# Patient Record
Sex: Male | Born: 1940 | Race: White | Hispanic: No | Marital: Married | Smoking: Never smoker
Health system: Southern US, Community
[De-identification: ages and names within clinical notes are randomized; demographics above are authoritative.]

## PROBLEM LIST (undated history)

## (undated) DIAGNOSIS — R0989 Other specified symptoms and signs involving the circulatory and respiratory systems: Secondary | ICD-10-CM

## (undated) DIAGNOSIS — K219 Gastro-esophageal reflux disease without esophagitis: Secondary | ICD-10-CM

## (undated) DIAGNOSIS — I2510 Atherosclerotic heart disease of native coronary artery without angina pectoris: Secondary | ICD-10-CM

## (undated) DIAGNOSIS — E785 Hyperlipidemia, unspecified: Secondary | ICD-10-CM

## (undated) DIAGNOSIS — M1990 Unspecified osteoarthritis, unspecified site: Secondary | ICD-10-CM

## (undated) DIAGNOSIS — I10 Essential (primary) hypertension: Secondary | ICD-10-CM

## (undated) HISTORY — DX: Other specified symptoms and signs involving the circulatory and respiratory systems: R09.89

## (undated) HISTORY — PX: COLONOSCOPY: SHX174

## (undated) HISTORY — PX: CARDIAC CATHETERIZATION: SHX172

## (undated) HISTORY — DX: Atherosclerotic heart disease of native coronary artery without angina pectoris: I25.10

## (undated) HISTORY — PX: JOINT REPLACEMENT: SHX530

## (undated) HISTORY — PX: TONSILLECTOMY: SUR1361

## (undated) HISTORY — DX: Essential (primary) hypertension: I10

## (undated) HISTORY — DX: Hyperlipidemia, unspecified: E78.5

## (undated) NOTE — *Deleted (*Deleted)
***In Progress*** PCP:  Tisovec, Adelfa Koh, MD           Cardiologist:  No primary care provider on file. Primary HF: Dr. Gala Romney  HPI:  [name] a 28 y.o.malewith history of CAD, multiple angioplasties to the RCA dating back to 1983 followed by CABG in 2012, HTN, and hyperlipidemia. 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  He was hospitalized in November 2011 because of recurrent chest pain. He underwent drug-eluting stent to the proximal RCA July 24, 2010. Ejection fraction was 65%. He also had 99% stenosis in the distal PDA with left to right collaterals and nonobstructive disease in the left coronary tree.  He was hospitalized in October 2012 with [place]. Found to have progressive CAD. Underwent Coronary artery bypass grafting x4 (left internal mammary artery to LAD, saphenous vein graft to circumflex marginal, saphenous vein graft to posterior descending branch of the distal right coronary, saphenous vein graft to the posterolateral branch of the distal right coronary artery).   Had recurrent CP in 8/20 underwent cath which showed stable anatomy with no targets for PCI.  He is s/p R hip replacement on 04/22/20. He was discharged on 04/23/20. On 04/25/20, he had AMS and was sent to [facility] ED. HS Trop 111-->1421 -> 17,390. It was unclear if this was demand ischemia. Had LHC on 04/28/20 with no targets for revascularization and medical management. ECHO showed EF 40-45%.  He recently returned for follow up with Dr. Gala Romney on 06/30/20. He reported feeling good with no CP or SOB. He was still struggling with pain from his hernia repair and knee replacement. Did not have edema, orthopnea or PND. His losartan was switched to Entresto and metoprolol tartrate was decreased due to bradycardia. (BP 190/60, HR 55)  - Check BMET - Plan A: Increase spiro, switch metop to coreg - Plan B: SGLT2 - Plan C: stop amlodipine when BP is more  controlled - [employer] - F/u: 1 week labs if increasing spiro, after TG if SGLT2  Today he returns to HF clinic for pharmacist medication titration. At last visit with MD ***.   Overall feeling ***. Dizziness, lightheadedness, fatigue:  Chest pain or palpitations:  How is your breathing?: *** SOB: Able to complete all ADLs. Activity level ***  Weight at home pounds. Takes furosemide/torsemide/bumex *** mg *** daily.  LEE PND/Orthopnea  Appetite *** Low-salt diet:   Physical Exam Cost/affordability of meds - [employer]  HF Medications: Metoprolol tartrate 25 mg BID Entresto 97/103 mg BID  Spironolactone 12.5 mg daily  - Amlodipine 10 mg daily  Has the patient been experiencing any side effects to the medications prescribed?  {YES NO:22349}  Does the patient have any problems obtaining medications due to transportation or finances?   {YES NO:22349}  Understanding of regimen: {excellent/good/fair/poor:19665} Understanding of indications: {excellent/good/fair/poor:19665} Potential of compliance: {excellent/good/fair/poor:19665} Patient understands to avoid NSAIDs. Patient understands to avoid decongestants.    Pertinent Lab Values: . Serum creatinine ***, BUN ***, Potassium ***, Sodium ***, BNP ***, Magnesium ***, Digoxin ***   Vital Signs: . Weight: *** (last clinic weight: ***) . Blood pressure: ***  . Heart rate: ***   Assessment: 1. CAD S/p CABG 2012 - Cath 04/2020 with stable revascularization - No s/s angina - Continue statin and ASA. Decrease b-blocker due to bradycardia  2. 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  Mild volume overload - Continue Entresto 97/103 mg BID - Continue*** spironolactone ***12.5 mg daily - Continue*** metoprolol tartrate ***25 mg BID - F/u with PharmD in 2-3 weeks to consider addition of SGLT2i - Wear compression hose  3. HTN - Blood pressure very high  (missed meds this am) - Changes as above  4. Carotid stenosis - Stable 40-59% disease bilateral carotids. CCA with >50% hemodynamically significant plaque. -Remains asymptomatic.Follows with Dr. Myra Gianotti  5. HLD - Continue atorvastatin 80 mg daily  - LDL 66 in 8/21. Followed by Dr. Wylene Simmer   Plan: 1) Medication changes: Based on clinical presentation, vital signs and recent labs will *** 2) Labs: *** 3) Follow-up: ***   Karle Plumber, PharmD, BCPS, BCCP, CPP Heart Failure Clinic Pharmacist [phone]

---

## 1998-08-12 ENCOUNTER — Encounter: Payer: Self-pay | Admitting: *Deleted

## 1998-08-12 ENCOUNTER — Ambulatory Visit (HOSPITAL_COMMUNITY): Admission: RE | Admit: 1998-08-12 | Discharge: 1998-08-13 | Payer: Self-pay | Admitting: *Deleted

## 2004-07-22 ENCOUNTER — Ambulatory Visit: Payer: Self-pay | Admitting: *Deleted

## 2004-09-18 ENCOUNTER — Ambulatory Visit: Payer: Self-pay

## 2004-09-28 ENCOUNTER — Ambulatory Visit: Payer: Self-pay

## 2005-02-19 ENCOUNTER — Ambulatory Visit: Payer: Self-pay | Admitting: *Deleted

## 2005-02-26 ENCOUNTER — Ambulatory Visit: Payer: Self-pay | Admitting: Cardiology

## 2005-03-22 ENCOUNTER — Ambulatory Visit: Payer: Self-pay | Admitting: *Deleted

## 2005-06-18 ENCOUNTER — Ambulatory Visit: Payer: Self-pay

## 2005-08-20 ENCOUNTER — Ambulatory Visit: Payer: Self-pay | Admitting: Cardiology

## 2005-11-08 ENCOUNTER — Encounter: Admission: RE | Admit: 2005-11-08 | Discharge: 2005-11-08 | Payer: Self-pay | Admitting: Orthopedic Surgery

## 2005-11-26 ENCOUNTER — Ambulatory Visit: Payer: Self-pay

## 2005-11-29 ENCOUNTER — Ambulatory Visit: Payer: Self-pay | Admitting: Internal Medicine

## 2006-01-05 ENCOUNTER — Inpatient Hospital Stay (HOSPITAL_COMMUNITY): Admission: RE | Admit: 2006-01-05 | Discharge: 2006-01-08 | Payer: Self-pay | Admitting: Orthopedic Surgery

## 2006-05-13 ENCOUNTER — Ambulatory Visit: Payer: Self-pay | Admitting: *Deleted

## 2006-05-23 ENCOUNTER — Ambulatory Visit: Payer: Self-pay | Admitting: *Deleted

## 2006-07-18 ENCOUNTER — Ambulatory Visit: Payer: Self-pay

## 2006-09-13 HISTORY — PX: OTHER SURGICAL HISTORY: SHX169

## 2006-11-11 ENCOUNTER — Ambulatory Visit: Payer: Self-pay | Admitting: *Deleted

## 2006-11-11 LAB — CONVERTED CEMR LAB
ALT: 25 units/L (ref 0–40)
AST: 28 units/L (ref 0–37)
Albumin: 3.8 g/dL (ref 3.5–5.2)
Alkaline Phosphatase: 50 units/L (ref 39–117)
BUN: 11 mg/dL (ref 6–23)
Bilirubin, Direct: 0.3 mg/dL (ref 0.0–0.3)
CO2: 29 meq/L (ref 19–32)
Calcium: 9.1 mg/dL (ref 8.4–10.5)
Chloride: 102 meq/L (ref 96–112)
Cholesterol: 148 mg/dL (ref 0–200)
Creatinine, Ser: 1 mg/dL (ref 0.4–1.5)
GFR calc Af Amer: 97 mL/min
GFR calc non Af Amer: 80 mL/min
Glucose, Bld: 78 mg/dL (ref 70–99)
HDL: 53.5 mg/dL (ref >39.0)
LDL Cholesterol: 85 mg/dL (ref 0–99)
Potassium: 4.3 meq/L (ref 3.5–5.1)
Sodium: 137 meq/L (ref 135–145)
Total Bilirubin: 1.4 mg/dL — ABNORMAL HIGH (ref 0.3–1.2)
Total CHOL/HDL Ratio: 2.8
Total Protein: 6.4 g/dL (ref 6.0–8.3)
Triglycerides: 49 mg/dL (ref 0–149)
VLDL: 10 mg/dL (ref 0–40)

## 2006-11-18 ENCOUNTER — Ambulatory Visit: Payer: Self-pay | Admitting: *Deleted

## 2006-11-28 ENCOUNTER — Ambulatory Visit: Payer: Self-pay | Admitting: Internal Medicine

## 2007-01-23 ENCOUNTER — Ambulatory Visit: Payer: Self-pay | Admitting: *Deleted

## 2007-01-23 LAB — CONVERTED CEMR LAB
ALT: 25 units/L (ref 0–40)
AST: 26 units/L (ref 0–37)
Albumin: 3.9 g/dL (ref 3.5–5.2)
Alkaline Phosphatase: 38 units/L — ABNORMAL LOW (ref 39–117)
BUN: 9 mg/dL (ref 6–23)
Bilirubin, Direct: 0.2 mg/dL (ref 0.0–0.3)
CO2: 29 meq/L (ref 19–32)
Calcium: 9.3 mg/dL (ref 8.4–10.5)
Chloride: 106 meq/L (ref 96–112)
Cholesterol: 135 mg/dL (ref 0–200)
Creatinine, Ser: 0.8 mg/dL (ref 0.4–1.5)
GFR calc Af Amer: 125 mL/min
GFR calc non Af Amer: 103 mL/min
Glucose, Bld: 114 mg/dL — ABNORMAL HIGH (ref 70–99)
HDL: 48 mg/dL (ref >39.0)
LDL Cholesterol: 68 mg/dL (ref 0–99)
Potassium: 4.2 meq/L (ref 3.5–5.1)
Sodium: 140 meq/L (ref 135–145)
Total Bilirubin: 1.5 mg/dL — ABNORMAL HIGH (ref 0.3–1.2)
Total CHOL/HDL Ratio: 2.8
Total Protein: 6.3 g/dL (ref 6.0–8.3)
Triglycerides: 96 mg/dL (ref 0–149)
VLDL: 19 mg/dL (ref 0–40)

## 2007-01-30 ENCOUNTER — Ambulatory Visit: Payer: Self-pay | Admitting: Internal Medicine

## 2007-07-21 ENCOUNTER — Ambulatory Visit: Payer: Self-pay | Admitting: Internal Medicine

## 2007-07-21 LAB — CONVERTED CEMR LAB
ALT: 25 units/L (ref 0–53)
AST: 29 units/L (ref 0–37)
Albumin: 3.8 g/dL (ref 3.5–5.2)
Alkaline Phosphatase: 46 units/L (ref 39–117)
Bilirubin, Direct: 0.3 mg/dL (ref 0.0–0.3)
Cholesterol: 117 mg/dL (ref 0–200)
HDL: 51 mg/dL (ref >39.0)
LDL Cholesterol: 54 mg/dL (ref 0–99)
Total Bilirubin: 1.9 mg/dL — ABNORMAL HIGH (ref 0.3–1.2)
Total CHOL/HDL Ratio: 2.3
Total Protein: 6.3 g/dL (ref 6.0–8.3)
Triglycerides: 59 mg/dL (ref 0–149)
VLDL: 12 mg/dL (ref 0–40)

## 2007-07-31 ENCOUNTER — Ambulatory Visit: Payer: Self-pay | Admitting: Cardiology

## 2007-07-31 ENCOUNTER — Ambulatory Visit: Payer: Self-pay

## 2007-08-18 ENCOUNTER — Ambulatory Visit: Payer: Self-pay | Admitting: Internal Medicine

## 2008-03-04 ENCOUNTER — Ambulatory Visit: Payer: Self-pay | Admitting: Internal Medicine

## 2008-09-15 ENCOUNTER — Encounter: Payer: Self-pay | Admitting: Internal Medicine

## 2008-09-16 ENCOUNTER — Ambulatory Visit: Payer: Self-pay | Admitting: Internal Medicine

## 2008-09-16 LAB — CONVERTED CEMR LAB
ALT: 28 units/L (ref 0–53)
AST: 28 units/L (ref 0–37)
Albumin: 4.1 g/dL (ref 3.5–5.2)
Alkaline Phosphatase: 55 units/L (ref 39–117)
BUN: 13 mg/dL (ref 6–23)
Bilirubin, Direct: 0.2 mg/dL (ref 0.0–0.3)
CO2: 29 meq/L (ref 19–32)
Calcium: 9.4 mg/dL (ref 8.4–10.5)
Chloride: 104 meq/L (ref 96–112)
Cholesterol: 179 mg/dL (ref 0–200)
Creatinine, Ser: 0.8 mg/dL (ref 0.4–1.5)
GFR calc Af Amer: 125 mL/min
GFR calc non Af Amer: 103 mL/min
Glucose, Bld: 101 mg/dL — ABNORMAL HIGH (ref 70–99)
HDL: 66.4 mg/dL (ref >39.0)
LDL Cholesterol: 100 mg/dL — ABNORMAL HIGH (ref 0–99)
Potassium: 4.3 meq/L (ref 3.5–5.1)
Sodium: 140 meq/L (ref 135–145)
Total Bilirubin: 1.8 mg/dL — ABNORMAL HIGH (ref 0.3–1.2)
Total CHOL/HDL Ratio: 2.7
Total Protein: 6.6 g/dL (ref 6.0–8.3)
Triglycerides: 61 mg/dL (ref 0–149)
VLDL: 12 mg/dL (ref 0–40)

## 2008-10-21 ENCOUNTER — Ambulatory Visit: Payer: Self-pay | Admitting: Cardiology

## 2009-02-24 ENCOUNTER — Ambulatory Visit: Payer: Self-pay | Admitting: Internal Medicine

## 2009-02-24 LAB — CONVERTED CEMR LAB
ALT: 25 units/L (ref 0–53)
AST: 28 units/L (ref 0–37)
Albumin: 3.7 g/dL (ref 3.5–5.2)
Alkaline Phosphatase: 58 units/L (ref 39–117)
Bilirubin, Direct: 0.1 mg/dL (ref 0.0–0.3)
Cholesterol: 160 mg/dL (ref 0–200)
HDL: 58.4 mg/dL (ref >39.00)
LDL Cholesterol: 89 mg/dL (ref 0–99)
Total Bilirubin: 1.6 mg/dL — ABNORMAL HIGH (ref 0.3–1.2)
Total CHOL/HDL Ratio: 3
Total Protein: 6.5 g/dL (ref 6.0–8.3)
Triglycerides: 64 mg/dL (ref 0.0–149.0)
VLDL: 12.8 mg/dL (ref 0.0–40.0)

## 2009-03-03 ENCOUNTER — Ambulatory Visit: Payer: Self-pay | Admitting: Internal Medicine

## 2009-03-03 DIAGNOSIS — I10 Essential (primary) hypertension: Secondary | ICD-10-CM | POA: Insufficient documentation

## 2009-03-03 DIAGNOSIS — E785 Hyperlipidemia, unspecified: Secondary | ICD-10-CM | POA: Insufficient documentation

## 2009-03-03 DIAGNOSIS — I2510 Atherosclerotic heart disease of native coronary artery without angina pectoris: Secondary | ICD-10-CM | POA: Insufficient documentation

## 2009-03-03 DIAGNOSIS — R0989 Other specified symptoms and signs involving the circulatory and respiratory systems: Secondary | ICD-10-CM | POA: Insufficient documentation

## 2009-03-04 ENCOUNTER — Encounter: Payer: Self-pay | Admitting: Internal Medicine

## 2009-03-10 ENCOUNTER — Encounter: Payer: Self-pay | Admitting: Internal Medicine

## 2009-03-10 ENCOUNTER — Ambulatory Visit: Payer: Self-pay

## 2009-04-15 ENCOUNTER — Ambulatory Visit: Payer: Self-pay | Admitting: Internal Medicine

## 2009-04-21 ENCOUNTER — Ambulatory Visit: Payer: Self-pay | Admitting: Cardiology

## 2009-04-22 LAB — CONVERTED CEMR LAB
ALT: 28 units/L (ref 0–53)
AST: 32 units/L (ref 0–37)
Albumin: 3.8 g/dL (ref 3.5–5.2)
Alkaline Phosphatase: 61 units/L (ref 39–117)
Bilirubin, Direct: 0.3 mg/dL (ref 0.0–0.3)
Cholesterol: 141 mg/dL (ref 0–200)
HDL: 50.4 mg/dL (ref >39.00)
LDL Cholesterol: 79 mg/dL (ref 0–99)
Total Bilirubin: 1.8 mg/dL — ABNORMAL HIGH (ref 0.3–1.2)
Total CHOL/HDL Ratio: 3
Total Protein: 6.5 g/dL (ref 6.0–8.3)
Triglycerides: 58 mg/dL (ref 0.0–149.0)
VLDL: 11.6 mg/dL (ref 0.0–40.0)

## 2009-11-03 ENCOUNTER — Ambulatory Visit: Payer: Self-pay | Admitting: Internal Medicine

## 2009-11-07 ENCOUNTER — Encounter: Payer: Self-pay | Admitting: Internal Medicine

## 2009-11-07 LAB — CONVERTED CEMR LAB
ALT: 22 units/L (ref 0–53)
AST: 25 units/L (ref 0–37)
Albumin: 3.8 g/dL (ref 3.5–5.2)
Alkaline Phosphatase: 57 units/L (ref 39–117)
Bilirubin, Direct: 0.2 mg/dL (ref 0.0–0.3)
Cholesterol: 154 mg/dL (ref 0–200)
HDL: 61.7 mg/dL (ref >39.00)
LDL Cholesterol: 73 mg/dL (ref 0–99)
Total Bilirubin: 1.2 mg/dL (ref 0.3–1.2)
Total CHOL/HDL Ratio: 2
Total Protein: 6.5 g/dL (ref 6.0–8.3)
Triglycerides: 96 mg/dL (ref 0.0–149.0)
VLDL: 19.2 mg/dL (ref 0.0–40.0)

## 2009-11-17 ENCOUNTER — Ambulatory Visit: Payer: Self-pay | Admitting: Internal Medicine

## 2010-02-12 ENCOUNTER — Ambulatory Visit: Payer: Self-pay | Admitting: Internal Medicine

## 2010-05-11 ENCOUNTER — Ambulatory Visit: Payer: Self-pay | Admitting: Internal Medicine

## 2010-05-20 LAB — CONVERTED CEMR LAB
ALT: 22 units/L (ref 0–53)
AST: 28 units/L (ref 0–37)
Albumin: 3.9 g/dL (ref 3.5–5.2)
Alkaline Phosphatase: 51 units/L (ref 39–117)
Bilirubin, Direct: 0.2 mg/dL (ref 0.0–0.3)
Cholesterol: 164 mg/dL (ref 0–200)
HDL: 59.4 mg/dL (ref >39.00)
LDL Cholesterol: 91 mg/dL (ref 0–99)
Total Bilirubin: 1.6 mg/dL — ABNORMAL HIGH (ref 0.3–1.2)
Total CHOL/HDL Ratio: 3
Total Protein: 6.3 g/dL (ref 6.0–8.3)
Triglycerides: 70 mg/dL (ref 0.0–149.0)
VLDL: 14 mg/dL (ref 0.0–40.0)

## 2010-07-14 HISTORY — PX: CORONARY STENT PLACEMENT: SHX1402

## 2010-07-20 ENCOUNTER — Telehealth: Payer: Self-pay | Admitting: Internal Medicine

## 2010-07-22 ENCOUNTER — Ambulatory Visit: Payer: Self-pay | Admitting: Internal Medicine

## 2010-07-22 DIAGNOSIS — R0789 Other chest pain: Secondary | ICD-10-CM | POA: Insufficient documentation

## 2010-07-23 LAB — CONVERTED CEMR LAB
BUN: 11 mg/dL (ref 6–23)
Basophils Absolute: 0 10*3/uL (ref 0.0–0.1)
Basophils Relative: 0.6 % (ref 0.0–3.0)
CO2: 30 meq/L (ref 19–32)
Calcium: 9.3 mg/dL (ref 8.4–10.5)
Chloride: 103 meq/L (ref 96–112)
Creatinine, Ser: 0.7 mg/dL (ref 0.4–1.5)
Eosinophils Absolute: 0.3 10*3/uL (ref 0.0–0.7)
Eosinophils Relative: 3.9 % (ref 0.0–5.0)
GFR calc non Af Amer: 130.03 mL/min (ref >60)
Glucose, Bld: 96 mg/dL (ref 70–99)
HCT: 43.2 % (ref 39.0–52.0)
Hemoglobin: 15.1 g/dL (ref 13.0–17.0)
INR: 0.9 (ref 0.8–1.0)
Lymphocytes Relative: 25.3 % (ref 12.0–46.0)
Lymphs Abs: 1.7 10*3/uL (ref 0.7–4.0)
MCHC: 35.1 g/dL (ref 30.0–36.0)
MCV: 102.2 fL — ABNORMAL HIGH (ref 78.0–100.0)
Monocytes Absolute: 0.7 10*3/uL (ref 0.1–1.0)
Monocytes Relative: 10.9 % (ref 3.0–12.0)
Neutro Abs: 4 10*3/uL (ref 1.4–7.7)
Neutrophils Relative %: 59.3 % (ref 43.0–77.0)
Platelets: 207 10*3/uL (ref 150.0–400.0)
Potassium: 4.1 meq/L (ref 3.5–5.1)
Prothrombin Time: 9.7 s (ref 9.7–11.8)
RBC: 4.22 M/uL (ref 4.22–5.81)
RDW: 13.5 % (ref 11.5–14.6)
Sodium: 139 meq/L (ref 135–145)
WBC: 6.8 10*3/uL (ref 4.5–10.5)

## 2010-07-24 ENCOUNTER — Ambulatory Visit: Payer: Self-pay | Admitting: Internal Medicine

## 2010-07-24 ENCOUNTER — Ambulatory Visit: Payer: Self-pay | Admitting: Cardiology

## 2010-07-24 ENCOUNTER — Inpatient Hospital Stay (HOSPITAL_BASED_OUTPATIENT_CLINIC_OR_DEPARTMENT_OTHER): Admission: RE | Admit: 2010-07-24 | Discharge: 2010-07-24 | Payer: Self-pay | Admitting: Internal Medicine

## 2010-07-24 ENCOUNTER — Ambulatory Visit (HOSPITAL_COMMUNITY): Admission: RE | Admit: 2010-07-24 | Discharge: 2010-07-25 | Payer: Self-pay | Admitting: Cardiology

## 2010-08-10 ENCOUNTER — Ambulatory Visit: Payer: Self-pay | Admitting: Cardiology

## 2010-08-10 ENCOUNTER — Encounter: Payer: Self-pay | Admitting: Physician Assistant

## 2010-10-13 NOTE — Letter (Signed)
Summary: [facility] - Lipid  [facility], Main Office  [address]   [place], [place] [address]   Phone: [phone]  Fax: [phone]     November 07, 2009 MRN: [id number]   [name] [address] [place], [place]  [address]   Dear [name],  We have reviewed your cholesterol results.  They are as follows:     Total Cholesterol:    154 (Desirable: less than 200)       HDL  Cholesterol:     61.70  (Desirable: greater than 40 for men and 50 for women)       LDL Cholesterol:       73  (Desirable: less than 100 for low risk and less than 70 for moderate to high risk)       Triglycerides:       96.0  (Desirable: less than 150)  Our recommendations include: Looks Good.   Call our office at the number listed above if you have any questions.  Lowering your LDL cholesterol is important, but it is only one of a large number of "risk factors" that may indicate that you are at risk for heart disease, stroke or other complications of hardening of the arteries.  Other risk factors include:   A.  Cigarette Smoking* B.  High Blood Pressure* C.  Obesity* D.   Low HDL Cholesterol (see yours above)* E.   Diabetes Mellitus (higher risk if your is uncontrolled) F.  Family history of premature heart disease G.  Previous history of stroke or cardiovascular disease    *These are risk factors YOU HAVE CONTROL OVER.  For more information, visit .  There is now evidence that lowering the TOTAL CHOLESTEROL AND LDL CHOLESTEROL can reduce the risk of heart disease.  The American Heart Association recommends the following guidelines for the treatment of elevated cholesterol:  1.  If there is now current heart disease and less than two risk factors, TOTAL CHOLESTEROL should be less than 200 and LDL CHOLESTEROL should be less than 100. 2.  If there is current heart disease or two or more risk factors, TOTAL CHOLESTEROL should be less than 200 and LDL CHOLESTEROL should be less than  70.  A diet low in cholesterol, saturated fat, and calories is the cornerstone of treatment for elevated cholesterol.  Cessation of smoking and exercise are also important in the management of elevated cholesterol and preventing vascular disease.  Studies have shown that 30 to 60 minutes of physical activity most days can help lower blood pressure, lower cholesterol, and keep your weight at a healthy level.  Drug therapy is used when cholesterol levels do not respond to therapeutic lifestyle changes (smoking cessation, diet, and exercise) and remains unacceptably high.  If medication is started, it is important to have you levels checked periodically to evaluate the need for further treatment options.  Thank you,    [employer] Team

## 2010-10-13 NOTE — Assessment & Plan Note (Signed)
Summary: eph. / gd  Medications Added ASPIRIN EC 325 MG TBEC (ASPIRIN) Take one tablet by mouth daily AMLODIPINE BESYLATE 10 MG TABS (AMLODIPINE BESYLATE) Take one tablet by mouth daily PLAVIX 75 MG TABS (CLOPIDOGREL BISULFATE) Take one tablet by mouth daily      Allergies Added:   Visit Type:  Follow-up Primary Provider:  Ernesta Amble  CC:  no complaints since hosp.  History of Present Illness: This is a 70 year old white male patient who was recently hospitalized because of recurrent chest pain. He underwent drug-eluting stent to the proximal RCA July 24, 2010. Ejection fraction was 65%. He also had 99% stenosis in the distal PDA with left to right collaterals and nonobstructive disease in the left coronary tree.  The patient denies any further chest pain since his stent was placed. He is frustrated they've had progression of his coronary artery disease despite controlling all his risk factors. He walks several miles twice a day several days a week. He follows a strict diet and does not smoke.  Current Medications (verified): 1)  Aspirin Ec 325 Mg Tbec (Aspirin) .... Take One Tablet By Mouth Daily 2)  Lipitor 40 Mg Tabs (Atorvastatin Calcium) .... Take One Tablet By Mouth Daily. 3)  Amlodipine Besylate 10 Mg Tabs (Amlodipine Besylate) .... Take One Tablet By Mouth Daily 4)  Niaspan 1000 Mg Cr-Tabs (Niacin (Antihyperlipidemic)) .... 2 Tablets At Bedtime 5)  Flomax 0.4 Mg Xr24h-Cap (Tamsulosin Hcl) .... Alternate Days of 1 Tablet With 2 Tablets 6)  Glucosamine Hcl 1000 Mg Tabs (Glucosamine Hcl) .... Take One Tablet By Mouth Once Daily. 7)  Fish Oil Double Strength 1200 Mg Caps (Omega-3 Fatty Acids) .... 2 Caps Daily 8)  Tylenol Extra Strength 500 Mg Tabs (Acetaminophen) .... 1000mg  At Bedtime As Needed 9)  Ramipril 5 Mg Caps (Ramipril) .... Take One Capsule By Mouth Daily 10)  Nitrostat 0.4 Mg Subl (Nitroglycerin) .Marland Kitchen.. 1 Tablet Under Tongue At Onset of Chest Pain; You May Repeat  Every 5 Minutes For Up To 3 Doses. 11)  Vitamin B Complex-C   Caps (B Complex-C) .... Once Daily 12)  Plavix 75 Mg Tabs (Clopidogrel Bisulfate) .... Take One Tablet By Mouth Daily  Allergies (verified): 1)  ! Morphine 2)  ! * Apples  Past History:  Past Medical History: Last updated: 02/12/2010 CAD s/ps multiple PCIs    -Myoview 62%. Normal. Apical thinning. 2007 HTN HL - followed in Lipid Clinic.     --LFTs went up on Lipitor 80 R carotid bruit    --u/s 40-59% B (6/10)   Past Surgical History: Last updated: 02/26/2009  DATE OF PROCEDURE:  01/05/2006  PHYSICIAN:  Molly Maduro A. Thurston Hole, M.D.  PROCEDURE:  Left total hip replacement using S-ROM Press-Fit metal-on-metal  total hip system with acetabulum component 56 mm Press-Fit Sector acetabular  shell with one 20 mm locking screw and 56 mm metal insert.  Femoral  component 16 x 11 Press-Fit femoral stem with 36+6 femoral neck with 16D  large sleeve and 36+0 femoral head.  Review of Systems       see history of present illness  Vital Signs:  Patient profile:   70 year old male Height:      67 inches Weight:      198 pounds BMI:     31.12 Pulse rate:   75 / minute BP sitting:   134 / 62  (right arm) Cuff size:   regular  Vitals Entered By: Hardin Negus, RMA (August 10, 2010 11:51  AM)  Physical Exam  General:   Well-nournished, in no acute distress. Neck: No JVD, HJR, Bruit, or thyroid enlargement Lungs: No tachypnea, clear without wheezing, rales, or rhonchi Cardiovascular: RRR, PMI not displaced, heart sounds normal, no murmurs, gallops, bruit, thrill, or heave. Abdomen: BS normal. Soft without organomegaly, masses, lesions or tenderness. Extremities:right groin without hematoma or hemorrhage, lower extremities without cyanosis, clubbing or edema. Good distal pulses bilateral SKin: Warm, no lesions or rashes  Musculoskeletal: No deformities Neuro: no focal signs    Impression & Recommendations:  Problem # 1:   CAD, NATIVE VESSEL (ICD-414.01) Patient underwent drug-eluting stent to the proximal RCA July 24, 2010. He is walking again and has no further chest pain. See history of present illness for details of cardiac catheterization. His updated medication list for this problem includes:    Aspirin Ec 325 Mg Tbec (Aspirin) .Marland Kitchen... Take one tablet by mouth daily    Amlodipine Besylate 10 Mg Tabs (Amlodipine besylate) .Marland Kitchen... Take one tablet by mouth daily    Ramipril 5 Mg Caps (Ramipril) .Marland Kitchen... Take one capsule by mouth daily    Nitrostat 0.4 Mg Subl (Nitroglycerin) .Marland Kitchen... 1 tablet under tongue at onset of chest pain; you may repeat every 5 minutes for up to 3 doses.    Plavix 75 Mg Tabs (Clopidogrel bisulfate) .Marland Kitchen... Take one tablet by mouth daily  Orders: EKG w/ Interpretation (93000)  Problem # 2:  HYPERLIPIDEMIA (ICD-272.4) lipid profile is stable, last checked May 11, 2010 His updated medication list for this problem includes:    Lipitor 40 Mg Tabs (Atorvastatin calcium) .Marland Kitchen... Take one tablet by mouth daily.    Niaspan 1000 Mg Cr-tabs (Niacin (antihyperlipidemic)) .Marland Kitchen... 2 tablets at bedtime  Problem # 3:  CAROTID BRUIT, RIGHT (ICD-785.9) carotid Dopplers last checked June 2010. At that time he had 40-59% bilateral ICA. Recommended followup in 2 years. He will be due for carotid in June 2012.  Problem # 4:  HYPERTENSION, BENIGN (ICD-401.1) blood pressure is stable His updated medication list for this problem includes:    Aspirin Ec 325 Mg Tbec (Aspirin) .Marland Kitchen... Take one tablet by mouth daily    Amlodipine Besylate 10 Mg Tabs (Amlodipine besylate) .Marland Kitchen... Take one tablet by mouth daily    Ramipril 5 Mg Caps (Ramipril) .Marland Kitchen... Take one capsule by mouth daily  Patient Instructions: 1)  Your physician recommends that you schedule a follow-up appointment in: Dr.Dan Bensimhon in 2 to 3 months

## 2010-10-13 NOTE — Assessment & Plan Note (Signed)
Summary: 3-4 MONTH ROV/SL  Medications Added FLOMAX 0.4 MG XR24H-CAP (TAMSULOSIN HCL) alternate days of 1 tablet with 2 tablets RAMIPRIL 5 MG CAPS (RAMIPRIL) Take one capsule by mouth daily        Primary Provider:  Ernesta Amble   History of Present Illness: [name] is a very pleasant 70 year old male previously followed by Dr. Graceann Congress.  He has a history of coronary artery disease.  He has multiple angioplasties dating back to 57.  He also has a history of hypertension and hyperlipidemia and is followed by the Lipid Clinic.  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 He returns today for routine f/u.  He is very active and working 30-40 hours a week and he walks 2-3 times a week with no chest pain or shortness of breath.  He has been checking his blood pressure occasionally, systolics are usually in the 150-155 with diastolics 70-75 range. No palpitations or focal neuro symptoms.  Lipids from last week  TC 160 TG 64 HDL 58 LDL 89 Continues with chronic back pain.  Current Medications (verified): 1)  Aspirin 81 Mg Tbec (Aspirin) .... Take 2 Tablets By Mouth Daily 2)  Lipitor 40 Mg Tabs (Atorvastatin Calcium) .... Take One Tablet By Mouth Daily. 3)  Amlodipine Besylate 10 Mg Tabs (Amlodipine Besylate) .... Take One Tablet By Mouth Daily 4)  Niaspan 1000 Mg Cr-Tabs (Niacin (Antihyperlipidemic)) .... 2 Tablets At Bedtime 5)  Flomax 0.4 Mg Xr24h-Cap (Tamsulosin Hcl) .... Alternate Days of 1 Tablet With 2 Tablets 6)  Glucosamine Hcl 1000 Mg Tabs (Glucosamine Hcl) .... Take One Tablet By Mouth Once Daily. 7)  Fish Oil   Oil (Fish Oil) .... 2000mg   Daily 8)  Co-Enzyme Q-10 10 Mg Caps (Coenzyme Q10) .... Daliy 9)  Tylenol Extra Strength 500 Mg Tabs (Acetaminophen) .... 1000mg  At Bedtime As Needed  Allergies: 1)  ! Morphine 2)  ! * Apples  Past History:  Past Medical History: CAD s/ps multiple PCIs    -Myoview 62%. Normal. Apical  thinning. 2007 HTN HL - followed in Lipid Clinic.     --LFTs went up on Lipitor 80 R carotid bruit  Review of Systems       As per HPI and past medical history; otherwise all systems negative.   Vital Signs:  Patient profile:   70 year old male Height:      68 inches Weight:      194 pounds BMI:     29.60 Pulse rate:   74 / minute BP sitting:   146 / 76  (left arm)  Vitals Entered By: Laurance Flatten CMA (March 03, 2009 10:15 AM)  Physical Exam  General:  Gen: well appearing. no resp difficulty HEENT: normal Neck: supple. no JVD. Carotids 2+ bilat; + R bruit. No lymphadenopathy or thryomegaly appreciated. Cor: PMI nondisplaced. Regular rate & rhythm. No rubs, gallops, murmur. Lungs: clear Abdomen: soft, nontender, nondistended. No hepatosplenomegaly. No bruits or masses. Good bowel sounds. Extremities: no cyanosis, clubbing, rash, edema Neuro: alert & orientedx3, cranial nerves grossly intact. moves all 4 extremities w/o difficulty. affect pleasant    Impression & Recommendations:  Problem # 1:  CAD, NATIVE VESSEL (ICD-414.01) Stable. No evidence of ischemia. Continue current regimen.  Problem # 2:  HYPERLIPIDEMIA (ICD-272.4) LDL not quite at goal (<70). Considered switching to crestor 40 or increasing to lipitor 80 but this hasn't worked for him in past. Will f/u in Lipid Clinic soon.  Problem # 3:  HYPERTENSION, BENIGN (ICD-401.1) Not well controlled. doesn't want b-blocker due to fatigue. 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 Will start ramipril 5mg  once daily. BMET in 1 week with PCP.  Problem # 4:  CAROTID BRUIT, RIGHT (ICD-785.9) Check carotid u/s.  Other Orders: EKG w/ Interpretation (93000) Carotid Duplex (Carotid Duplex)  Patient Instructions: 1)  Start Ramipril 5mg  daily 2)  Your physician has requested that you have a carotid duplex. This test is an ultrasound of the carotid arteries in your neck. It looks at blood flow  through these arteries that supply the brain with blood. Allow one hour for this exam. There are no restrictions or special instructions. 3)  Labs--bmet 414.01, to be done with your primary care MD next week 4)  Follow up in 9 months Prescriptions: RAMIPRIL 5 MG CAPS (RAMIPRIL) Take one capsule by mouth daily  #30 x 6   Entered by:   Meredith Staggers, RN   Authorized by:   Dolores Patty, MD, [facility]   Signed by:   Meredith Staggers, RN on 03/03/2009   Method used:   Electronically to        [facility]* (retail)       [address]       [place], [place]  [id number]       Ph: [phone]       Fax: [phone]   RxID:   [id number]

## 2010-10-13 NOTE — Assessment & Plan Note (Signed)
Summary: 9am  rov  Medications Added AMLODIPINE BESYLATE 10 MG TABS (AMLODIPINE BESYLATE) Take one tablet by mouth daily on occasion VITAMIN B COMPLEX-C   CAPS (B COMPLEX-C) once daily      Allergies Added:   Visit Type:  Follow-up Primary Provider:  Ernesta Amble  CC:  chest pain on Sunday.  History of Present Illness: [name] is a very pleasant 70 year old male previously followed by Dr. Joseph [place].  He has a history of coronary artery disease.  He has multiple angioplasties dating back to 1983 last one in 1999.  He alsohas a history of hypertension and hyperlipidemia and is followed by the Lipid Clinic.  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   Here for unscheduled visit for CP. On Sunday was sawing a branch and had 5-10 CP radiating to jaw. Pain went away with rest. No recurrent pain since. No back to usual activities without problem.   Current Medications (verified): 1)  Aspirin 81 Mg Tbec (Aspirin) .... Take 2 Tablets By Mouth Daily 2)  Lipitor 40 Mg Tabs (Atorvastatin Calcium) .... Take One Tablet By Mouth Daily. 3)  Amlodipine Besylate 10 Mg Tabs (Amlodipine Besylate) .... Take One Tablet By Mouth Daily On Occasion 4)  Niaspan 1000 Mg Cr-Tabs (Niacin (Antihyperlipidemic)) .... 2 Tablets At Bedtime 5)  Flomax 0.4 Mg Xr24h-Cap (Tamsulosin Hcl) .... Alternate Days of 1 Tablet With 2 Tablets 6)  Glucosamine Hcl 1000 Mg Tabs (Glucosamine Hcl) .... Take One Tablet By Mouth Once Daily. 7)  Fish Oil Double Strength 1200 Mg Caps (Omega-3 Fatty Acids) .... 2 Caps Daily 8)  Tylenol Extra Strength 500 Mg Tabs (Acetaminophen) .... 1000mg At Bedtime As Needed 9)  Ramipril 5 Mg Caps (Ramipril) .... Take One Capsule By Mouth Daily 10)  Nitrostat 0.4 Mg Subl (Nitroglycerin) .... 1 Tablet Under Tongue At Onset of Chest Pain; You May Repeat Every 5 Minutes For Up To 3 Doses. 11)  Vitamin B Complex-C   Caps (B Complex-C) .... Once  Daily  Allergies (verified): 1)  ! Morphine 2)  ! * Apples  Past History:  Past Medical History: Last updated: 02/12/2010 CAD s/ps multiple PCIs    -Myoview 62%. Normal. Apical thinning. 2007 HTN HL - followed in Lipid Clinic.     --LFTs went up on Lipitor 80 R carotid bruit    --u/s 40-59% B (6/10)   Review of Systems       As per HPI and past medical history; otherwise all systems negative.   Vital Signs:  Patient profile:   70 year old male Height:      67 inches Weight:      200 pounds BMI:     31 .44 Pulse rate:   77 / minute BP sitting:   168 / 88  (left arm) Cuff size:   regular  Vitals Entered By: Hardin Negus, RMA (July 22, 2010 9:11 AM)  Physical Exam  General:  Well appearing. no resp difficulty HEENT: normal Neck: supple. no JVD. Carotids 2+ bilat; +soft  R bruit. No lymphadenopathy or thryomegaly appreciated. Cor: PMI nondisplaced. Regular rate & rhythm. No rubs, murmur. +s4 Lungs: clear Abdomen: soft, nontender, nondistended. No hepatosplenomegaly. No bruits or masses. Good bowel sounds. Extremities: no cyanosis, clubbing, rash, edema. PTs 2+ bilaterally Neuro: alert & orientedx3, cranial nerves grossly intact. moves all 4 extremities w/o difficulty. affect pleasant    Impression & Recommendations:  Problem # 1:  CHEST TIGHTNESS-PRESSURE-OTHER (ICD-786.59)  Symptoms concerning for recurrent angina. Discussed cath vs stress test but given known underlying severe CAD favor cath. He agrees. Will plan cath on Friday. Call 911 if recurrent severe CP. Has NTG on hand.   Problem # 2:  CAD, NATIVE VESSEL (ICD-414.01) As above.  Other Orders: EKG w/ Interpretation (93000) TLB-PT (Protime) (85610-PTP) TLB-BMP (Basic Metabolic Panel-BMET) (80048-METABOL) TLB-CBC Platelet - w/Differential (85025-CBCD) Cardiac Catheterization (Cardiac Cath)  Patient Instructions: 1)  Your physician recommends that you return for lab work [id number]   2)  Your  physician has requested that you have a cardiac catheterization ON 07/24/10.  Cardiac catheterization is used to diagnose and/or treat various heart conditions. Doctors may recommend this procedure for a number of different reasons. The most common reason is to evaluate chest pain. Chest pain can be a symptom of coronary artery disease (CAD), and cardiac catheterization can show whether plaque is narrowing or blocking your heart's arteries. This procedure is also used to evaluate the valves, as well as measure the blood flow and oxygen levels in different parts of your heart.  For further information please visit [email or web address]  Please follow instruction sheet, as given. 3)  Your physician recommends that you schedule a follow-up appointment in: 3 MONTHS

## 2010-10-13 NOTE — Letter (Signed)
Summary: Cardiac Catheterization Instructions- [facility]  [facility] [place]  [address], [place] [address]   Phone: [phone]  Fax: [phone]     07/22/2010 MRN: [id number]  [name] [address] [place], [place]  [address]  Dear [name],   You are scheduled for a Cardiac Catheterization on 07/24/10 with Dr.Chue Berkovich Please arrive to the 1st floor of the Heart and Vascular Center at [facility] at 6:30am on the day of your procedure. Please do not arrive before 6:30 a.m. Call the Heart and Vascular Center at [phone] if you are unable to make your appointmnet. The Code to get into the parking garage under the building is2000. Take the elevators to the 1st floor. You must have someone to drive you home. Someone must be with you for the first 24 hours after you arrive home. Please wear clothes that are easy to get on and off and wear slip-on shoes. Do not eat or drink after midnight except water with your medications that morning. Bring all your medications and current insurance cards with you.  ___ DO NOT take these medications before your procedure: ________________________________________________________________  __x_ Make sure you take your aspirin.  __x_ You may take ALL of your medications with water that morning. ________________________________________________________________________________________________________________________________  ___ DO NOT take ANY medications before your procedure.  ___ Pre-med instructions:  ________________________________________________________________________________________________________________________________  The usual length of stay after your procedure is 2 to 3 hours. This can vary.  If you have any questions, please call the office at the number listed above.   Dessie Coma  LPN

## 2010-10-13 NOTE — Assessment & Plan Note (Signed)
Summary: LIPID/LIVER   [name] returns to lipid clinic.    Exercise is concentrated onto 3 days / week (Sat - Sun - Mon).  He completes walking or bicycle riding on a usual basis (though recently has been building a deck and creating a new garden bed).    Diet therapy:  [name] continues to follow a low-fat, low-cholesterol diet, though admits some indiscretions associated with business-related travel.  He has been successful at avoiding hamburgers.    Lipid Management Provider  Shelby Dubin, PharmD, BCPS, CPP  Current Medications (verified): 1)  Aspirin 81 Mg Tbec (Aspirin) .... Take 2 Tablets By Mouth Daily 2)  Lipitor 40 Mg Tabs (Atorvastatin Calcium) .... Take One Tablet By Mouth Daily. 3)  Amlodipine Besylate 10 Mg Tabs (Amlodipine Besylate) .... Take One Tablet By Mouth Daily 4)  Niaspan 1000 Mg Cr-Tabs (Niacin (Antihyperlipidemic)) .... 2 Tablets At Bedtime 5)  Flomax 0.4 Mg Xr24h-Cap (Tamsulosin Hcl) .... Alternate Days of 1 Tablet With 2 Tablets 6)  Glucosamine Hcl 1000 Mg Tabs (Glucosamine Hcl) .... Take One Tablet By Mouth Once Daily. 7)  Fish Oil Double Strength 1200 Mg Caps (Omega-3 Fatty Acids) .... 2 Caps Daily 8)  Co-Enzyme Q-10 10 Mg Caps (Coenzyme Q10) .... Daily 9)  Tylenol Extra Strength 500 Mg Tabs (Acetaminophen) .... 1000mg  At Bedtime As Needed 10)  Ramipril 5 Mg Caps (Ramipril) .... Take One Capsule By Mouth Daily  Allergies (verified): 1)  ! Morphine 2)  ! * Apples  Past History:  Past Medical History: Last updated: 03/03/2009 CAD s/ps multiple PCIs    -Myoview 62%. Normal. Apical thinning. 2007 HTN HL - followed in Lipid Clinic.     --LFTs went up on Lipitor 80 R carotid bruit  Past Surgical History: Last updated: 02/26/2009  DATE OF PROCEDURE:  01/05/2006  PHYSICIAN:  Molly Maduro A. Thurston Hole, M.D.  PROCEDURE:  Left total hip replacement using S-ROM Press-Fit metal-on-metal  total hip system with acetabulum component 56 mm Press-Fit Sector acetabular  shell with one 20 mm locking screw and 56 mm metal insert.  Femoral  component 16 x 11 Press-Fit femoral stem with 36+6 femoral neck with 16D  large sleeve and 36+0 femoral head.   Vital Signs:  Patient profile:   70 year old male Height:      67 inches Pulse rate:   68 / minute Pulse rhythm:   regular Resp:     16 per minute BP sitting:   148 / 80  (right arm)  Impression & Recommendations:  Problem # 1:  HYPERLIPIDEMIA (ICD-272.4) [name] labs reveal the following values: total cholesterol 141, triglycerides 58, HDL 50.4, LDL 79.  Liver function tests are normal except total bilirubin (1.8 mg/dl), which is always elevated for [name].  He will continue current dosing and call with changes in the meantime.  We will recheck labs in 6 months.     His updated medication list for this problem includes:    Lipitor 40 Mg Tabs (Atorvastatin calcium) .Marland Kitchen... Take one tablet by mouth daily.    Niaspan 1000 Mg Cr-tabs (Niacin (antihyperlipidemic)) .Marland Kitchen... 2 tablets at bedtime  Problem # 2:  HYPERTENSION, BENIGN (ICD-401.1) Based on lack of improvement of BP since Dr. Prescott Gum visit, I have suggested to [name] that he consider moving amlodipine to QAM dosing and continue ramipril with at bedtime dosing.  This will mirror clinical trial data, and may improve late-day blood pressure control.  His updated medication list for this problem  includes:    Aspirin 81 Mg Tbec (Aspirin) .Marland Kitchen... Take 2 tablets by mouth daily    Amlodipine Besylate 10 Mg Tabs (Amlodipine besylate) .Marland Kitchen... Take one tablet by mouth daily    Ramipril 5 Mg Caps (Ramipril) .Marland Kitchen... Take one capsule by mouth daily

## 2010-10-13 NOTE — Progress Notes (Signed)
Summary: Pt had Angina  Medications Added NITROSTAT 0.4 MG SUBL (NITROGLYCERIN) 1 tablet under tongue at onset of chest pain; you may repeat every 5 minutes for up to 3 doses.       Phone Note Call from [name] Call back at [facility] Phone [phone]   Caller: [name] Summary of Call: Pt had Angina yesterday Initial call taken by: Judie Grieve,  July 20, 2010 8:22 AM  Follow-up for Phone Call        was outside yest picking up brush dev. angina lasted only about 2-3 mins. resolved w/rest, has not had angina in years so was a little concerned, have sent in rx for ntg and will discuss w/Dr Vishaal Strollo and call him back later  Follow-up by: Meredith Staggers, RN,  July 20, 2010 1:07 PM  Additional Follow-up for Phone Call Additional follow up Details #1::        per Dr Gala Romney sch OV soon, appt sch for wed at [facility], RN  July 20, 2010 6:14 PM     New/Updated Medications: NITROSTAT 0.4 MG SUBL (NITROGLYCERIN) 1 tablet under tongue at onset of chest pain; you may repeat every 5 minutes for up to 3 doses. Prescriptions: NITROSTAT 0.4 MG SUBL (NITROGLYCERIN) 1 tablet under tongue at onset of chest pain; you may repeat every 5 minutes for up to 3 doses.  #25 x 6   Entered by:   Meredith Staggers, RN   Authorized by:   Dolores Patty, MD, [facility]   Signed by:   Meredith Staggers, RN on 07/20/2010   Method used:   Electronically to        [facility]* (retail)       [address]       [place], [place]  [id number]       Ph: [phone]       Fax: [phone]   RxID:   [id number]

## 2010-10-13 NOTE — Assessment & Plan Note (Signed)
Summary: ROV.MP   Primary Provider:  Ernesta Amble  CC:  dyslipidemia follow-up.  History of Present Illness:  Lipid Clinic Visit      The patient comes in today for dyslipidemia follow-up.  The patient has no history of medication problems, nausea, vomiting, diarrhea, muscle aches, and muscle cramps.  Dietary compliance review reveals an overall grade of A, eating 5 or more fruits and vegetables, and limiting fats and TFA's.  Review of exercise habits reveals that the patient is biking, twice a week, and for <20 minutes.    [name] returns to lipid clinic.    Current medication regimen includes Lipitor 40 mg daily, Niacin 2000 mg nightly, and Fish Oil 1200 mg twice daily.  He also takes a CoQ10 supplement.  He denies any medication problems and is tolerating therapy.     Review of diet reveals that the patient is making healthy choices with average meals as follows.  Breakfast:  Oatmeal with raisins and a banana. Lunch:  Leftovers from dinner or soup Dinner:  Grilled chicken, steak, or fish, occasionally tuna.   Includes an assortment of vegetables such as green beans, salad, potatoes, pt reports he eats plenty of vegetables.   Snacks:  V8 juice, occassional cookies, yogurt Beverages:  Water, pepsi, tea  Patient does not smoke and drinks occassionally, a couple times per week.   Review of exercise reveals the pt is exercising only occasionally which includes about 10 mintues twice weekly on a stationary bike.  He also attempts to stay active on the weekends, doing yardwork, etc.      Lipid Management Provider  Eda Keys, PharmD  Allergies: 1)  ! Morphine 2)  ! * Apples   Vital Signs:  Patient profile:   70 year old male Weight:      192 pounds BP sitting:   142 / 82  (left arm)  Impression & Recommendations:  Problem # 1:  HYPERLIPIDEMIA (ICD-272.4) Lipid values are as follows:  TC 154, TG 96, HDL 61.7, LDL 73 (goal <70).  Patient is at goal for all values,  except LDL, for which is he very close to goal.  Patient's diet is under well control; however, there is much room for improvement in the way of exercise.  I have encouraged patient to increase the amount of exercise he is doing throughout the week, although he states there is little time for this.   Plan:  Continue current medication regimen.  Continue making good dietary changes.  Increase exercise to at least 3-5 times/week.  Follow-up in 6 months.    His updated medication list for this problem includes:    Lipitor 40 Mg Tabs (Atorvastatin calcium) .Marland Kitchen... Take one tablet by mouth daily.    Niaspan 1000 Mg Cr-tabs (Niacin (antihyperlipidemic)) .Marland Kitchen... 2 tablets at bedtime

## 2010-10-13 NOTE — Cardiovascular Report (Signed)
Summary: Pre Cath Orders   Pre Cath Orders   Imported By: Roderic Ovens 08/03/2010 16:37:58  _____________________________________________________________________  External Attachment:    Type:   Image     Comment:   External Document

## 2010-10-13 NOTE — Assessment & Plan Note (Signed)
Summary: rov/jss   Primary Provider:  Ernesta Amble  CC:  ROV; No Complaints.  History of Present Illness: [name] is a very pleasant 70 year old male previously followed by Dr. Graceann Congress.  He has a history of coronary artery disease.  He has multiple angioplasties dating back to 47.  He also has a history of hypertension and hyperlipidemia and is followed by the Lipid Clinic.  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 He returns today for routine f/u.  He is very active and working 30-40 hours a week and he walks 2-3 times a week for 30 minutes or more with no chest pain or shortness of breath.  Pain in his R buttocks is limiting factor. Denies calf claudication. 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  It is much better if he warms up properly.  He has been checking his blood pressure occasionally at work, systolics are usually around 140. Feels fatigued relagtes it to BP meds. No palpitations or focal neuro symptoms.  Lipids managed by lipid clinic  TC 154 TG 96 HDL 62 LDL 73   Current Medications (verified): 1)  Aspirin 81 Mg Tbec (Aspirin) .... Take 2 Tablets By Mouth Daily 2)  Lipitor 40 Mg Tabs (Atorvastatin Calcium) .... Take One Tablet By Mouth Daily. 3)  Amlodipine Besylate 10 Mg Tabs (Amlodipine Besylate) .... Take One Tablet By Mouth Daily 4)  Niaspan 1000 Mg Cr-Tabs (Niacin (Antihyperlipidemic)) .... 2 Tablets At Bedtime 5)  Flomax 0.4 Mg Xr24h-Cap (Tamsulosin Hcl) .... Alternate Days of 1 Tablet With 2 Tablets 6)  Glucosamine Hcl 1000 Mg Tabs (Glucosamine Hcl) .... Take One Tablet By Mouth Once Daily. 7)  Fish Oil Double Strength 1200 Mg Caps (Omega-3 Fatty Acids) .... 2 Caps Daily 8)  Co-Enzyme Q-10 10 Mg Caps (Coenzyme Q10) .... Daily 9)  Tylenol Extra Strength 500 Mg Tabs (Acetaminophen) .... 1000mg  At Bedtime As Needed 10)  Ramipril 5 Mg Caps (Ramipril) .... Take One Capsule By Mouth Daily  Allergies: 1)  !  Morphine 2)  ! * Apples  Past History:  Past Medical History: CAD s/ps multiple PCIs    -Myoview 62%. Normal. Apical thinning. 2007 HTN HL - followed in Lipid Clinic.     --LFTs went up on Lipitor 80 R carotid bruit    --u/s 40-59% B (6/10)   Vital Signs:  Patient profile:   70 year old male Height:      67 inches Weight:      194.75 pounds BMI:     30.61 Pulse rate:   72 / minute BP sitting:   166 / 78  (right arm) Cuff size:   regular  Vitals Entered By: Stanton Kidney, EMT-P (February 12, 2010 8:28 AM)  Physical Exam  General:  Gen: well appearing. no resp difficulty HEENT: normal Neck: supple. no JVD. Carotids 2+ bilat; +soft  R bruit. No lymphadenopathy or thryomegaly appreciated. Cor: PMI nondisplaced. Regular rate & rhythm. No rubs, murmur. +s4 Lungs: clear Abdomen: soft, nontender, nondistended. No hepatosplenomegaly. No bruits or masses. Good bowel sounds. Extremities: no cyanosis, clubbing, rash, edema. PTs 2+ bilaterally Neuro: alert & orientedx3, cranial nerves grossly intact. moves all 4 extremities w/o difficulty. affect pleasant    Impression & Recommendations:  Problem # 1:  CAD, NATIVE VESSEL (ICD-414.01) Stable. No evidence of ischemia. Continue current regimen. Recommended routine surveillance ETT but he would like to wait until next year as he is retiring soon.  Problem #  2:  CAROTID BRUIT, RIGHT (ICD-785.9) Asx. Due for f/u u/s next year.   Problem # 3:  HYPERTENSION, BENIGN (ICD-401.1) BP upper end of normal at work. Continue to follow with Dr. Rosezetta Schlatter.  Problem # 4:  HYPERLIPIDEMIA (ICD-272.4) Lipids look great.   Problem # 5:  Hip pain Given his vascualr disease I raised the possibility with him that this could be claudication but it is mild for him. We discussed exercise ABIs but once again he would like to defer unless it gets worse.   Other Orders: EKG w/ Interpretation (93000)  Patient Instructions: 1)  Your physician wants you to  follow-up in:  1 year. You will receive a reminder letter in the mail two months in advance. If you don't receive a letter, please call our office to schedule the follow-up appointment.   2)  Your physician has requested that you have a carotid duplex. This test is an ultrasound of the carotid arteries in your neck. It looks at blood flow through these arteries that supply the brain with blood. Allow one hour for this exam. There are no restrictions or special instructions.  Needs in 1 year 3)  Your physician has requested that you have an exercise tolerance test.  For further information please visit [email or web address]  Please also follow instruction sheet, as given.  Needs in 1 year

## 2010-10-19 ENCOUNTER — Encounter: Payer: Self-pay | Admitting: Internal Medicine

## 2010-10-19 ENCOUNTER — Ambulatory Visit (INDEPENDENT_AMBULATORY_CARE_PROVIDER_SITE_OTHER): Payer: BC Managed Care – PPO | Admitting: Internal Medicine

## 2010-10-19 DIAGNOSIS — I2510 Atherosclerotic heart disease of native coronary artery without angina pectoris: Secondary | ICD-10-CM

## 2010-10-19 DIAGNOSIS — I63239 Cerebral infarction due to unspecified occlusion or stenosis of unspecified carotid arteries: Secondary | ICD-10-CM

## 2010-10-19 DIAGNOSIS — I10 Essential (primary) hypertension: Secondary | ICD-10-CM

## 2010-10-26 ENCOUNTER — Encounter: Payer: Self-pay | Admitting: Internal Medicine

## 2010-10-26 ENCOUNTER — Other Ambulatory Visit: Payer: BC Managed Care – PPO

## 2010-10-26 ENCOUNTER — Other Ambulatory Visit: Payer: Self-pay | Admitting: Internal Medicine

## 2010-10-26 ENCOUNTER — Encounter (INDEPENDENT_AMBULATORY_CARE_PROVIDER_SITE_OTHER): Payer: Self-pay | Admitting: *Deleted

## 2010-10-26 ENCOUNTER — Encounter (INDEPENDENT_AMBULATORY_CARE_PROVIDER_SITE_OTHER): Payer: BC Managed Care – PPO

## 2010-10-26 DIAGNOSIS — I6529 Occlusion and stenosis of unspecified carotid artery: Secondary | ICD-10-CM

## 2010-10-26 DIAGNOSIS — E7800 Pure hypercholesterolemia, unspecified: Secondary | ICD-10-CM

## 2010-10-26 DIAGNOSIS — Z79899 Other long term (current) drug therapy: Secondary | ICD-10-CM

## 2010-10-26 LAB — HEPATIC FUNCTION PANEL
ALT: 66 U/L — ABNORMAL HIGH (ref 0–53)
AST: 58 U/L — ABNORMAL HIGH (ref 0–37)
Albumin: 3.3 g/dL — ABNORMAL LOW (ref 3.5–5.2)
Alkaline Phosphatase: 50 U/L (ref 39–117)
Bilirubin, Direct: 0.2 mg/dL (ref 0.0–0.3)
Total Bilirubin: 1.6 mg/dL — ABNORMAL HIGH (ref 0.3–1.2)
Total Protein: 5.6 g/dL — ABNORMAL LOW (ref 6.0–8.3)

## 2010-10-26 LAB — LIPID PANEL
Cholesterol: 111 mg/dL (ref 0–200)
HDL: 38.1 mg/dL — ABNORMAL LOW (ref >39.00)
LDL Cholesterol: 66 mg/dL (ref 0–99)
Total CHOL/HDL Ratio: 3
Triglycerides: 37 mg/dL (ref 0.0–149.0)
VLDL: 7.4 mg/dL (ref 0.0–40.0)

## 2010-10-29 NOTE — Assessment & Plan Note (Signed)
Summary: 3 month rov.sl, rs from [facility]      Allergies Added:   Primary Provider:  Ernesta Amble  CC:  no complaints.  History of Present Illness: [name] is a very pleasant 70 year old male previously followed by Dr. Graceann Congress.  He has a history of coronary artery disease.  He has multiple angioplasties to the RCA dating back to 24.  He also has a history of hypertension and hyperlipidemia and is followed by the Lipid Clinic.  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   He was hospitalized iun 11/11 because of recurrent chest pain. He underwent drug-eluting stent to the proximal RCA July 24, 2010. Ejection fraction was 65%. He also had 99% stenosis in the distal PDA with left to right collaterals and nonobstructive disease in the left coronary tree.  He returns for f/u. The patient denies any further chest pain since his stent was placed.  He walks several miles twice a day several days a week. He follows a strict diet and does not smoke. Recently got over a GI bug.   ECG SR 79 No ST-T wave abnormalities.   Current Medications (verified): 1)  Aspirin Ec 325 Mg Tbec (Aspirin) .... Take One Tablet By Mouth Daily 2)  Lipitor 40 Mg Tabs (Atorvastatin Calcium) .... Take One Tablet By Mouth Daily. 3)  Amlodipine Besylate 10 Mg Tabs (Amlodipine Besylate) .... Take One Tablet By Mouth Daily 4)  Niaspan 1000 Mg Cr-Tabs (Niacin (Antihyperlipidemic)) .... 2 Tablets At Bedtime 5)  Flomax 0.4 Mg Xr24h-Cap (Tamsulosin Hcl) .... Alternate Days of 1 Tablet With 2 Tablets 6)  Glucosamine Hcl 1000 Mg Tabs (Glucosamine Hcl) .... Take One Tablet By Mouth Once Daily. 7)  Fish Oil Double Strength 1200 Mg Caps (Omega-3 Fatty Acids) .... 2 Caps Daily 8)  Tylenol Extra Strength 500 Mg Tabs (Acetaminophen) .... 1000mg  At Bedtime As Needed 9)  Ramipril 5 Mg Caps (Ramipril) .... Take One Capsule By Mouth Daily 10)  Nitrostat 0.4 Mg Subl  (Nitroglycerin) .Marland Kitchen.. 1 Tablet Under Tongue At Onset of Chest Pain; You May Repeat Every 5 Minutes For Up To 3 Doses. 11)  Vitamin B Complex-C   Caps (B Complex-C) .... Once Daily 12)  Plavix 75 Mg Tabs (Clopidogrel Bisulfate) .... Take One Tablet By Mouth Daily  Allergies (verified): 1)  ! Morphine 2)  ! * Apples  Past History:  Past Medical History: CAD s/ps multiple PCIs to RCA    -Myoview 62%. Normal. Apical thinning. 2007   --Anomalous LCX coming off RCA ostium. HTN HL - followed in Lipid Clinic.     --LFTs went up on Lipitor 80 R carotid bruit    --u/s 40-59% B (6/10)   Vital Signs:  Patient profile:   70 year old male Height:      67 inches Weight:      200 pounds Pulse rate:   92 / minute Pulse rhythm:   regular BP sitting:   128 / 58  (left arm)  Vitals Entered By: Jacquelin Hawking, CMA (October 19, 2010 10:04 AM)  Physical Exam  General:   Well-nournished, in no acute distress. Neck: No JVD, HJR, or thyroid enlargement. Soft R bruit Lungs: No tachypnea, clear without wheezing, rales, or rhonchi Cardiovascular: RRR, PMI not displaced, heart sounds normal, no murmurs,bruit, thrill, or heave. +s4 Abdomen: BS normal. Soft without organomegaly, masses, lesions or tenderness. Extremities:right groin without hematoma or hemorrhage, lower extremities without cyanosis, clubbing or  edema. Good distal pulses bilateral SKin: Warm, no lesions or rashes  Musculoskeletal: No deformities Neuro: no focal signs    Impression & Recommendations:  Problem # 1:  CAD, NATIVE VESSEL (ICD-414.01) Stable. No evidence of ischemia. Continue current regimen.  Problem # 2:  CAROTID BRUIT, RIGHT (ICD-785.9)  Due for repeat u/s. Will schedule/  Orders: Carotid Duplex (Carotid Duplex)  Problem # 3:  HYPERTENSION, BENIGN (ICD-401.1) Blood pressure well controlled. Continue current regimen.  Problem # 4:  HYPERLIPIDEMIA (ICD-272.4) Followed by Lipid clinic.   Other Orders: EKG w/  Interpretation (93000)  Patient Instructions: 1)  Your physician has requested that you have a carotid duplex. This test is an ultrasound of the carotid arteries in your neck. It looks at blood flow through these arteries that supply the brain with blood. Allow one hour for this exam. There are no restrictions or special instructions. 2)  Your physician wants you to follow-up in:  6 months.  You will receive a reminder letter in the mail two months in advance. If you don't receive a letter, please call our office to schedule the follow-up appointment.

## 2010-11-02 ENCOUNTER — Encounter: Payer: Self-pay | Admitting: Cardiovascular Disease

## 2010-11-02 ENCOUNTER — Ambulatory Visit (INDEPENDENT_AMBULATORY_CARE_PROVIDER_SITE_OTHER): Payer: BC Managed Care – PPO

## 2010-11-02 DIAGNOSIS — Z79899 Other long term (current) drug therapy: Secondary | ICD-10-CM

## 2010-11-02 DIAGNOSIS — I059 Rheumatic mitral valve disease, unspecified: Secondary | ICD-10-CM

## 2010-11-04 NOTE — Letter (Signed)
Summary: [facility] - Lipid  [facility], Main Office  [address]   [place], [place] [address]   Phone: [phone]  Fax: [phone]         October 26, 2010 MRN: [id number]     [name] [address] [place], [place]  [address]     Dear Mr. Miramontes,  We have reviewed your cholesterol results.  They are as follows:     Total Cholesterol:    111 (Desirable: less than 200)       HDL  Cholesterol:     38.10  (Desirable: greater than 40 for men and 50 for women)       LDL Cholesterol:       66  (Desirable: less than 100 for low risk and less than 70 for moderate to high risk)       Triglycerides:       37.0  (Desirable: less than 150)  Our recommendations include:  Please repeat your fasting lab work (lipid and liver profile)in 3 months.   Call our office at the number listed above if you have any questions.  Lowering your LDL cholesterol is important, but it is only one of a large number of "risk factors" that may indicate that you are at risk for heart disease, stroke or other complications of hardening of the arteries.  Other risk factors include:   A.  Cigarette Smoking* B.  High Blood Pressure* C.  Obesity* D.   Low HDL Cholesterol (see yours above)* E.   Diabetes Mellitus (higher risk if your is uncontrolled) F.  Family history of premature heart disease G.  Previous history of stroke or cardiovascular disease        *These are risk factors YOU HAVE CONTROL OVER.  For more information, visit .  There is now evidence that lowering the TOTAL CHOLESTEROL AND LDL CHOLESTEROL can reduce the risk of heart disease.  The American Heart Association recommends the following guidelines for the treatment of elevated cholesterol:  1.  If there is now current heart disease and less than two risk factors, TOTAL CHOLESTEROL should be less than 200 and LDL CHOLESTEROL should be less than 100. 2.  If there is current heart disease or two or more risk  factors, TOTAL CHOLESTEROL should be less than 200 and LDL CHOLESTEROL should be less than 70.  A diet low in cholesterol, saturated fat, and calories is the cornerstone of treatment for elevated cholesterol.  Cessation of smoking and exercise are also important in the management of elevated cholesterol and preventing vascular disease.  Studies have shown that 30 to 60 minutes of physical activity most days can help lower blood pressure, lower cholesterol, and keep your weight at a healthy level.  Drug therapy is used when cholesterol levels do not respond to therapeutic lifestyle changes (smoking cessation, diet, and exercise) and remains unacceptably high.  If medication is started, it is important to have you levels checked periodically to evaluate the need for further treatment options.    Thank you,     Avie Arenas, RN for [facility] Team

## 2010-11-09 ENCOUNTER — Other Ambulatory Visit: Payer: Self-pay | Admitting: Internal Medicine

## 2010-11-09 ENCOUNTER — Other Ambulatory Visit: Payer: BC Managed Care – PPO

## 2010-11-09 ENCOUNTER — Encounter (INDEPENDENT_AMBULATORY_CARE_PROVIDER_SITE_OTHER): Payer: Self-pay | Admitting: *Deleted

## 2010-11-09 DIAGNOSIS — Z79899 Other long term (current) drug therapy: Secondary | ICD-10-CM

## 2010-11-09 DIAGNOSIS — E785 Hyperlipidemia, unspecified: Secondary | ICD-10-CM

## 2010-11-09 LAB — HEPATIC FUNCTION PANEL
ALT: 34 U/L (ref 0–53)
AST: 35 U/L (ref 0–37)
Albumin: 3.7 g/dL (ref 3.5–5.2)
Alkaline Phosphatase: 46 U/L (ref 39–117)
Bilirubin, Direct: 0.2 mg/dL (ref 0.0–0.3)
Total Bilirubin: 1.5 mg/dL — ABNORMAL HIGH (ref 0.3–1.2)
Total Protein: 6 g/dL (ref 6.0–8.3)

## 2010-11-10 NOTE — Assessment & Plan Note (Signed)
Summary: LIPID F/U/SL  Medications Added NIASPAN 1000 MG CR-TABS (NIACIN (ANTIHYPERLIPIDEMIC)) 1 tablet at bedtime        Visit Type:  Follow-up Primary Provider:  Ernesta Amble   History of Present Illness: Patient reports to lipid clinic today for follow-up.  He reports being more tired than usual but does not know if he can attribute it to medication and does not report any other side effects due to the medications that he is currently taking.  Patient is complaint with cholesterol medications, which include Lipitor 40mg  at bedtime, Niaspan 2000mg  CR at bedtime, and fish oil 2400mg  daily.  He does admit that since he has had several procedures in the last few months and is not starting back at work that he has been exercising less than normal. Patient reports drinking 1-2 beers daily.  His diet is unchanged and he is still making healthy choices and avoiding fried foods.       Preventive Screening-Counseling & Management  Alcohol-Tobacco     Alcohol drinks/day: 1     Alcohol type: beer  Current Medications (verified): 1)  Aspirin Ec 325 Mg Tbec (Aspirin) .... Take One Tablet By Mouth Daily 2)  Lipitor 40 Mg Tabs (Atorvastatin Calcium) .... Take One Tablet By Mouth Daily. 3)  Amlodipine Besylate 10 Mg Tabs (Amlodipine Besylate) .... Take One Tablet By Mouth Daily 4)  Niaspan 1000 Mg Cr-Tabs (Niacin (Antihyperlipidemic)) .Marland Kitchen.. 1 Tablet At Bedtime 5)  Flomax 0.4 Mg Xr24h-Cap (Tamsulosin Hcl) .... Alternate Days of 1 Tablet With 2 Tablets 6)  Glucosamine Hcl 1000 Mg Tabs (Glucosamine Hcl) .... Take One Tablet By Mouth Once Daily. 7)  Fish Oil Double Strength 1200 Mg Caps (Omega-3 Fatty Acids) .... 2 Caps Daily 8)  Tylenol Extra Strength 500 Mg Tabs (Acetaminophen) .... 1000mg  At Bedtime As Needed 9)  Ramipril 5 Mg Caps (Ramipril) .... Take One Capsule By Mouth Daily 10)  Nitrostat 0.4 Mg Subl (Nitroglycerin) .Marland Kitchen.. 1 Tablet Under Tongue At Onset of Chest Pain; You May Repeat Every 5  Minutes For Up To 3 Doses. 11)  Vitamin B Complex-C   Caps (B Complex-C) .... Once Daily 12)  Plavix 75 Mg Tabs (Clopidogrel Bisulfate) .... Take One Tablet By Mouth Daily  Allergies: 1)  ! Morphine 2)  ! * Apples  Past History:  Past Medical History: Last updated: 10/19/2010 CAD s/ps multiple PCIs to RCA    -Myoview 62%. Normal. Apical thinning. 2007   --Anomalous LCX coming off RCA ostium. HTN HL - followed in Lipid Clinic.     --LFTs went up on Lipitor 80 R carotid bruit    --u/s 40-59% B (6/10)   Risk Factors: Alcohol Use: 1 (11/02/2010)  Social History: Alcohol drinks/day:  1  Vital Signs:  Patient profile:   70 year old male Weight:      199.25 pounds   Impression & Recommendations:  Problem # 1:  HYPERLIPIDEMIA (ICD-272.4) Assessment Improved Lipid values are as follows:  TC 111, TG 37, HDL 38.1, LDL 66 (goal <70).  Patient is at goal for all values, except HDL. AST and ALT are increased (58 and 66 respectively) from last test (8/11).  Patient's diet is under good control and he is making healthy choices.  I have encouraged patient to increase the amount of exercise he is doing throughout the week as tolerated. Today in clinic we reduced his Niaspan to 1000mg  at bedtime, continued his Lipitor 40mg  at bedtime, and continued his fish oil supplement 2400mg  daily.  The combination of the Niaspan and Lipitor may be contributing to liver enzyme increase at this time.  Patient has agreed to increase his exercise and to folllow-up in 4 weeks to reassess his LFTs.  He has an appointment with [facility] on 3/19 and will return to lipid clinic on 3/26 to review results and make adjustments to medications if necessary.      His updated medication list for this problem includes:    Lipitor 40 Mg Tabs (Atorvastatin calcium) .Marland Kitchen... Take one tablet by mouth daily.    Niaspan 1000 Mg Cr-tabs (Niacin (antihyperlipidemic)) .Marland Kitchen... 1 tablet at bedtime  Complete Medication List: 1)  Aspirin  Ec 325 Mg Tbec (Aspirin) .... Take one tablet by mouth daily 2)  Lipitor 40 Mg Tabs (Atorvastatin calcium) .... Take one tablet by mouth daily. 3)  Amlodipine Besylate 10 Mg Tabs (Amlodipine besylate) .... Take one tablet by mouth daily 4)  Niaspan 1000 Mg Cr-tabs (Niacin (antihyperlipidemic)) .Marland Kitchen.. 1 tablet at bedtime 5)  Flomax 0.4 Mg Xr24h-cap (Tamsulosin hcl) .... Alternate days of 1 tablet with 2 tablets 6)  Glucosamine Hcl 1000 Mg Tabs (Glucosamine hcl) .... Take one tablet by mouth once daily. 7)  Fish Oil Double Strength 1200 Mg Caps (Omega-3 fatty acids) .... 2 caps daily 8)  Tylenol Extra Strength 500 Mg Tabs (Acetaminophen) .... 1000mg  at bedtime as needed 9)  Ramipril 5 Mg Caps (Ramipril) .... Take one capsule by mouth daily 10)  Nitrostat 0.4 Mg Subl (Nitroglycerin) .Marland Kitchen.. 1 tablet under tongue at onset of chest pain; you may repeat every 5 minutes for up to 3 doses. 11)  Vitamin B Complex-c Caps (B complex-c) .... Once daily 12)  Plavix 75 Mg Tabs (Clopidogrel bisulfate) .... Take one tablet by mouth daily  Patient Instructions: 1)  Decrease your Niaspan to 1000mg  CR at bedtime.  2)  Continue to take Lipitor 40mg  at bedtime. 3)  Continue to take your Fish Oil 2400mg  daily  4)  Try to increase your exercise level to improve your HDL. 5)  Recheck liver enzymes on 11/30/2010 at 8:30 am weeks at [facility]  6)  Return to lipid clinic on 12/07/2010 at 4:15 pm

## 2010-11-24 LAB — BASIC METABOLIC PANEL
BUN: 6 mg/dL (ref 6–23)
CO2: 30 mEq/L (ref 19–32)
Calcium: 9.2 mg/dL (ref 8.4–10.5)
Chloride: 103 mEq/L (ref 96–112)
Creatinine, Ser: 0.74 mg/dL (ref 0.4–1.5)
GFR calc Af Amer: >60 mL/min (ref >60)
GFR calc non Af Amer: >60 mL/min (ref >60)
Glucose, Bld: 99 mg/dL (ref 70–99)
Potassium: 4 mEq/L (ref 3.5–5.1)
Sodium: 141 mEq/L (ref 135–145)

## 2010-11-24 LAB — CBC
HCT: 41.2 % (ref 39.0–52.0)
Hemoglobin: 14.1 g/dL (ref 13.0–17.0)
MCH: 33.3 pg (ref 26.0–34.0)
MCHC: 34.2 g/dL (ref 30.0–36.0)
MCV: 97.2 fL (ref 78.0–100.0)
Platelets: 170 10*3/uL (ref 150–400)
RBC: 4.24 MIL/uL (ref 4.22–5.81)
RDW: 12.8 % (ref 11.5–15.5)
WBC: 6.5 10*3/uL (ref 4.0–10.5)

## 2010-12-07 ENCOUNTER — Ambulatory Visit: Payer: BC Managed Care – PPO

## 2010-12-10 ENCOUNTER — Ambulatory Visit: Payer: BC Managed Care – PPO

## 2011-01-26 NOTE — Assessment & Plan Note (Signed)
[facility]                               LIPID CLINIC NOTE   TAMARIUS, ROSENFIELD                        MRN:          [id number]  DATE:07/31/2007                            DOB:          February 18, 1941    Mr. Awan is seen back in the lipid clinic for further evaluation and  medication titration associated with his hyperlipidemia in the setting  of documented coronary disease. He has been doing very well. He does eat  multiple junk food periodically but continues to work on portion  control. He avoids tobacco and alcohol. He does continue to have back  pain and usually performs stretching activities 3-4 times each times  each week. He has been walking or riding his stationary bike 1-2 times a  week this time of year. He has been compliant with his Niaspan 2 grams  and his Zetia 40 mg daily, his Mavik 4 mg daily, his aspirin 81 mg daily  and his glucosamine 1500 mg daily. Since the media reports, he has  discontinued his Zetia.   PAST MEDICAL HISTORY:  Pertinent for hyperlipidemia, documented coronary  disease.   CURRENT MEDICATIONS:  As noted above.   PHYSICAL EXAMINATION:  Weight today is 194 pounds, blood pressure is  150/72, heart rate is 78.   LABORATORY DATA:  On July 21, 2007 total cholesterol 117,  triglycerides 59, HDL 51, LDL 54, LFTs within normal limits.   ASSESSMENT:  The patient is not really willing to take his Zetia this  time due to concerns about media reports.   PLAN:  We have continued the patient on other therapies for now. We have  discussed portion control and awareness. We will continue to increase  his activity. Will followup his lipid panel and make sure it is okay  without Zetia and does not require an increase in Lipitor in 3 months  but I anticipate this will be fine. The patient appears motivated and  willing to work. We discussed the  Zetia concerns as well as the recent JUPITER trial and the patient's  questions  have been answered.     Shelby Dubin, PharmD, BCPS, CPP  Electronically Signed      Rollene Rotunda, MD, [facility]  Electronically Signed   MP/MedQ  DD: 08/03/2007  DT: 08/04/2007  Job #: [id number]   cc:   Veverly Fells. Excell Seltzer, MD

## 2011-01-26 NOTE — Assessment & Plan Note (Signed)
[facility]                               LIPID CLINIC NOTE   [name], [name]                        MRN:          [id number]  DATE:10/21/2008                            DOB:          06/26/1941    The patient seen in Lipid Clinic for evaluation and medication titration  associated with his hyperlipidemia.  He has been feeling and doing okay.  He has had multiple medication intolerances.   He is currently maintained on,  1. Lipitor 40 mg daily.  2. Zetia 10 mg daily.  3. Niaspan 2 g daily at bedtime.  4. Fish oil 3 g daily.   ALLERGIES/INTOLERANCE:  MORPHINE, which makes him loopy and has nausea.  He is intolerant to CRESTOR, LIPITOR 80, LOVASTATIN, and ZOCOR.   He is a nonsmoker and occasionally has a glass of wine with dinner.  He  selects low fat, low cholesterol meat, frequent on vegetables and  fruits.  He uses yogurts and pretzels primarily as a snack.  He has  trouble finding time to exercise.  He tries to obtain at least 15 to 20  minutes on the treadmill, 2 to 3 times a week.  He sometimes bikes, but  has not done that in this winter.   PHYSICAL EXAMINATION:  Today, 194 pounds, blood pressure 140/62, heart  rate is 100.   LABORATORY DATA:  October 16, 2008, total cholesterol 117, triglycerides  59, HDL 51, LDL 54, LFTs are within normal limits except his T-bili  elevated at 1.9 which is normal for the patient.   ASSESSMENT:  The patient is doing well on his current therapies.  He  will continue his current medications.  He will continue his diet and  try to begin using unsweetened oatmeal meal and add fruit as his  breakfast or snack option.  He will continue to work towards the goal of  2 to 3 times a week, 45 minutes.  Physical activity, he will continue to  work to his heart rate on regular basis.  He will follow up in 6 months.  The patient was seen with Mina Marble, PharmD.Shelby Dubin, PharmD, BCPS, CPP  Electronically Signed      [name] Rotunda, MD, [facility]  Electronically Signed   MP/MedQ  DD: 11/08/2008  DT: 11/08/2008  Job #: [id number]

## 2011-01-26 NOTE — Assessment & Plan Note (Signed)
[facility]                            CARDIOLOGY OFFICE NOTE   MARGUIS, MATHIESON                        MRN:          [id number]  DATE:08/18/2007                            DOB:          [birth year]    INTERVAL HISTORY:  Mr. 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 He has  had multiple angioplasties dating back to 74. He also has a history of  hypertension and hyperlipidemia and is followed by the lipid clinic. He  was previously followed by Dr. Glennon Hamilton and presents today to  establish long-term followup.   From a cardiac point of view, he is doing quite well. He denies any  chest pain or shortness of breath. He has been under a lot of stress due  to his wife recently loosing her job and also the financial situation.  His main complaint is back pain. He has been followed by a chiropractor  and a physical therapist.   CURRENT MEDICATIONS:  1. Niaspan 2000 a day.  2. Aspirin 81 a day.  3. Mavik 4 mg a day.  4. Fish oil.  5. Lipitor 40.  6. Flomax.   PHYSICAL EXAMINATION:  He is well appearing, no acute distress. He  ambulates around the clinic without any respiratory difficulty. Blood  pressure initially was 184/86; by manual recheck 192/80. Heart rate 77,  weight 193.  HEENT:  Is normal.  NECK:  Is supple. There is no JVD. Carotids are 2+ bilaterally with  bilateral bruits. No lymphadenopathy or thyromegaly.  CARDIAC:  Has a regular rate and rhythm with a S4. No murmur.  LUNGS:  Are clear.  ABDOMEN:  Soft, nontender, nondistended. There is hepatosplenomegaly. No  bruits. No masses. Good bowel sounds.  EXTREMITIES:  Are warm with no clubbing, cyanosis, or edema.  NEUROLOGICAL:  Alert and oriented x3. Cranial nerves II-XII are intact.  Moves all four extremities without difficulty. Affect is  pleasant.   EKG shows normal sinus rhythm with a rate of 77. No ST-T abnormalities.  There is left atrial enlargement. Carotid ultrasound shows 40-59%  stenosis bilaterally which has been stable.   ASSESSMENT AND PLAN:  1. Coronary artery disease. Stable without any evidence of ongoing      ischemia. Continue current therapy.  2. Hypertension. Blood pressure is markedly elevated. I have asked him      to keep a blood pressure log and will also go ahead and start him      on Norvasc at 5 mg a day in the form of Caduet 40/5. He is very      concerned about treating his blood pressure too aggressively as he      is worried about fatigue during his hour-long commute every      morning.  3. Hyperlipidemia. Lipids are at goal. He is followed by the lipid      clinic.  4. Carotid artery stenosis. This is stable. Will continue routine  surveillance every 2 to 3 years.     Bevelyn Buckles. Bensimhon, MD  Electronically Signed    DRB/MedQ  DD: 08/18/2007  DT: 08/19/2007  Job #: [id number]

## 2011-01-26 NOTE — Assessment & Plan Note (Signed)
[facility]                            CARDIOLOGY OFFICE NOTE   ERLAND, VIVAS                        MRN:          [id number]  DATE:03/04/2008                            DOB:          1941-01-27    INTERVAL HISTORY:  [name] is a very pleasant 70 year old man with a  history of coronary artery disease with a normal left circumflex arising  from the right coronary ostium.  He has had multiple angioplasties  dating back to 73.  He also has a history of hypertension and  hyperlipidemia  and was followed by the Lipid Clinic.   He returns today for a routine followup.  At his last visit, his blood  pressure was markedly elevated, we added Norvasc.  He feels that this is  making him very fatigue and also causing him to swell.  He denies any  chest pain or shortness of breath.  He continues to exercise with  walking on a stationary bike without any problems.  He says though  during the day he is very fatigued and often falls asleep at his desk.  His wife does tell him he snores, but there has been no witnessed apnea  apparently.  He does get up numerous times due to nocturia and also has  trouble sleeping because his wife snores as well.   MEDICATIONS:  1. Niaspan 2000 mg a day.  2. Aspirin 162 a day.  3. Mavik 4 mg a day.  4. Fish oil.  5. Flomax.  6. Caduet 40/5.  7. Glucosamine.   PHYSICAL EXAMINATION:  GENERAL:  He is no acute distress, ambulates  around the clinic without any respiratory difficulty.  VITAL SIGNS:  Blood pressure is 122/70, heart rate is 88, and weight is  193.  HEENT:  Normal.  NECK:  Supple.  There is no JVD.  Carotids are 2+ bilaterally without  bruits.  There is no lymphadenopathy or thyromegaly.  CARDIAC:  PMI is nondisplaced.  Regular rate and rhythm.  No murmurs,  rubs, or gallops.  LUNGS:  Clear.  ABDOMEN:  Mildly obese.  Nontender and  nondistended.  No hepatomegaly.  No bruits.  No masses.  Good bowel  sounds.  EXTREMITIES:  Warm.  No cyanosis, clubbing, or edema.  No rash.  NEURO:  Alert and oriented x3.  Cranial nerves II through XII are  intact.  Moves all 4 extremities without difficulty.  Affect is  pleasant.   EKG shows sinus rhythm with incomplete right bundle-branch block, rate  of 88.  QRS duration is 96 milliseconds.   ASSESSMENT AND PLAN:  1. Coronary artery disease is stable, no evidence of ischemia.  2. Hypertension is well controlled with addition of Norvasc.  3. Hyperlipidemia.  LDL is almost at goal of 77.  He is currently on      Lipitor 40 as part of his Caduet.  He previously was unable to      tolerate 80, we will keep him where he is at.  4. Fatigue.  I suspect this is more due to possible sleep apnea than  his blood pressure medication.  We will refer him for a sleep      study.   DISPOSITION:  I will see him back in several months for a routine  followup.     Bevelyn Buckles. Bensimhon, MD  Electronically Signed    DRB/MedQ  DD: 03/04/2008  DT: 03/05/2008  Job #: [id number]   cc:   Coralyn Helling, MD  Marjory Lies, M.D.

## 2011-01-26 NOTE — Assessment & Plan Note (Signed)
[facility]                            CARDIOLOGY OFFICE NOTE   [name], [name]                        MRN:          [id number]  DATE:09/16/2008                            DOB:          1941-08-04    PRIMARY CARE PHYSICIAN:  Delorse Lek, MD   INTERVAL HISTORY:  [name] is a very pleasant 70 year old male previously  followed by Dr. Graceann Congress.  He has a history of coronary artery  disease.  He has multiple angioplasties dating back to 64.  He also  has a history of hypertension and hyperlipidemia and is followed by the  Lipid Clinic.  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   At his last visit in June, we suggested a possible sleep study due to  his fatigue, but he says he did not go for this test as he is well aware  of why he is tired.  He does not sleep well at night due to his back  pain, his wife's snoring, and his nocturia from his prostate.  He is  very active and working 40-50 hours a week and he walks 3 times a week  with no chest pain or shortness of breath.  He has been checking his  blood pressure occasionally, systolics are usually in the 140 range.   Review of systems is notable for back pain and nocturia, otherwise all  systems negative.   CURRENT MEDICATIONS:  1. Aspirin 162 a day.  2. Fish oil.  3. Glucosamine.  4. Flomax 0.4 alternating with 0.8 every other day.  5. Zetia 10.  6. Niaspan 2 g a day.  7. Bextra.  8. Coenzyme Q10.  9. Caduet 40/5.  10.Glucosamine.  11.He was previously on Mavik 4 mg a day, but says this was stopped by      his primary care physician.   PHYSICAL EXAMINATION:  GENERAL:  He is in no acute distress.  Ambulates  around the clinic without any respiratory difficulty.  VITAL SIGNS:  Blood pressure initially was 122/66 and on followup  154/72, heart rate 77, weight is 188.  HEENT:  Normal.  NECK:  Supple.  No JVD.  Carotids are 2+  bilaterally without any bruits.  There is no lymphadenopathy or thyromegaly.  CARDIAC:  PMI is nondisplaced.  Regular rate and rhythm with an S4.  No  murmurs.  LUNGS:  Clear.  ABDOMEN:  Soft, nontender, nondistended.  No hepatosplenomegaly.  No  bruits.  No masses.  Good bowel sounds.  EXTREMITIES:  Warm with no cyanosis, clubbing, or edema.  No rash.  NEUROLOGIC:  Alert and oriented x3.  Cranial nerves II through XII are  intact.  Moves all 4 extremities without difficulty.  Affect is  pleasant.   EKG shows normal sinus rhythm at a rate of 77.  There is a left atrial  enlargement and incomplete right bundle-branch block and no significant  ST-T wave abnormalities.   ASSESSMENT AND PLAN:  1. Coronary artery disease.  This is stable.  Continue current  therapy.  2. Hypertension.  Blood pressure is elevated today.  We will increase      Norvasc to 10, but I suspect he may need further antihypertensive      therapy.  We may consider adding back an ACE inhibitor or starting      spironolactone.  I have asked him to keep a blood pressure log.  3. Hyperlipidemia.  He will follow up with the Lipid Clinic.  Goal LDL      is less than 70.  He was previously unable to tolerate Lipitor at      80.   DISPOSITION:  Return to clinic in 3-4 months.     Bevelyn Buckles. Bensimhon, MD  Electronically Signed    DRB/MedQ  DD: 09/16/2008  DT: 09/17/2008  Job #: [id number]

## 2011-01-26 NOTE — Assessment & Plan Note (Signed)
[facility]                               LIPID CLINIC NOTE   ESCHOL, AUXIER                        MRN:          [id number]  DATE:01/30/2007                            DOB:          [birth year]    The patient seen back in Lipid Clinic for further evaluation and  medication titration associated with hyperlipidemia.  He has a  documented history of coronary disease.  He has been feeling and doing  well overall.  His back does continue to bother him, though his hip is  much better.   PAST MEDICAL HISTORY:  1. Hypertension.  2. Coronary artery disease. Status post PCI in 1982 and repeat PCI in      1999.   CURRENT MEDICATIONS:  1. Niaspan 1 gram tablets two at bedtime.  2. Glucosamine 1500 mg daily.  3. Lipitor 40 mg daily.  4. Zetia 10 mg daily.  5. Aspirin 81 mg tablets 2 daily.  6. Mavik 4 mg daily.  7. Fish oil daily.  8. Flomax 0.4 mg daily alternating with 0.8 mg on an every other day      basis.  Know that 1500 mg daily has acetaminophen      Shelby Dubin, PharmD, BCPS, CPP  Electronically Signed      Rollene Rotunda, MD, [facility]  Electronically Signed   MP/MedQ  DD: 02/03/2007  DT: 02/03/2007  Job #: [id number]   cc:   Cecil Cranker, MD, [facility]

## 2011-01-29 NOTE — Op Note (Signed)
NAME:  [name], [name] NO.:  [id number]   MEDICAL RECORD NO.:  [id number]          PATIENT TYPE:  INP   LOCATION:  5025                         FACILITY:  [facility]   PHYSICIAN:  Elana Alm. Thurston Hole, M.D. DATE OF BIRTH:  1941-04-17   DATE OF PROCEDURE:  01/05/2006  DATE OF DISCHARGE:                                 OPERATIVE REPORT   PREOPERATIVE DIAGNOSIS:  Left hip degenerative joint disease.   POSTOPERATIVE DIAGNOSIS:  Left hip degenerative joint disease.   PROCEDURE:  Left total hip replacement using S-ROM Press-Fit metal-on-metal  total hip system with acetabulum component 56 mm Press-Fit Sector acetabular  shell with one 20 mm locking screw and 56 mm metal insert.  Femoral  component 16 x 11 Press-Fit femoral stem with 36+6 femoral neck with 16D  large sleeve and 36+0 femoral head.   SURGEON:  Elana Alm. Thurston Hole, M.D.   ASSISTANT:  Julien Girt, P.A.   ANESTHESIA:  General.   OPERATIVE TIME:  1 hour and 30 minutes.   ESTIMATED BLOOD LOSS:  300 mL.   COMPLICATIONS:  None.   DESCRIPTION OF PROCEDURE:  [name] was brought to the operating room on  January 05, 2006, placed on the operative table in the supine position.  After  being placed under general anesthesia, he had a Foley catheter placed under  sterile conditions.  He received Ancef 2g IV preoperatively for prophylaxis.  His left hip was examined.  He had flexion to 90, extension to 0, internal  and external rotation of 20 degrees with minimal leg length discrepancy.  He  was then turned to the left lateral decubitus position and secured on the  bed with the [place] frame.  His left hip and leg was then prepped using  sterile DuraPrep and draped using sterile technique.  Originally, through a  15 cm posterolateral greater trochanter incision initial exposure was made.  The subcutaneous tissues were incised in line with the skin incision.  The  iliotibial band and gluteus maximus fascia was incised  longitudinally,  revealing the underlying sciatic nerve which was carefully protected.  The  short external rotators of the hip and hip capsule were carefully released  off the femoral neck insertions and tagged.  Heidi Dach, who is a  Curator and necessary for a second pair of skilled  hands, then helped posteriorly dislocate the hip and hold it in a very  perfect position for making the femoral neck cut 1.5 to 2 cm above the  lesser trochanter.  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  Ms. Shepperson then helped carefully place retractors,  again necessary with a second pair of skilled hands in order to expose the  acetabulum. The degenerative acetabular labrum was then removed.  The  acetabulum was found to have grade 3 and 4 chondromalacia as well.  Sequential acetabular reaming was then carried out up to a 55 mm size in the  appropriate amount of anteversion, abduction and inclination and then a 56  mm trial cup was hammered into position  with an excellent fit.  It was then  removed and the actual 56 mm Sector cup was hammered into position, in the  appropriate amount of anteversion, abduction and inclination, with an  excellent Press-Fit.  It was further secured in place with a 20 mm screw in  the 12 o'clock position.  The 56 mm metal insert was then placed in position  in the metal shell and hammered into position with an excellent fit.  At  this point, the proximal femur was exposed.  Again, Ms. Shepperson held the  femur in a skilled and perfect position as an Geophysicist/field seismologist.  Sequential axial  reamers were then used to ream up to 11.5 mm size and then using the conical  reamers a size D was used to make the conical reaming and then the calcar  reaming was carried out to a size large.  After this was done, then the size  D large trial sleeve was placed.  The trial femoral component 16 x 11, 36+6  femoral neck, was hammered into  position and then a 36 mm trial head, +0,  was placed, the hip reduced, taken through a full range of motion and found  to be stable and leg lengths equal.  At this point then the trial components  in the femur were removed.  The femoral canal was irrigated and then the 16  D large sleeve was hammered into position with an excellent fit and then the  16 x 11 femoral component with a 36+6 femoral neck was hammered into  position as well in a neutral position on the stem, but this actually gave  20 degrees of anteversion.  After this was placed in then the 36+0 femoral  head was hammered onto the femoral neck with an excellent Morse taper fit,  the hip was then reduced, taken through a full range of motion and found to  be stable and leg lengths equal.  At this point, it was felt that all of the  components were of excellent size, fit and stability.  The wound was  copiously irrigated again and then the short external rotators of the hip  and the hip capsule were reattached to their femoral neck insertion through  two drill holes in the greater trochanter.  The iliotibial band and gluteus  maximus fascia was closed with #1 Ethibond suture, subcutaneous tissues  closed with #0 and #2-0 Vicryl and the skin closed with skin staples.  Sterile dressings were applied, hip abduction pillow applied, patient turned  supine, checked for leg lengths that were equal, rotation was equal, pulses  2+ and symmetric.  He was then awakened, extubated and taken to recovery  room in stable condition.  Needle and sponge counts correct x2 at the end of  the case.      Robert A. Thurston Hole, M.D.  Electronically Signed     RAW/MEDQ  D:  01/05/2006  T:  01/06/2006  Job:  [id number]

## 2011-01-29 NOTE — Assessment & Plan Note (Signed)
[facility]                               LIPID CLINIC NOTE   [name], SCHAKE                        MRN:          [id number]  DATE:11/28/2006                            DOB:          [birth year]    Mr. Cardinal is seen in the Lipid Clinic for further evaluation and  medication titration associated with his hyperlipidemia in his past  history associated with coronary disease.  He has been feeling and doing  well overall.  He did undergo hip surgery approximately 1 year ago and  does continue to have some low back pain.   PAST MEDICAL HISTORY:  1. Coronary artery disease, status post PCI in 1982 and repeat in      1999.  2. Hypertension.   CURRENT MEDICATIONS:  1. Niaspan 2 grams daily.  2. Bedtime glucosamine 1500 mg daily.  3. Lipitor 20 mg daily at bedtime.  4. Zetia 10 mg daily.  5. Aspirin 162 mg daily.  6. Mavik 4 mg daily.  7. Flomax 0.4 mg daily.  8. Fish oil 2-3 grams daily for the past 6 months.   SOCIAL HISTORY:  The patient has no use of tobacco and only occasional  alcohol intake.  Diet review includes lean meats that are grilled or  baked, never fried.  Avoidance of salt and lots of fruits and  vegetables.  Exercise is limited for this patient due to his back pain.  He is undergoing physical therapy which begins this Thursday.  He does  work to walk or ride his bicycle for 30 minutes 3 times a week.  He does  require p.r.n. acetaminophen and Mobic 7.5 mg daily for this chronic low  back pain.   PHYSICAL EXAMINATION:  Weight today is 195 pounds, blood pressure is  164/70, heart rate is 64.   Labs on November 11, 2006 reveal total cholesterol 148, triglyceride 49,  HDL 53.5, LDL 85.  LFTs are within normal limits excepting a total  bilirubin of 1.4.   ASSESSMENT:  The patient has seen good improvement in his overall lipid  panel.  His LDL is greater than his goal of 70, but is doing well  overall.  Will increase the patient's  Lipitor to 40 mg watching his  liver function tests closely.  Will continue with outstanding diet and  exercise modification therapies.  Will check his LFTs and liver panel in  8 weeks, see him back at that time.   The patient was seen with Azucena Freed, PharmD.      Shelby Dubin, PharmD, BCPS, CPP  Electronically Signed      Rollene Rotunda, MD, [facility]  Electronically Signed   MP/MedQ  DD: 12/02/2006  DT: 12/02/2006  Job #: [id number]   cc:   Cecil Cranker, MD, [facility]

## 2011-01-29 NOTE — Discharge Summary (Signed)
NAME:  [name], [name] NO.:  [id number]   MEDICAL RECORD NO.:  [id number]          PATIENT TYPE:  INP   LOCATION:  5025                         FACILITY:  [facility]   PHYSICIAN:  Elana Alm. Thurston Hole, M.D. DATE OF BIRTH:  07/17/1941   DATE OF ADMISSION:  01/05/2006  DATE OF DISCHARGE:  01/08/2006                                 DISCHARGE SUMMARY   ADMITTING DIAGNOSES:  1.  End-stage degenerative joint disease, left hip.  2.  Coronary artery disease.   DISCHARGE DIAGNOSES:  1.  End-stage degenerative joint disease.  2.  Coronary artery disease.  3.  Hypertension.   HISTORY OF PRESENT ILLNESS:  The patient is a [age]-year-old white male with a  history of coronary artery disease.  He has end-stage DJD of his left hip.  He has failed conservative care including intra-articular cortisone  injections and anti-inflammatories.  He understands the risks, benefits and  possible complications of the left total hip replacement and is without  question.   PROCEDURES IN HOUSE:  On January 05, 2006,  the patient underwent a left total  hip replacement by Dr. Wyline Mood. He tolerated the procedure well.  Postop day  #1, he was doing well.  His biggest complaint was heel pain.  His hemoglobin  was 12.6.  He was metabolically stable.  Surgical wound was well  approximated.  He was neurovascularly intact. His PCA was discontinued.  His  IV was saline locked.  He was started on Percocet for pain, and he will  continue Robaxin for muscle spasm. His heels were floated on pillows. Postop  day #2, the patient was doing well, a little bit tired today. His is  hemoglobin was 10.6.  His sodium was 127.  He was given a bolus of normal  saline to improve his sodium.  His dressing was changed.  He is to continue  physical therapy.   Postop day #3, his vital signs were stable.  He was afebrile.  His sodium  was improved to 135.  His hemoglobin was 10.  Surgical wounds were well  approximated.  His  calves were soft.  He was neurovascularly intact.  He was  discharged to home in stable condition, touchdown weightbearing, on  Percocet, Robaxin, Lovenox.  Home health physical therapy, occupational  therapy, and total hip precautions.  He will follow up with Dr. Thurston Hole on  Jan 19, 2006.  Until that time, he will have home health therapy, physical  therapy, occupational therapy, and nursing.      Kirstin Shepperson, P.A.      Robert A. Thurston Hole, M.D.  Electronically Signed   KS/MEDQ  D:  02/08/2006  T:  02/08/2006  Job:  [id number]

## 2011-01-29 NOTE — Assessment & Plan Note (Signed)
[facility]                            CARDIOLOGY OFFICE NOTE   Lacharles, Altschuler GERASIMOS PLOTTS                        MRN:          [id number]  DATE:11/18/2006                            DOB:          24-Mar-1941    Mr. [name] is a very pleasant 70 year old white married male, followed  here since 1983 when he had his first cardiac intervention by Dr.  Margy Clarks.  At that time he had a high grade right coronary  stenosis.  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   Over the intervening years, he had several more percutaneous  interventions by Dr. Juanda Chance  most recently in 1999.  He had a negative  Myoview a year ago.  He was seen by Dr. Gala Romney for preop evaluation  in March 2007 prior to having hip replacement.  This was very  successful, however, over the past 3 weeks he has been bothered by low  back pain.   He is under the care of Dr. Wyline Mood.   His lipids have been fairly well controlled as is his blood pressure.  Should note his recent LDL, however, was  85, HDL 53.   He has no cardiac symptoms.   MEDICATIONS:  Include:  1. Niaspan 2 gm nightly.  2. Glucosamine 1500.  3. Lipitor 20.  4. Zetia 10.  5. Aspirin 162 mg.  6. Mavik 4 mg  7. Flomax 0.4.  8. Fish oil.   The patient has also been taking Advil 6 or 8 a day because of his back.   Blood pressure 142/78, pulse 71, normal saturation on room air.  JVP is not elevated.  Carotid pulse palpably equal without bruits.  LUNGS:  Are clear.  CARDIAC:  Normal.  ABDOMEN:  Exam normal.  EXTREMITIES:  Normal.  He has obviously discomfort with his back.   IMPRESSION:  1. Diagnosis as above.  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  Multiple percutaneous      interventions of the right coronary artery.  2. Significant low back pain.  3. Status post hip replacement.   I strongly recommended  the patient take proton pump inhibitor as long as  he is taking the Advil.   I should note that his recent renal profile was normal and an EKG  reveals right axis deviation, otherwise normal.   The patient continues same therapy except increase the Lipitor to 40.  I  will have him follow up in the Lipid Clinic, where he has not been seen  in a year.   I suggested he see Dr. Gala Romney who has seen him in the past and this  should be in 4 to 6 months or p.r.n.     E. Graceann Congress, MD, [facility]  Electronically Signed    EJL/MedQ  DD: 11/18/2006  DT: 11/18/2006  Job #: [id number]

## 2011-01-29 NOTE — Assessment & Plan Note (Signed)
[facility]                              CARDIOLOGY OFFICE NOTE   NAME:[name] [name] [name]                        MRN:          [id number]  DATE:05/23/2006                            DOB:          [birth year]    This is a very pleasant 70 year old white married male followed here since  1983 when he had his first coronary artery intervention by Dr. 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  He had several percutaneous interventions in the  intervening years, most recently 1999.  He has had no recurrent symptoms and  gotten along quite well.  He saw Dr. Gala Romney for preoperative evaluation  in March prior to having a hip replacement.  His lipids have been well-  controlled, blood pressure well-controlled.  He has started back doing some  exercise.  He was on Toprol perioperatively but he has not been taking it as  he thinks it has caused him fatigue in the past.   MEDICATIONS:  1. Niaspan 2 g h.s.  2. Glucosamine.  3. Lipitor 20.  4. Zetia 10.  5. Aspirin 162.  6. Mavik 4.  7. Flomax 0.4.   PHYSICAL EXAMINATION:  VITAL SIGNS:  Blood pressure 144/78, pulse 90, normal  sinus rhythm.  GENERAL APPEARANCE:  Normal.  NECK:  JVP is elevated.  Carotid pulses palpable without bruits.  LUNGS:  Clear.  CARDIAC:  Normal.  ABDOMEN:  Normal.  EXTREMITIES:  Normal.   LABORATORY DATA:  EKG:  Normal sinus rhythm, vertical axis, otherwise  normal.   Recent lab data revealed normal BNP.  Bilirubin is 1.7.  LDL was 75.   The patient appears to be getting along quite well.  We will see him again  in six months with a lipid, LFTs, BNP, and will also add at this point  Toprol XL 25, hoping he can tolerate this.  If not we will reduce it to  12.5.                             E. Graceann Congress, MD, [facility]    EJL/MedQ  DD:  05/23/2006  DT:  05/23/2006  Job #:  [id number]

## 2011-02-12 ENCOUNTER — Encounter: Payer: Self-pay | Admitting: Internal Medicine

## 2011-02-26 ENCOUNTER — Other Ambulatory Visit: Payer: Self-pay | Admitting: *Deleted

## 2011-02-26 MED ORDER — CLOPIDOGREL BISULFATE 75 MG PO TABS: 75.0000 mg | ORAL_TABLET | Freq: Every day | ORAL | Status: DC

## 2011-03-01 ENCOUNTER — Encounter: Payer: Self-pay | Admitting: Internal Medicine

## 2011-03-01 ENCOUNTER — Ambulatory Visit (INDEPENDENT_AMBULATORY_CARE_PROVIDER_SITE_OTHER): Payer: BC Managed Care – PPO | Admitting: Internal Medicine

## 2011-03-01 VITALS — BP 148/78 | HR 74 | Ht 67.0 in | Wt 194.0 lb

## 2011-03-01 DIAGNOSIS — E785 Hyperlipidemia, unspecified: Secondary | ICD-10-CM

## 2011-03-01 DIAGNOSIS — I10 Essential (primary) hypertension: Secondary | ICD-10-CM

## 2011-03-01 DIAGNOSIS — I2510 Atherosclerotic heart disease of native coronary artery without angina pectoris: Secondary | ICD-10-CM

## 2011-03-01 DIAGNOSIS — I6529 Occlusion and stenosis of unspecified carotid artery: Secondary | ICD-10-CM

## 2011-03-01 MED ORDER — SPIRONOLACTONE 25 MG PO TABS: 12.5000 mg | ORAL_TABLET | Freq: Every day | ORAL | Status: DC

## 2011-03-01 MED ORDER — RAMIPRIL 10 MG PO TABS: 10.0000 mg | ORAL_TABLET | Freq: Every day | ORAL | Status: DC

## 2011-03-01 MED ORDER — NIACIN ER (ANTIHYPERLIPIDEMIC) 1000 MG PO TBCR: 1000.0000 mg | EXTENDED_RELEASE_TABLET | Freq: Every day | ORAL | Status: DC

## 2011-03-01 NOTE — Assessment & Plan Note (Signed)
Followed in Lipid Clinic. LDL at goal. Continue current regimen.

## 2011-03-01 NOTE — Assessment & Plan Note (Signed)
Asymptomatic. Carotid stenosis is stable. F/u in 6 months. Long discussion about natural history of disease and when he would be a candidate for surgery. (> 80% stenosis). Repeat u/s in 6 months.

## 2011-03-01 NOTE — Patient Instructions (Signed)
Decrease Aspirin to 81 mg daily Start Spironolactone 25 mg 1/2 tab daily  Your physician recommends that you return for lab work in: 1 week (bmet 414.01, 401.1)  Your physician has requested that you have a carotid duplex. This test is an ultrasound of the carotid arteries in your neck. It looks at blood flow through these arteries that supply the brain with blood. Allow one hour for this exam. There are no restrictions or special instructions.  NEEDS IN 6 MONTHS.  Your physician wants you to follow-up in: 6 months.  You will receive a reminder letter in the mail two months in advance. If you don't receive a letter, please call our office to schedule the follow-up appointment.

## 2011-03-01 NOTE — Assessment & Plan Note (Signed)
No evidence of ischemia. Decrease ASA to 81. Continue current regimen.

## 2011-03-01 NOTE — Progress Notes (Signed)
HPI:  [name] is a very pleasant 70 year old male previously followed by Dr. Graceann [name].  He has a history of coronary artery disease.  He has multiple angioplasties to the RCA dating back to 1.  He also has a history of hypertension and hyperlipidemia and is followed by the Lipid Clinic.  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  He was hospitalized iun 11/11 because of recurrent chest pain. He underwent drug-eluting stent to the proximal RCA July 24, 2010. Ejection fraction was 65%. He also had 99% stenosis in the distal PDA with left to right collaterals and nonobstructive disease in the left coronary tree.  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 (stable)  He returns for f/u. The patient denies any further chest pain since his stent was placed.  He walks several miles twice a day several days a week. Active with yardwork.  Even doing some running.  He follows a strict diet and does not smoke. Follows with lipid clinic. LDL 66 in 2/12. BPs at home 145/70s   ROS: All systems negative except as listed in HPI, PMH and Problem List.  Past Medical History  Diagnosis Date  . CAD (coronary artery disease)     s/p multiple PCIs to RCA  . HTN (hypertension)   . Hyperlipidemia   . Carotid bruit     right    Current Outpatient Prescriptions  Medication Sig Dispense Refill  . acetaminophen (TYLENOL) 500 MG tablet Take 1,000 mg by mouth daily as needed.        Marland Kitchen amLODipine (NORVASC) 10 MG tablet Take 10 mg by mouth daily.        Marland Kitchen aspirin EC 325 MG tablet Take 325 mg by mouth daily.        Marland Kitchen atorvastatin (LIPITOR) 40 MG tablet Take 40 mg by mouth daily.        . clopidogrel (PLAVIX) 75 MG tablet Take 1 tablet (75 mg total) by mouth daily.  30 tablet  12  . Glucosamine HCl 1000 MG TABS Take 1 tablet by mouth daily.        . niacin (NIASPAN) 1000 MG CR tablet Take 1,000 mg by mouth at bedtime.        . nitroGLYCERIN (NITROSTAT) 0.4 MG SL  tablet Place 0.4 mg under the tongue every 5 (five) minutes as needed.        . Omega-3 Fatty Acids (FISH OIL DOUBLE STRENGTH) 1200 MG CAPS Take 2 capsules by mouth daily.        . ramipril (ALTACE) 10 MG tablet Take 10 mg by mouth daily.        . tadalafil (CIALIS) 5 MG tablet Take 5 mg by mouth daily as needed.        Marland Kitchen VITAMIN B COMPLEX-C PO Take by mouth daily.        Marland Kitchen DISCONTD: Tamsulosin HCl (FLOMAX) 0.4 MG CAPS Take by mouth. Alternate days of 1 tablet with 2 tablets          PHYSICAL EXAM: Filed Vitals:   03/01/11 0946  BP: 148/78  Pulse: 74   General:  Well appearing. No resp difficulty HEENT: normal Neck: supple. JVP flat. Carotids 2+ bilaterally; no bruits. No lymphadenopathy or thryomegaly appreciated. Cor: PMI normal. Regular rate & rhythm. No rubs, gallops or murmurs. Lungs: clear Abdomen: soft, nontender, nondistended. No hepatosplenomegaly. No bruits or masses. Good bowel sounds. Extremities: no cyanosis, clubbing, rash, edema Neuro: alert & orientedx3,  cranial nerves grossly intact. Moves all 4 extremities w/o difficulty. Affect pleasant.    ECG:  SR 74 No ST-T wave abnormalities.       ASSESSMENT & PLAN:

## 2011-03-01 NOTE — Assessment & Plan Note (Signed)
BP elevated. Add spiro 12.5 daily. Check BMET next week. Keep BP log and fax results to [place].

## 2011-04-09 ENCOUNTER — Other Ambulatory Visit: Payer: Self-pay | Admitting: *Deleted

## 2011-04-09 MED ORDER — AMLODIPINE BESYLATE 10 MG PO TABS: 10.0000 mg | ORAL_TABLET | Freq: Every day | ORAL | Status: DC

## 2011-04-15 ENCOUNTER — Other Ambulatory Visit: Payer: Self-pay | Admitting: *Deleted

## 2011-04-15 ENCOUNTER — Ambulatory Visit (INDEPENDENT_AMBULATORY_CARE_PROVIDER_SITE_OTHER): Payer: BC Managed Care – PPO | Admitting: Internal Medicine

## 2011-04-15 ENCOUNTER — Encounter: Payer: Self-pay | Admitting: Internal Medicine

## 2011-04-15 VITALS — BP 154/82 | HR 77 | Resp 14 | Ht 67.0 in | Wt 198.0 lb

## 2011-04-15 DIAGNOSIS — I10 Essential (primary) hypertension: Secondary | ICD-10-CM

## 2011-04-15 DIAGNOSIS — I2510 Atherosclerotic heart disease of native coronary artery without angina pectoris: Secondary | ICD-10-CM

## 2011-04-15 MED ORDER — SPIRONOLACTONE 25 MG PO TABS: 25.0000 mg | ORAL_TABLET | Freq: Every day | ORAL | Status: DC

## 2011-04-15 MED ORDER — LOSARTAN POTASSIUM 100 MG PO TABS: 100.0000 mg | ORAL_TABLET | Freq: Every day | ORAL | Status: DC

## 2011-04-15 NOTE — Patient Instructions (Signed)
Stop Ramipril Start Losartan 100 mg daily Increase Spironolactone to 25 mg daily  Your physician recommends that you return for lab work in: 2 weeks  Your physician recommends that you schedule a follow-up appointment in: 4 months.

## 2011-04-15 NOTE — Assessment & Plan Note (Signed)
Blood elevated and appears to have ACE-I cough. Will change ramipril to losartan 100. Increase spironolactone to 25 qd. Follow BPs closely. Check BMET 2 weeks.

## 2011-04-15 NOTE — Assessment & Plan Note (Signed)
No evidence of ischemia. Continue current regimen.   

## 2011-04-15 NOTE — Progress Notes (Signed)
PCP: Marjory Lies  HPI:  Sequ[place]ia is a very pleasant 70 year old male previously followed by Dr. Graceann Congress.  He has a history of coronary artery disease.  He has multiple angioplasties to the RCA dating back to 40.  He also has a history of hypertension and hyperlipidemia and is followed by the Lipid Clinic.  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  He was hospitalized iun 11/11 because of recurrent chest pain. He underwent drug-eluting stent to the proximal RCA July 24, 2010. Ejection fraction was 65%. He also had 99% stenosis in the distal PDA with left to right collaterals and nonobstructive disease in the left coronary tree.  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 (stable)   He returns for f/u. Doing very well. Active. Only complaint is a dry hacking cough. Following BPs regularly. Typically 140-160/70-80s. No neuro sx.    ROS: All systems negative except as listed in HPI, PMH and Problem List.  Past Medical History  Diagnosis Date  . CAD (coronary artery disease)     s/p multiple PCIs to RCA  . HTN (hypertension)   . Hyperlipidemia   . Carotid bruit     right    Current Outpatient Prescriptions  Medication Sig Dispense Refill  . acetaminophen (TYLENOL) 500 MG tablet Take 1,000 mg by mouth daily as needed.        Marland Kitchen amLODipine (NORVASC) 10 MG tablet Take 1 tablet (10 mg total) by mouth daily.  30 tablet  6  . aspirin 81 MG tablet Take 81 mg by mouth daily.        Marland Kitchen atorvastatin (LIPITOR) 40 MG tablet Take 40 mg by mouth daily.        . clopidogrel (PLAVIX) 75 MG tablet Take 1 tablet (75 mg total) by mouth daily.  30 tablet  12  . Glucosamine HCl 1000 MG TABS Take 1 tablet by mouth daily.        . niacin (NIASPAN) 1000 MG CR tablet Take 1 tablet (1,000 mg total) by mouth at bedtime.  30 tablet  12  . nitroGLYCERIN (NITROSTAT) 0.4 MG SL tablet Place 0.4 mg under the tongue every 5 (five) minutes as needed.        . Omega-3  Fatty Acids (FISH OIL DOUBLE STRENGTH) 1200 MG CAPS Take 2 capsules by mouth daily.        . ramipril (ALTACE) 10 MG tablet Take 1 tablet (10 mg total) by mouth daily.  30 tablet  12  . spironolactone (ALDACTONE) 25 MG tablet 1/2 tab po qd       . tadalafil (CIALIS) 5 MG tablet Take 5 mg by mouth daily as needed.        Marland Kitchen VITAMIN B COMPLEX-C PO Take by mouth daily.           PHYSICAL EXAM: Filed Vitals:   04/15/11 1629  BP: 154/82  Pulse: 77  Resp: 14   General:  Well appearing. No resp difficulty HEENT: normal Neck: supple. JVP flat. Carotids 2+ bilaterally; no bruits. No lymphadenopathy or thryomegaly appreciated. Cor: PMI normal. Regular rate & rhythm. No rubs, gallops or murmurs. Lungs: clear Abdomen: soft, nontender, nondistended. No hepatosplenomegaly. No bruits or masses. Good bowel sounds. Extremities: no cyanosis, clubbing, rash, tr edema Neuro: alert & orientedx3, cranial nerves grossly intact. Moves all 4 extremities w/o difficulty. Affect pleasant.    ECG:  SR 74 No ST-T wave abnormalities.  ASSESSMENT & PLAN:

## 2011-04-30 ENCOUNTER — Other Ambulatory Visit (INDEPENDENT_AMBULATORY_CARE_PROVIDER_SITE_OTHER): Payer: BC Managed Care – PPO

## 2011-04-30 DIAGNOSIS — I10 Essential (primary) hypertension: Secondary | ICD-10-CM

## 2011-04-30 LAB — BASIC METABOLIC PANEL
BUN: 10 mg/dL (ref 6–23)
CO2: 27 mEq/L (ref 19–32)
Calcium: 9.2 mg/dL (ref 8.4–10.5)
Chloride: 106 mEq/L (ref 96–112)
Creatinine, Ser: 0.7 mg/dL (ref 0.4–1.5)
GFR: 129.73 mL/min (ref >60.00)
Glucose, Bld: 105 mg/dL — ABNORMAL HIGH (ref 70–99)
Potassium: 3.9 mEq/L (ref 3.5–5.1)
Sodium: 140 mEq/L (ref 135–145)

## 2011-05-03 ENCOUNTER — Other Ambulatory Visit: Payer: BC Managed Care – PPO | Admitting: *Deleted

## 2011-06-16 ENCOUNTER — Encounter: Payer: Self-pay | Admitting: Nurse Practitioner

## 2011-06-16 ENCOUNTER — Other Ambulatory Visit: Payer: Self-pay | Admitting: Cardiology

## 2011-06-16 ENCOUNTER — Ambulatory Visit (INDEPENDENT_AMBULATORY_CARE_PROVIDER_SITE_OTHER): Payer: Medicare Other | Admitting: Nurse Practitioner

## 2011-06-16 DIAGNOSIS — I2510 Atherosclerotic heart disease of native coronary artery without angina pectoris: Secondary | ICD-10-CM

## 2011-06-16 DIAGNOSIS — R0789 Other chest pain: Secondary | ICD-10-CM

## 2011-06-16 DIAGNOSIS — Z01810 Encounter for preprocedural cardiovascular examination: Secondary | ICD-10-CM

## 2011-06-16 DIAGNOSIS — R079 Chest pain, unspecified: Secondary | ICD-10-CM

## 2011-06-16 DIAGNOSIS — I10 Essential (primary) hypertension: Secondary | ICD-10-CM

## 2011-06-16 LAB — BASIC METABOLIC PANEL
BUN: 14 mg/dL (ref 6–23)
CO2: 27 mEq/L (ref 19–32)
Calcium: 9.2 mg/dL (ref 8.4–10.5)
Chloride: 104 mEq/L (ref 96–112)
Creatinine, Ser: 0.8 mg/dL (ref 0.4–1.5)
GFR: 106.65 mL/min (ref >60.00)
Glucose, Bld: 107 mg/dL — ABNORMAL HIGH (ref 70–99)
Potassium: 3.9 mEq/L (ref 3.5–5.1)
Sodium: 138 mEq/L (ref 135–145)

## 2011-06-16 LAB — CBC WITH DIFFERENTIAL/PLATELET
Basophils Absolute: 0 10*3/uL (ref 0.0–0.1)
Basophils Relative: 0.7 % (ref 0.0–3.0)
Eosinophils Absolute: 0.2 10*3/uL (ref 0.0–0.7)
Eosinophils Relative: 3.9 % (ref 0.0–5.0)
HCT: 41.8 % (ref 39.0–52.0)
Hemoglobin: 14.2 g/dL (ref 13.0–17.0)
Lymphocytes Relative: 24.1 % (ref 12.0–46.0)
Lymphs Abs: 1.2 10*3/uL (ref 0.7–4.0)
MCHC: 34 g/dL (ref 30.0–36.0)
MCV: 101.2 fl — ABNORMAL HIGH (ref 78.0–100.0)
Monocytes Absolute: 0.7 10*3/uL (ref 0.1–1.0)
Monocytes Relative: 13.4 % — ABNORMAL HIGH (ref 3.0–12.0)
Neutro Abs: 2.9 10*3/uL (ref 1.4–7.7)
Neutrophils Relative %: 57.9 % (ref 43.0–77.0)
Platelets: 238 10*3/uL (ref 150.0–400.0)
RBC: 4.13 Mil/uL — ABNORMAL LOW (ref 4.22–5.81)
RDW: 13.7 % (ref 11.5–14.6)
WBC: 4.9 10*3/uL (ref 4.5–10.5)

## 2011-06-16 LAB — PROTIME-INR
INR: 0.88 (ref <1.50)
Prothrombin Time: 12.1 seconds (ref 11.6–15.2)

## 2011-06-16 LAB — APTT: aPTT: 38 seconds — ABNORMAL HIGH (ref 24–37)

## 2011-06-16 MED ORDER — METOPROLOL TARTRATE 25 MG PO TABS: 25.0000 mg | ORAL_TABLET | Freq: Two times a day (BID) | ORAL | Status: DC

## 2011-06-16 MED ORDER — NITROGLYCERIN 0.4 MG SL SUBL: 0.4000 mg | SUBLINGUAL_TABLET | SUBLINGUAL | Status: DC | PRN

## 2011-06-16 NOTE — Assessment & Plan Note (Signed)
Has had multiple caths and PCI's to the RCA. Repeat cath is planned for Friday.

## 2011-06-16 NOTE — Assessment & Plan Note (Signed)
Blood pressure is not at goal. I have added low dose metoprolol at 25 mg BID.

## 2011-06-16 NOTE — Patient Instructions (Signed)
You are scheduled for a cardiac catheterization on Friday October 5th with Dr. Peter Swaziland.  Go to [facility] 2nd Floor Short Stay on Friday at 7am. No food or drink after midnight on Thursday. You may take your medications with a sip of water on the day of your procedure.   I am going to put you on some low dose Metoprolol at 25 mg two times a day.  Angiography Angiography is a procedure used to look at the blood vessels (arteries) which carry the blood to different parts of your body. In this procedure a dye is injected through a catheter (a long, hollow tube about the size of a piece of cooked spaghetti) into an artery and x-rays are taken. The x-rays will show if there is a blockage or problem in a blood vessel.  PREPARATION FOR THE PROCEDURE  Let your caregiver know if you have had an allergy to dyes used in x-ray if you have ever had kidney problems or failure.   Do not eat or drink starting from midnight up to the time of the procedure, or as directed.   You may drink enough water to take your medications the morning of the procedure if you were instructed to do so.   You should be at the hospital or outpatient facility where the procedure is to be done 2hrs prior to the procedure or as directed.  PROCEDURE: 1. You may be given a medication to help you relax before and during the procedure through an IV in your hand or arm.  2. A local anesthetic to make the area numb may be used before inserting the catheter.  3. You will be prepared for the procedure by washing and shaving the area where the catheter will be inserted. This is usually done in the groin but may be done in the fold of your arm by your elbow.  4. A specially trained doctor will insert the catheter with a guide wire into an artery. This is guided under a special type of x-ray (fluoroscopy) to the blood vessel being examined.  5. Special dye is then injected and x-rays are taken. These will show where any narrowing or  blockages are located.  AFTER THE PROCEDURE  After the procedure you will be kept in bed for several hours.   The access site will be watched and you will be checked frequently.   Blood tests, other x-rays and an EKG may be done.   You may stay in the hospital overnight for observation.  SEEK IMMEDIATE MEDICAL CARE IF:  You develop chest pain, shortness of breath, feel faint, or pass out.   There is bleeding, swelling, or drainage from the catheter insertion site.   You develop pain, discoloration, coldness, or severe bruising in the leg or arm, or area where the catheter was inserted.   An oral temperature above 102 develops.  Document Released: 06/09/2005 Document Re-Released: 12/30/2007 [facility] Patient Information 2011 [place], [place].

## 2011-06-16 NOTE — Assessment & Plan Note (Signed)
He is having recurrent symptoms. Cardiac cath is indicated. He is willing to proceed. I have arranged for LCP with Dr. Swaziland for this Friday. NTG was refilled. He was instructed to go to the ER for any worsening of symptoms. Patient is agreeable to this plan and will call if any problems develop in the interim.

## 2011-06-16 NOTE — Progress Notes (Signed)
[name] Date of Birth: [birth year]   History of Present Illness: [name] is seen today for a work in visit. He is seen for Dr. Gala Romney. He has had 2 weeks of recurrent chest pain with exertion. It is localized a little higher in his chest and is not radiating to the jaw or shoulder as it has done previously. It is clearly exertional. He has to stop or slow down his activities and gets relief. He does not use NTG. He has an extensive history of CAD. Last procedure was almost one year ago. Blood pressure at home is not at goal.   Current Outpatient Prescriptions on File Prior to Visit  Medication Sig Dispense Refill  . acetaminophen (TYLENOL) 500 MG tablet Take 1,000 mg by mouth daily as needed.        Marland Kitchen amLODipine (NORVASC) 10 MG tablet Take 1 tablet (10 mg total) by mouth daily.  30 tablet  6  . aspirin 81 MG tablet Take 81 mg by mouth daily.        Marland Kitchen atorvastatin (LIPITOR) 40 MG tablet Take 40 mg by mouth daily.        . clopidogrel (PLAVIX) 75 MG tablet Take 1 tablet (75 mg total) by mouth daily.  30 tablet  12  . Glucosamine HCl 1000 MG TABS Take 1 tablet by mouth daily.        Marland Kitchen losartan (COZAAR) 100 MG tablet Take 1 tablet (100 mg total) by mouth daily.  30 tablet  6  . niacin (NIASPAN) 1000 MG CR tablet Take 1 tablet (1,000 mg total) by mouth at bedtime.  30 tablet  12  . Omega-3 Fatty Acids (FISH OIL DOUBLE STRENGTH) 1200 MG CAPS Take 2 capsules by mouth daily.        Marland Kitchen spironolactone (ALDACTONE) 25 MG tablet Take 1 tablet (25 mg total) by mouth daily.  30 tablet  6  . Tamsulosin HCl (FLOMAX) 0.4 MG CAPS Take 0.4 mg by mouth daily. Alternating 1-2 tablets daily       . VITAMIN B COMPLEX-C PO Take by mouth daily.        Marland Kitchen DISCONTD: nitroGLYCERIN (NITROSTAT) 0.4 MG SL tablet Place 0.4 mg under the tongue every 5 (five) minutes as needed.        . metoprolol tartrate (LOPRESSOR) 25 MG tablet Take 1 tablet (25 mg total) by mouth 2 (two) times daily.  60 tablet  11  . DISCONTD:  spironolactone (ALDACTONE) 25 MG tablet Take 0.5 tablets (12.5 mg total) by mouth daily.  30 tablet  12    Allergies  Allergen Reactions  . Morphine     Past Medical History  Diagnosis Date  . CAD (coronary artery disease)     s/p multiple PCIs to RCA dating back to 6. Last stent to proximal RCA in November of 2011. Also had 99% stenosis in the distal PDA with left to right collaterals and nonobstructive disease in the left coronary tree.   Marland Kitchen HTN (hypertension)   . Hyperlipidemia   . Carotid bruit     right    Past Surgical History  Procedure Date  . Left total hip replacement   . Coronary stent placement Nov 2011    prox RCA. Has had prior multiple procedures.    History  Smoking status  . Never Smoker   Smokeless tobacco  . Not on file    History  Alcohol Use  . 0.0 oz/week    occasionally  Family History  Problem Relation Age of Onset  . Heart disease Father     Review of Systems: The review of systems is positive for recurrent exertional chest pain.  All other systems were reviewed and are negative.  Physical Exam: BP 148/78  Pulse 76  Ht 5\' 7"  (1.702 m)  Wt 196 lb (88.905 kg)  BMI 30.70 kg/m2 Patient is very pleasant and in no acute distress. Little anxious. Skin is warm and dry. Color is normal.  HEENT is unremarkable. Normocephalic/atraumatic. PERRL. Sclera are nonicteric. Neck is supple. No masses. No JVD. Lungs are clear. Cardiac exam shows a regular rate and rhythm. Abdomen is soft. Extremities are without edema. Gait and ROM are intact. No gross neurologic deficits noted.  LABORATORY DATA: EKG shows sinus with nonspecific changes. Tracing was reviewed with Dr. Swaziland.  Assessment / Plan:

## 2011-06-16 NOTE — Progress Notes (Signed)
Addended by: Alma Friendly on: 06/16/2011 04:30 PM   Modules accepted: Orders

## 2011-06-17 ENCOUNTER — Telehealth: Payer: Self-pay | Admitting: *Deleted

## 2011-06-17 NOTE — Telephone Encounter (Signed)
Message copied by Lorayne Bender on Thu Jun 17, 2011  4:07 PM ------      Message from: Rosalio Macadamia      Created: Wed Jun 16, 2011  4:17 PM       Ok to report. Labs are satisfactory. OK for cath on Friday with Dr. Swaziland.

## 2011-06-17 NOTE — Telephone Encounter (Signed)
Notified of lab results. OK for cath tomorrow. Will send to Dr. Doristine Counter

## 2011-06-18 ENCOUNTER — Inpatient Hospital Stay (HOSPITAL_COMMUNITY)
Admission: RE | Admit: 2011-06-18 | Discharge: 2011-06-28 | DRG: 234 | Disposition: A | Discharge status: Home Health | Payer: Medicare Other | Source: Ambulatory Visit | Attending: Cardiothoracic Surgery | Admitting: Cardiothoracic Surgery

## 2011-06-18 ENCOUNTER — Encounter (HOSPITAL_COMMUNITY): Payer: Medicare Other

## 2011-06-18 DIAGNOSIS — E785 Hyperlipidemia, unspecified: Secondary | ICD-10-CM | POA: Diagnosis present

## 2011-06-18 DIAGNOSIS — D62 Acute posthemorrhagic anemia: Secondary | ICD-10-CM | POA: Diagnosis not present

## 2011-06-18 DIAGNOSIS — Z96649 Presence of unspecified artificial hip joint: Secondary | ICD-10-CM

## 2011-06-18 DIAGNOSIS — E876 Hypokalemia: Secondary | ICD-10-CM | POA: Diagnosis not present

## 2011-06-18 DIAGNOSIS — J9819 Other pulmonary collapse: Secondary | ICD-10-CM | POA: Diagnosis not present

## 2011-06-18 DIAGNOSIS — I2510 Atherosclerotic heart disease of native coronary artery without angina pectoris: Secondary | ICD-10-CM

## 2011-06-18 DIAGNOSIS — I200 Unstable angina: Secondary | ICD-10-CM | POA: Diagnosis present

## 2011-06-18 DIAGNOSIS — Y849 Medical procedure, unspecified as the cause of abnormal reaction of the patient, or of later complication, without mention of misadventure at the time of the procedure: Secondary | ICD-10-CM | POA: Diagnosis present

## 2011-06-18 DIAGNOSIS — T82897A Other specified complication of cardiac prosthetic devices, implants and grafts, initial encounter: Secondary | ICD-10-CM | POA: Diagnosis present

## 2011-06-19 ENCOUNTER — Inpatient Hospital Stay (HOSPITAL_COMMUNITY): Payer: Medicare Other

## 2011-06-19 DIAGNOSIS — Z01810 Encounter for preprocedural cardiovascular examination: Secondary | ICD-10-CM

## 2011-06-19 LAB — HEMOGLOBIN A1C
Hgb A1c MFr Bld: 5.6 % (ref <5.7)
Mean Plasma Glucose: 114 mg/dL (ref <117)

## 2011-06-19 LAB — CBC
HCT: 36.8 % — ABNORMAL LOW (ref 39.0–52.0)
Hemoglobin: 13.1 g/dL (ref 13.0–17.0)
MCH: 33.9 pg (ref 26.0–34.0)
MCHC: 35.6 g/dL (ref 30.0–36.0)
MCV: 95.3 fL (ref 78.0–100.0)
Platelets: 172 10*3/uL (ref 150–400)
RBC: 3.86 MIL/uL — ABNORMAL LOW (ref 4.22–5.81)
RDW: 12.8 % (ref 11.5–15.5)
WBC: 6.8 10*3/uL (ref 4.0–10.5)

## 2011-06-19 LAB — COMPREHENSIVE METABOLIC PANEL
ALT: 20 U/L (ref 0–53)
AST: 22 U/L (ref 0–37)
Albumin: 3.7 g/dL (ref 3.5–5.2)
Alkaline Phosphatase: 50 U/L (ref 39–117)
BUN: 13 mg/dL (ref 6–23)
CO2: 26 mEq/L (ref 19–32)
Calcium: 9.1 mg/dL (ref 8.4–10.5)
Chloride: 106 mEq/L (ref 96–112)
Creatinine, Ser: 0.61 mg/dL (ref 0.50–1.35)
GFR calc Af Amer: >90 mL/min (ref >90)
GFR calc non Af Amer: >90 mL/min (ref >90)
Glucose, Bld: 106 mg/dL — ABNORMAL HIGH (ref 70–99)
Potassium: 4.1 mEq/L (ref 3.5–5.1)
Sodium: 138 mEq/L (ref 135–145)
Total Bilirubin: 1 mg/dL (ref 0.3–1.2)
Total Protein: 6.3 g/dL (ref 6.0–8.3)

## 2011-06-19 LAB — SURGICAL PCR SCREEN
MRSA, PCR: POSITIVE — AB
Staphylococcus aureus: POSITIVE — AB

## 2011-06-19 LAB — HEPARIN LEVEL (UNFRACTIONATED)
Heparin Unfractionated: 0.17 IU/mL — ABNORMAL LOW (ref 0.30–0.70)
Heparin Unfractionated: 0.36 IU/mL (ref 0.30–0.70)

## 2011-06-19 LAB — TSH: TSH: 2.571 u[IU]/mL (ref 0.350–4.500)

## 2011-06-19 LAB — PLATELET INHIBITION P2Y12: Platelet Function  P2Y12: 172 [PRU] — ABNORMAL LOW (ref 194–418)

## 2011-06-20 DIAGNOSIS — R072 Precordial pain: Secondary | ICD-10-CM

## 2011-06-20 LAB — BLOOD GAS, ARTERIAL
Acid-Base Excess: 0.3 mmol/L (ref 0.0–2.0)
Bicarbonate: 24 mEq/L (ref 20.0–24.0)
Drawn by: 246861
FIO2: 0.21 %
O2 Saturation: 98 %
Patient temperature: 98.6
TCO2: 25.1 mmol/L (ref 0–100)
pCO2 arterial: 36 mmHg (ref 35.0–45.0)
pH, Arterial: 7.439 (ref 7.350–7.450)
pO2, Arterial: 93.3 mmHg (ref 80.0–100.0)

## 2011-06-20 LAB — CBC
HCT: 37.4 % — ABNORMAL LOW (ref 39.0–52.0)
Hemoglobin: 13.6 g/dL (ref 13.0–17.0)
MCH: 34.6 pg — ABNORMAL HIGH (ref 26.0–34.0)
MCHC: 36.4 g/dL — ABNORMAL HIGH (ref 30.0–36.0)
MCV: 95.2 fL (ref 78.0–100.0)
Platelets: 176 10*3/uL (ref 150–400)
RBC: 3.93 MIL/uL — ABNORMAL LOW (ref 4.22–5.81)
RDW: 12.6 % (ref 11.5–15.5)
WBC: 7.5 10*3/uL (ref 4.0–10.5)

## 2011-06-20 LAB — PLATELET INHIBITION P2Y12: Platelet Function  P2Y12: 186 [PRU] — ABNORMAL LOW (ref 194–418)

## 2011-06-20 LAB — ABO/RH: ABO/RH(D): A POS

## 2011-06-20 LAB — HEPARIN LEVEL (UNFRACTIONATED): Heparin Unfractionated: 0.29 IU/mL — ABNORMAL LOW (ref 0.30–0.70)

## 2011-06-21 ENCOUNTER — Inpatient Hospital Stay (HOSPITAL_COMMUNITY): Payer: Medicare Other

## 2011-06-21 DIAGNOSIS — I2510 Atherosclerotic heart disease of native coronary artery without angina pectoris: Secondary | ICD-10-CM

## 2011-06-21 LAB — URINALYSIS, ROUTINE W REFLEX MICROSCOPIC
Bilirubin Urine: NEGATIVE
Glucose, UA: NEGATIVE mg/dL
Hgb urine dipstick: NEGATIVE
Ketones, ur: NEGATIVE mg/dL
Leukocytes, UA: NEGATIVE
Nitrite: NEGATIVE
Protein, ur: NEGATIVE mg/dL
Specific Gravity, Urine: 1.009 (ref 1.005–1.030)
Urobilinogen, UA: 0.2 mg/dL (ref 0.0–1.0)
pH: 6 (ref 5.0–8.0)

## 2011-06-21 LAB — CBC
HCT: 38.7 % — ABNORMAL LOW (ref 39.0–52.0)
Hemoglobin: 13.7 g/dL (ref 13.0–17.0)
MCH: 33.8 pg (ref 26.0–34.0)
MCHC: 35.4 g/dL (ref 30.0–36.0)
MCV: 95.6 fL (ref 78.0–100.0)
Platelets: 193 10*3/uL (ref 150–400)
RBC: 4.05 MIL/uL — ABNORMAL LOW (ref 4.22–5.81)
RDW: 12.6 % (ref 11.5–15.5)
WBC: 6.4 10*3/uL (ref 4.0–10.5)

## 2011-06-21 LAB — HEMOGLOBIN A1C
Hgb A1c MFr Bld: 5.6 % (ref <5.7)
Mean Plasma Glucose: 114 mg/dL (ref <117)

## 2011-06-21 LAB — BASIC METABOLIC PANEL
BUN: 11 mg/dL (ref 6–23)
CO2: 27 mEq/L (ref 19–32)
Calcium: 9.5 mg/dL (ref 8.4–10.5)
Chloride: 103 mEq/L (ref 96–112)
Creatinine, Ser: 0.7 mg/dL (ref 0.50–1.35)
GFR calc Af Amer: >90 mL/min (ref >90)
GFR calc non Af Amer: >90 mL/min (ref >90)
Glucose, Bld: 134 mg/dL — ABNORMAL HIGH (ref 70–99)
Potassium: 4 mEq/L (ref 3.5–5.1)
Sodium: 140 mEq/L (ref 135–145)

## 2011-06-21 LAB — HEPARIN LEVEL (UNFRACTIONATED): Heparin Unfractionated: 0.36 IU/mL (ref 0.30–0.70)

## 2011-06-22 ENCOUNTER — Inpatient Hospital Stay (HOSPITAL_COMMUNITY): Payer: Medicare Other

## 2011-06-22 DIAGNOSIS — I2510 Atherosclerotic heart disease of native coronary artery without angina pectoris: Secondary | ICD-10-CM

## 2011-06-22 LAB — POCT I-STAT, CHEM 8
BUN: 9 mg/dL (ref 6–23)
Calcium, Ion: 1.2 mmol/L (ref 1.12–1.32)
Chloride: 105 mEq/L (ref 96–112)
Creatinine, Ser: 0.8 mg/dL (ref 0.50–1.35)
Glucose, Bld: 124 mg/dL — ABNORMAL HIGH (ref 70–99)
HCT: 25 % — ABNORMAL LOW (ref 39.0–52.0)
Hemoglobin: 8.5 g/dL — ABNORMAL LOW (ref 13.0–17.0)
Potassium: 4.3 mEq/L (ref 3.5–5.1)
Sodium: 141 mEq/L (ref 135–145)
TCO2: 22 mmol/L (ref 0–100)

## 2011-06-22 LAB — CBC
HCT: 36.4 % — ABNORMAL LOW (ref 39.0–52.0)
HCT: 26.5 % — ABNORMAL LOW (ref 39.0–52.0)
HCT: 24.1 % — ABNORMAL LOW (ref 39.0–52.0)
Hemoglobin: 13.1 g/dL (ref 13.0–17.0)
Hemoglobin: 9.6 g/dL — ABNORMAL LOW (ref 13.0–17.0)
Hemoglobin: 8.7 g/dL — ABNORMAL LOW (ref 13.0–17.0)
MCH: 33.9 pg (ref 26.0–34.0)
MCH: 33.9 pg (ref 26.0–34.0)
MCH: 33.9 pg (ref 26.0–34.0)
MCHC: 36 g/dL (ref 30.0–36.0)
MCHC: 36.2 g/dL — ABNORMAL HIGH (ref 30.0–36.0)
MCHC: 36.1 g/dL — ABNORMAL HIGH (ref 30.0–36.0)
MCV: 94.1 fL (ref 78.0–100.0)
MCV: 93.6 fL (ref 78.0–100.0)
MCV: 93.8 fL (ref 78.0–100.0)
Platelets: 198 10*3/uL (ref 150–400)
Platelets: 116 10*3/uL — ABNORMAL LOW (ref 150–400)
Platelets: 139 10*3/uL — ABNORMAL LOW (ref 150–400)
RBC: 3.87 MIL/uL — ABNORMAL LOW (ref 4.22–5.81)
RBC: 2.83 MIL/uL — ABNORMAL LOW (ref 4.22–5.81)
RBC: 2.57 MIL/uL — ABNORMAL LOW (ref 4.22–5.81)
RDW: 12.6 % (ref 11.5–15.5)
RDW: 12.4 % (ref 11.5–15.5)
RDW: 12.6 % (ref 11.5–15.5)
WBC: 8.4 10*3/uL (ref 4.0–10.5)
WBC: 11.9 10*3/uL — ABNORMAL HIGH (ref 4.0–10.5)
WBC: 12.3 10*3/uL — ABNORMAL HIGH (ref 4.0–10.5)

## 2011-06-22 LAB — POCT I-STAT 3, ART BLOOD GAS (G3+)
Acid-Base Excess: 1 mmol/L (ref 0.0–2.0)
Acid-Base Excess: 1 mmol/L (ref 0.0–2.0)
Acid-base deficit: 1 mmol/L (ref 0.0–2.0)
Acid-base deficit: 1 mmol/L (ref 0.0–2.0)
Acid-base deficit: 1 mmol/L (ref 0.0–2.0)
Bicarbonate: 26.2 mEq/L — ABNORMAL HIGH (ref 20.0–24.0)
Bicarbonate: 26 mEq/L — ABNORMAL HIGH (ref 20.0–24.0)
Bicarbonate: 23.5 mEq/L (ref 20.0–24.0)
Bicarbonate: 22.7 mEq/L (ref 20.0–24.0)
Bicarbonate: 23.8 mEq/L (ref 20.0–24.0)
Bicarbonate: 23.4 mEq/L (ref 20.0–24.0)
O2 Saturation: 100 %
O2 Saturation: 100 %
O2 Saturation: 95 %
O2 Saturation: 100 %
O2 Saturation: 99 %
O2 Saturation: 97 %
Patient temperature: 36.1
Patient temperature: 36.4
Patient temperature: 37.1
Patient temperature: 37.2
TCO2: 27 mmol/L (ref 0–100)
TCO2: 27 mmol/L (ref 0–100)
TCO2: 25 mmol/L (ref 0–100)
TCO2: 24 mmol/L (ref 0–100)
TCO2: 25 mmol/L (ref 0–100)
TCO2: 25 mmol/L (ref 0–100)
pCO2 arterial: 42.3 mmHg (ref 35.0–45.0)
pCO2 arterial: 39.4 mmHg (ref 35.0–45.0)
pCO2 arterial: 32.8 mmHg — ABNORMAL LOW (ref 35.0–45.0)
pCO2 arterial: 31.4 mmHg — ABNORMAL LOW (ref 35.0–45.0)
pCO2 arterial: 39.5 mmHg (ref 35.0–45.0)
pCO2 arterial: 39 mmHg (ref 35.0–45.0)
pH, Arterial: 7.4 (ref 7.350–7.450)
pH, Arterial: 7.428 (ref 7.350–7.450)
pH, Arterial: 7.46 — ABNORMAL HIGH (ref 7.350–7.450)
pH, Arterial: 7.464 — ABNORMAL HIGH (ref 7.350–7.450)
pH, Arterial: 7.388 (ref 7.350–7.450)
pH, Arterial: 7.387 (ref 7.350–7.450)
pO2, Arterial: 343 mmHg — ABNORMAL HIGH (ref 80.0–100.0)
pO2, Arterial: 249 mmHg — ABNORMAL HIGH (ref 80.0–100.0)
pO2, Arterial: 66 mmHg — ABNORMAL LOW (ref 80.0–100.0)
pO2, Arterial: 161 mmHg — ABNORMAL HIGH (ref 80.0–100.0)
pO2, Arterial: 120 mmHg — ABNORMAL HIGH (ref 80.0–100.0)
pO2, Arterial: 89 mmHg (ref 80.0–100.0)

## 2011-06-22 LAB — POCT I-STAT 4, (NA,K, GLUC, HGB,HCT)
Glucose, Bld: 104 mg/dL — ABNORMAL HIGH (ref 70–99)
Glucose, Bld: 115 mg/dL — ABNORMAL HIGH (ref 70–99)
Glucose, Bld: 95 mg/dL (ref 70–99)
Glucose, Bld: 127 mg/dL — ABNORMAL HIGH (ref 70–99)
Glucose, Bld: 147 mg/dL — ABNORMAL HIGH (ref 70–99)
Glucose, Bld: 179 mg/dL — ABNORMAL HIGH (ref 70–99)
Glucose, Bld: 129 mg/dL — ABNORMAL HIGH (ref 70–99)
HCT: 32 % — ABNORMAL LOW (ref 39.0–52.0)
HCT: 23 % — ABNORMAL LOW (ref 39.0–52.0)
HCT: 30 % — ABNORMAL LOW (ref 39.0–52.0)
HCT: 23 % — ABNORMAL LOW (ref 39.0–52.0)
HCT: 21 % — ABNORMAL LOW (ref 39.0–52.0)
HCT: 26 % — ABNORMAL LOW (ref 39.0–52.0)
HCT: 27 % — ABNORMAL LOW (ref 39.0–52.0)
Hemoglobin: 10.9 g/dL — ABNORMAL LOW (ref 13.0–17.0)
Hemoglobin: 10.2 g/dL — ABNORMAL LOW (ref 13.0–17.0)
Hemoglobin: 7.8 g/dL — ABNORMAL LOW (ref 13.0–17.0)
Hemoglobin: 7.8 g/dL — ABNORMAL LOW (ref 13.0–17.0)
Hemoglobin: 7.1 g/dL — ABNORMAL LOW (ref 13.0–17.0)
Hemoglobin: 8.8 g/dL — ABNORMAL LOW (ref 13.0–17.0)
Hemoglobin: 9.2 g/dL — ABNORMAL LOW (ref 13.0–17.0)
Potassium: 3.9 mEq/L (ref 3.5–5.1)
Potassium: 4 mEq/L (ref 3.5–5.1)
Potassium: 4.1 mEq/L (ref 3.5–5.1)
Potassium: 5.1 mEq/L (ref 3.5–5.1)
Potassium: 3.7 mEq/L (ref 3.5–5.1)
Potassium: 4.1 mEq/L (ref 3.5–5.1)
Potassium: 3.7 mEq/L (ref 3.5–5.1)
Sodium: 138 mEq/L (ref 135–145)
Sodium: 130 mEq/L — ABNORMAL LOW (ref 135–145)
Sodium: 138 mEq/L (ref 135–145)
Sodium: 132 mEq/L — ABNORMAL LOW (ref 135–145)
Sodium: 137 mEq/L (ref 135–145)
Sodium: 139 mEq/L (ref 135–145)
Sodium: 140 mEq/L (ref 135–145)

## 2011-06-22 LAB — POCT I-STAT 3, VENOUS BLOOD GAS (G3P V)
Acid-base deficit: 2 mmol/L (ref 0.0–2.0)
Bicarbonate: 24.4 mEq/L — ABNORMAL HIGH (ref 20.0–24.0)
O2 Saturation: 82 %
TCO2: 26 mmol/L (ref 0–100)
pCO2, Ven: 45.6 mmHg (ref 45.0–50.0)
pH, Ven: 7.336 — ABNORMAL HIGH (ref 7.250–7.300)
pO2, Ven: 50 mmHg — ABNORMAL HIGH (ref 30.0–45.0)

## 2011-06-22 LAB — CREATININE, SERUM
Creatinine, Ser: 0.56 mg/dL (ref 0.50–1.35)
GFR calc Af Amer: >90 mL/min (ref >90)
GFR calc non Af Amer: >90 mL/min (ref >90)

## 2011-06-22 LAB — HEMOGLOBIN AND HEMATOCRIT, BLOOD
HCT: 22.9 % — ABNORMAL LOW (ref 39.0–52.0)
Hemoglobin: 8.3 g/dL — ABNORMAL LOW (ref 13.0–17.0)

## 2011-06-22 LAB — PROTIME-INR
INR: 1.37 (ref 0.00–1.49)
Prothrombin Time: 17.1 seconds — ABNORMAL HIGH (ref 11.6–15.2)

## 2011-06-22 LAB — POCT I-STAT GLUCOSE
Glucose, Bld: 123 mg/dL — ABNORMAL HIGH (ref 70–99)
Operator id: 3342

## 2011-06-22 LAB — MAGNESIUM: Magnesium: 2.9 mg/dL — ABNORMAL HIGH (ref 1.5–2.5)

## 2011-06-22 LAB — PLATELET COUNT: Platelets: 124 10*3/uL — ABNORMAL LOW (ref 150–400)

## 2011-06-22 LAB — APTT: aPTT: 38 seconds — ABNORMAL HIGH (ref 24–37)

## 2011-06-22 NOTE — Cardiovascular Report (Signed)
NAME:  [name], [name] NO.:  [id number]  MEDICAL RECORD NO.:  [id number]  LOCATION:  3735                         FACILITY:  [facility]  PHYSICIAN:  Donivan Thammavong M. Swaziland, M.D.  DATE OF BIRTH:  07-03-1941  DATE OF PROCEDURE:  06/18/2011 DATE OF DISCHARGE:                           CARDIAC CATHETERIZATION   INDICATIONS FOR PROCEDURE:  The patient is a 70 year old white male who is 1 year status post stenting of the proximal right coronary artery. This was performed in November 2011.  He presents with new onset of angina 2 weeks' duration.  PROCEDURES:  Left heart catheterization, coronary and left ventricular angiography.  ACCESS:  Via the right femoral artery using standard Seldinger technique.  EQUIPMENT:  A 5-French pigtail catheter, 5-French right Amplatz I catheter, 5-French arterial sheath.  MEDICATIONS:  Local anesthesia with 1% Xylocaine, Versed 1 mg IV, fentanyl 25 mcg IV.  CONTRAST:  90 mL of Omnipaque.  HEMODYNAMIC DATA:  Aortic pressure was 126/54 with a mean of 83 mmHg. Left ventricle pressure was 126 with an EDP of 12 mmHg.  ANGIOGRAPHIC DATA:  The right coronary artery arises and distributes normally.  It is a dominant vessel.  There is a 99% stenosis proximally within the stented segment.  The remainder of the stented segment appears to be without significant disease.  The mid right coronary artery has diffuse 80-90% stenosis extending past this crux.  It was very tortuous.  It was calcified.  There were 70% stenosis at the bifurcation of the PDA and the posterolateral branch.  Posterior descending artery has segmental 90% stenosis.  It fills by left-to-right collaterals.  The left anterior descending artery arises anomalously from the right coronary cusp.  It has diffuse 40% narrowing proximally.  The vessel extends around the apex and supplies excellent collaterals to the distal right coronary artery.  The left circumflex coronary has a  shared ostia of the LAD from the right coronary artery cusp.  Given its location, I could never directly engage the vessel, but flush shots demonstrated the circumflex giving rise to two marginal branches.  There appeared to be a 70% stenosis in the first marginal branch.  Left ventricular angiography performed in the RAO view demonstrated normal left ventricular size and contractility.  Ejection fraction was estimated at 55-60%.  FINAL INTERPRETATION: 1. Two-vessel obstructive atherosclerotic coronary artery disease.     The patient has in-stent restenosis in the proximal right coronary     artery and significant progression of disease in the midvessel.     This is not suitable for repeat catheter intervention. 2. Good left ventricular function.  PLAN:  I would recommend coronary artery bypass surgery.  Given his unstable symptoms, we will admit him to telemetry.  His Plavix will be held.  He will be covered with IV nitroglycerin, heparin, and we will obtain a CT and Surgery consult.          ______________________________ Jannelly Bergren M. [place]az[place]and, M.D.    PMJ/MEDQ  D:  06/18/2011  T:  06/18/2011  Job:  [id number]  cc:   Bevelyn Buckles. Bensimhon, MD Marjory Lies, M.D.  Electronically Signed by Nathanel Tallman Swaziland M.D. on 06/22/2011 01:04:42 PM

## 2011-06-22 NOTE — Consult Note (Signed)
NAME:  [name], [name] NO.:  [id number]  MEDICAL RECORD NO.:  [id number]  LOCATION:  3735                         FACILITY:  [facility]  PHYSICIAN:  Kerin Perna, M.D.  DATE OF BIRTH:  01-05-1941  DATE OF CONSULTATION:  06/18/2011 DATE OF DISCHARGE:                                CONSULTATION   REQUESTING PHYSICIAN:  Mitchel Delduca M. Swaziland, MD  PRIMARY CARDIOLOGIST:  Arvilla Meres, MD  PRIMARY CARE PHYSICIAN:  Marjory Lies, MD  REASON FOR CONSULTATION:  Severe recurrent multivessel coronary artery disease with class III unstable angina.  CHIEF COMPLAINT:  Exertional chest pain.  HISTORY OF PRESENT ILLNESS:  I was asked to evaluate this 70 year old retired Caucasian male nondiabetic, nonsmoker for possible multivessel bypass grafting.  The patient has a long history of coronary artery disease and coronary interventions since 1983.  One year ago, he had a drug-eluting stent placed in the ostium of the right coronary.  He has been on Plavix every day.  Over the past 2 weeks, he has had recurrent exertional chest pain without radiation to the jaw but with reproduction with any exertional activity and relieved by rest.  He does not use nitroglycerin.  He is followed carefully in [facility] office.  He was brought in for a cardiac cath, re-looked today which showed high- grade in-stent stenosis 99% of the proximal right coronary artery. There is also some distal disease and significant stenosis at the takeoff of the posterior descending.  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  The ostium of the left main also appears hazy.  The circumflex has significant disease in the OM1.  EF is 55%.  LVEDP is 11 mmHg.  The ventriculogram showed no evidence of mitral regurgitation.  A 2-D echo is pending.  Based on the patient's symptoms and coronary anatomy and the fact that he has had restenosis with good percutaneous  intervention, surgical revascularization has been recommended.  PAST MEDICAL HISTORY: 1. Hypertension. 2. Dyslipidemia. 3. 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 4. Right carotid bruit. 5. Left hip replacement.  HOME MEDICATIONS: 1. Amlodipine 10 mg daily. 2. Aspirin 81 mg daily. 3. Plavix 75 mg daily. 4. Lipitor 40 mg daily. 5. Losartan 100 mg daily. 6. Niacin 1 g p.o. daily. 7. Omega fatty acids 2 capsules daily. 8. Aldactone 25 mg daily. 9. Flomax 0.4 mg daily. 10.Nitroglycerin p.r.n. 11.Lopressor 25 mg b.i.d. 12.Spirolactone 25 mg daily.  SOCIAL HISTORY:  The patient is a retired IT trainer.  He lives with his wife. He is active in his yard and enjoys playing golf.  He travels.  FAMILY HISTORY:  Positive for coronary artery disease and his father has had bypass surgery twice.  REVIEW OF SYSTEMS:  CONSTITUTIONAL:  Negative for fever or weight loss. ENT:  Negative for difficulty swallowing, change in vision or dental complaints.  THORACIC:  Negative for history of thoracic trauma, abnormal chest x-ray productive for cough or hemoptysis.  GI:  Negative for jaundice, hepatitis, blood per rectum.  NEUROLOGIC:  Positive for BPH.  VASCULAR:  Negative for DVT.  He has a history of right carotid bruit followed by carotid  ultrasound in [facility] office without significant stenosis.  NEUROLOGIC:  Negative for stroke or seizure. ENDOCRINE:  Negative for diabetes.  PHYSICAL EXAMINATION:  VITAL SIGNS:  The patient is 5 feet 7, weight is 88 kg, blood pressure 155/80, heart rate 84 sinus. GENERAL APPEARANCE:  A middle-aged Caucasian male in the hospital room following cardiac cath, no distress. HEENT:  Normocephalic. NECK:  Without JVD.  There is right carotid bruit. LYMPHATICS:  No palpable cervical adenopathy. RESPIRATORY:  Breath sounds are clear and equal.  There is no thoracic deformity. CARDIAC:  Rhythm is regular without S3 gallop or murmur. ABDOMEN:  Soft, nontender without pulsatile  mass. EXTREMITIES:  No clubbing, cyanosis or edema.  There is no evidence of venous insufficiency of the lower extremities.  He has a compression dressing in the right groin from the cardiac cath site. NEUROLOGIC:  Alert and oriented without focal motor deficit.  LABORATORY DATA:  His hematocrit is 41.8, platelet count 200,000, white count 4.9, BUN and creatinine normal, serum creatinine 0.8.  IMPRESSION AND PLAN:  The patient has recurrent severe multivessel disease would benefit from multi bypass grafting with bypass grafts planned to the distal PD/PL, OM, LAD.  We will schedule surgery according to his P2Y12 platelet assay.     Kerin Perna, M.D.    PV/MEDQ  D:  06/18/2011  T:  06/19/2011  Job:  [id number]  Electronically Signed by Kerin Perna M.D. on 06/22/2011 52:84:13 PM

## 2011-06-23 ENCOUNTER — Inpatient Hospital Stay (HOSPITAL_COMMUNITY): Payer: Medicare Other

## 2011-06-23 DIAGNOSIS — E1165 Type 2 diabetes mellitus with hyperglycemia: Secondary | ICD-10-CM

## 2011-06-23 DIAGNOSIS — IMO0001 Reserved for inherently not codable concepts without codable children: Secondary | ICD-10-CM

## 2011-06-23 HISTORY — PX: OTHER SURGICAL HISTORY: SHX169

## 2011-06-23 LAB — GLUCOSE, CAPILLARY
Glucose-Capillary: 104 mg/dL — ABNORMAL HIGH (ref 70–99)
Glucose-Capillary: 105 mg/dL — ABNORMAL HIGH (ref 70–99)
Glucose-Capillary: 111 mg/dL — ABNORMAL HIGH (ref 70–99)
Glucose-Capillary: 111 mg/dL — ABNORMAL HIGH (ref 70–99)
Glucose-Capillary: 113 mg/dL — ABNORMAL HIGH (ref 70–99)
Glucose-Capillary: 116 mg/dL — ABNORMAL HIGH (ref 70–99)
Glucose-Capillary: 117 mg/dL — ABNORMAL HIGH (ref 70–99)
Glucose-Capillary: 122 mg/dL — ABNORMAL HIGH (ref 70–99)
Glucose-Capillary: 137 mg/dL — ABNORMAL HIGH (ref 70–99)
Glucose-Capillary: 80 mg/dL (ref 70–99)
Glucose-Capillary: 88 mg/dL (ref 70–99)
Glucose-Capillary: 162 mg/dL — ABNORMAL HIGH (ref 70–99)
Glucose-Capillary: 171 mg/dL — ABNORMAL HIGH (ref 70–99)
Glucose-Capillary: 146 mg/dL — ABNORMAL HIGH (ref 70–99)

## 2011-06-23 LAB — PREPARE FRESH FROZEN PLASMA: Unit division: 0

## 2011-06-23 LAB — PREPARE PLATELET PHERESIS: Unit division: 0

## 2011-06-23 LAB — POCT I-STAT, CHEM 8
BUN: 15 mg/dL (ref 6–23)
Calcium, Ion: 1.14 mmol/L (ref 1.12–1.32)
Chloride: 105 meq/L (ref 96–112)
Creatinine, Ser: 1.2 mg/dL (ref 0.50–1.35)
Glucose, Bld: 169 mg/dL — ABNORMAL HIGH (ref 70–99)
HCT: 24 % — ABNORMAL LOW (ref 39.0–52.0)
Hemoglobin: 8.2 g/dL — ABNORMAL LOW (ref 13.0–17.0)
Potassium: 4.2 meq/L (ref 3.5–5.1)
Sodium: 134 meq/L — ABNORMAL LOW (ref 135–145)
TCO2: 19 mmol/L (ref 0–100)

## 2011-06-23 LAB — CBC
HCT: 20 % — ABNORMAL LOW (ref 39.0–52.0)
HCT: 23.8 % — ABNORMAL LOW (ref 39.0–52.0)
Hemoglobin: 7.3 g/dL — ABNORMAL LOW (ref 13.0–17.0)
Hemoglobin: 8.5 g/dL — ABNORMAL LOW (ref 13.0–17.0)
MCH: 34.3 pg — ABNORMAL HIGH (ref 26.0–34.0)
MCH: 33.3 pg (ref 26.0–34.0)
MCHC: 36.5 g/dL — ABNORMAL HIGH (ref 30.0–36.0)
MCHC: 35.7 g/dL (ref 30.0–36.0)
MCV: 93.9 fL (ref 78.0–100.0)
MCV: 93.3 fL (ref 78.0–100.0)
Platelets: 127 10*3/uL — ABNORMAL LOW (ref 150–400)
Platelets: 121 10*3/uL — ABNORMAL LOW (ref 150–400)
RBC: 2.13 MIL/uL — ABNORMAL LOW (ref 4.22–5.81)
RBC: 2.55 MIL/uL — ABNORMAL LOW (ref 4.22–5.81)
RDW: 12.7 % (ref 11.5–15.5)
RDW: 13.7 % (ref 11.5–15.5)
WBC: 9.7 10*3/uL (ref 4.0–10.5)
WBC: 11.1 10*3/uL — ABNORMAL HIGH (ref 4.0–10.5)

## 2011-06-23 LAB — BASIC METABOLIC PANEL WITH GFR
BUN: 10 mg/dL (ref 6–23)
CO2: 25 meq/L (ref 19–32)
Calcium: 7.9 mg/dL — ABNORMAL LOW (ref 8.4–10.5)
Chloride: 109 meq/L (ref 96–112)
Creatinine, Ser: 0.65 mg/dL (ref 0.50–1.35)
GFR calc Af Amer: >90 mL/min
GFR calc non Af Amer: >90 mL/min
Glucose, Bld: 93 mg/dL (ref 70–99)
Potassium: 3.8 meq/L (ref 3.5–5.1)
Sodium: 140 meq/L (ref 135–145)

## 2011-06-23 LAB — MAGNESIUM
Magnesium: 2.4 mg/dL (ref 1.5–2.5)
Magnesium: 2.3 mg/dL (ref 1.5–2.5)

## 2011-06-23 LAB — CREATININE, SERUM
Creatinine, Ser: 1.02 mg/dL (ref 0.50–1.35)
GFR calc Af Amer: 85 mL/min — ABNORMAL LOW
GFR calc non Af Amer: 73 mL/min — ABNORMAL LOW

## 2011-06-24 ENCOUNTER — Inpatient Hospital Stay (HOSPITAL_COMMUNITY): Payer: Medicare Other

## 2011-06-24 DIAGNOSIS — I319 Disease of pericardium, unspecified: Secondary | ICD-10-CM

## 2011-06-24 LAB — POCT I-STAT 3, ART BLOOD GAS (G3+)
Acid-base deficit: 2 mmol/L (ref 0.0–2.0)
Bicarbonate: 21.9 mEq/L (ref 20.0–24.0)
O2 Saturation: 89 %
Patient temperature: 98.6
TCO2: 23 mmol/L (ref 0–100)
pCO2 arterial: 34.7 mmHg — ABNORMAL LOW (ref 35.0–45.0)
pH, Arterial: 7.408 (ref 7.350–7.450)
pO2, Arterial: 56 mmHg — ABNORMAL LOW (ref 80.0–100.0)

## 2011-06-24 LAB — CROSSMATCH
ABO/RH(D): A POS
Antibody Screen: NEGATIVE
Unit division: 0
Unit division: 0

## 2011-06-24 LAB — BASIC METABOLIC PANEL
BUN: 20 mg/dL (ref 6–23)
BUN: 23 mg/dL (ref 6–23)
CO2: 24 mEq/L (ref 19–32)
CO2: 25 mEq/L (ref 19–32)
Calcium: 8.3 mg/dL — ABNORMAL LOW (ref 8.4–10.5)
Calcium: 8.7 mg/dL (ref 8.4–10.5)
Chloride: 103 mEq/L (ref 96–112)
Chloride: 100 mEq/L (ref 96–112)
Creatinine, Ser: 1.4 mg/dL — ABNORMAL HIGH (ref 0.50–1.35)
Creatinine, Ser: 1.12 mg/dL (ref 0.50–1.35)
GFR calc Af Amer: 58 mL/min — ABNORMAL LOW (ref >90)
GFR calc Af Amer: 76 mL/min — ABNORMAL LOW (ref >90)
GFR calc non Af Amer: 50 mL/min — ABNORMAL LOW (ref >90)
GFR calc non Af Amer: 66 mL/min — ABNORMAL LOW (ref >90)
Glucose, Bld: 139 mg/dL — ABNORMAL HIGH (ref 70–99)
Glucose, Bld: 131 mg/dL — ABNORMAL HIGH (ref 70–99)
Potassium: 4.7 mEq/L (ref 3.5–5.1)
Potassium: 3.9 mEq/L (ref 3.5–5.1)
Sodium: 135 mEq/L (ref 135–145)
Sodium: 135 mEq/L (ref 135–145)

## 2011-06-24 LAB — GLUCOSE, CAPILLARY
Glucose-Capillary: 112 mg/dL — ABNORMAL HIGH (ref 70–99)
Glucose-Capillary: 114 mg/dL — ABNORMAL HIGH (ref 70–99)
Glucose-Capillary: 116 mg/dL — ABNORMAL HIGH (ref 70–99)
Glucose-Capillary: 119 mg/dL — ABNORMAL HIGH (ref 70–99)
Glucose-Capillary: 130 mg/dL — ABNORMAL HIGH (ref 70–99)
Glucose-Capillary: 132 mg/dL — ABNORMAL HIGH (ref 70–99)
Glucose-Capillary: 139 mg/dL — ABNORMAL HIGH (ref 70–99)

## 2011-06-24 LAB — BLOOD GAS, ARTERIAL
Acid-Base Excess: 1.6 mmol/L (ref 0.0–2.0)
Bicarbonate: 25.6 mEq/L — ABNORMAL HIGH (ref 20.0–24.0)
Drawn by: 206361
FIO2: 0.21 %
O2 Saturation: 95.8 %
Patient temperature: 98.6
TCO2: 26.8 mmol/L (ref 0–100)
pCO2 arterial: 39.5 mmHg (ref 35.0–45.0)
pH, Arterial: 7.427 (ref 7.350–7.450)
pO2, Arterial: 73.4 mmHg — ABNORMAL LOW (ref 80.0–100.0)

## 2011-06-24 LAB — CBC
HCT: 23.7 % — ABNORMAL LOW (ref 39.0–52.0)
HCT: 24 % — ABNORMAL LOW (ref 39.0–52.0)
Hemoglobin: 8.4 g/dL — ABNORMAL LOW (ref 13.0–17.0)
Hemoglobin: 8.6 g/dL — ABNORMAL LOW (ref 13.0–17.0)
MCH: 33.6 pg (ref 26.0–34.0)
MCH: 33.9 pg (ref 26.0–34.0)
MCHC: 35.4 g/dL (ref 30.0–36.0)
MCHC: 35.8 g/dL (ref 30.0–36.0)
MCV: 94.8 fL (ref 78.0–100.0)
MCV: 94.5 fL (ref 78.0–100.0)
Platelets: 110 10*3/uL — ABNORMAL LOW (ref 150–400)
Platelets: 133 10*3/uL — ABNORMAL LOW (ref 150–400)
RBC: 2.5 MIL/uL — ABNORMAL LOW (ref 4.22–5.81)
RBC: 2.54 MIL/uL — ABNORMAL LOW (ref 4.22–5.81)
RDW: 13.9 % (ref 11.5–15.5)
RDW: 13.4 % (ref 11.5–15.5)
WBC: 11.6 10*3/uL — ABNORMAL HIGH (ref 4.0–10.5)
WBC: 13.5 10*3/uL — ABNORMAL HIGH (ref 4.0–10.5)

## 2011-06-25 ENCOUNTER — Inpatient Hospital Stay (HOSPITAL_COMMUNITY): Payer: Medicare Other

## 2011-06-25 DIAGNOSIS — E1165 Type 2 diabetes mellitus with hyperglycemia: Secondary | ICD-10-CM

## 2011-06-25 DIAGNOSIS — IMO0001 Reserved for inherently not codable concepts without codable children: Secondary | ICD-10-CM

## 2011-06-25 LAB — GLUCOSE, CAPILLARY
Glucose-Capillary: 123 mg/dL — ABNORMAL HIGH (ref 70–99)
Glucose-Capillary: 122 mg/dL — ABNORMAL HIGH (ref 70–99)
Glucose-Capillary: 125 mg/dL — ABNORMAL HIGH (ref 70–99)
Glucose-Capillary: 142 mg/dL — ABNORMAL HIGH (ref 70–99)
Glucose-Capillary: 119 mg/dL — ABNORMAL HIGH (ref 70–99)

## 2011-06-25 LAB — BASIC METABOLIC PANEL
BUN: 21 mg/dL (ref 6–23)
CO2: 26 mEq/L (ref 19–32)
Calcium: 8.5 mg/dL (ref 8.4–10.5)
Chloride: 103 mEq/L (ref 96–112)
Creatinine, Ser: 0.94 mg/dL (ref 0.50–1.35)
GFR calc Af Amer: >90 mL/min (ref >90)
GFR calc non Af Amer: 84 mL/min — ABNORMAL LOW (ref >90)
Glucose, Bld: 112 mg/dL — ABNORMAL HIGH (ref 70–99)
Potassium: 3.9 mEq/L (ref 3.5–5.1)
Sodium: 136 mEq/L (ref 135–145)

## 2011-06-25 LAB — CBC
HCT: 21.4 % — ABNORMAL LOW (ref 39.0–52.0)
Hemoglobin: 7.7 g/dL — ABNORMAL LOW (ref 13.0–17.0)
MCH: 34.2 pg — ABNORMAL HIGH (ref 26.0–34.0)
MCHC: 36 g/dL (ref 30.0–36.0)
MCV: 95.1 fL (ref 78.0–100.0)
Platelets: 125 10*3/uL — ABNORMAL LOW (ref 150–400)
RBC: 2.25 MIL/uL — ABNORMAL LOW (ref 4.22–5.81)
RDW: 13.5 % (ref 11.5–15.5)
WBC: 10.1 10*3/uL (ref 4.0–10.5)

## 2011-06-26 ENCOUNTER — Inpatient Hospital Stay (HOSPITAL_COMMUNITY): Payer: Medicare Other

## 2011-06-26 LAB — GLUCOSE, CAPILLARY
Glucose-Capillary: 123 mg/dL — ABNORMAL HIGH (ref 70–99)
Glucose-Capillary: 106 mg/dL — ABNORMAL HIGH (ref 70–99)
Glucose-Capillary: 148 mg/dL — ABNORMAL HIGH (ref 70–99)
Glucose-Capillary: 106 mg/dL — ABNORMAL HIGH (ref 70–99)
Glucose-Capillary: 137 mg/dL — ABNORMAL HIGH (ref 70–99)
Glucose-Capillary: 113 mg/dL — ABNORMAL HIGH (ref 70–99)
Glucose-Capillary: 114 mg/dL — ABNORMAL HIGH (ref 70–99)

## 2011-06-26 LAB — CBC
HCT: 22.6 % — ABNORMAL LOW (ref 39.0–52.0)
Hemoglobin: 8 g/dL — ABNORMAL LOW (ref 13.0–17.0)
MCH: 33.9 pg (ref 26.0–34.0)
MCHC: 35.4 g/dL (ref 30.0–36.0)
MCV: 95.8 fL (ref 78.0–100.0)
Platelets: 160 10*3/uL (ref 150–400)
RBC: 2.36 MIL/uL — ABNORMAL LOW (ref 4.22–5.81)
RDW: 13.6 % (ref 11.5–15.5)
WBC: 8.7 10*3/uL (ref 4.0–10.5)

## 2011-06-26 LAB — COMPREHENSIVE METABOLIC PANEL
ALT: 167 U/L — ABNORMAL HIGH (ref 0–53)
AST: 109 U/L — ABNORMAL HIGH (ref 0–37)
Albumin: 2.9 g/dL — ABNORMAL LOW (ref 3.5–5.2)
Alkaline Phosphatase: 41 U/L (ref 39–117)
BUN: 17 mg/dL (ref 6–23)
CO2: 27 mEq/L (ref 19–32)
Calcium: 8.6 mg/dL (ref 8.4–10.5)
Chloride: 100 mEq/L (ref 96–112)
Creatinine, Ser: 0.83 mg/dL (ref 0.50–1.35)
GFR calc Af Amer: >90 mL/min (ref >90)
GFR calc non Af Amer: 88 mL/min — ABNORMAL LOW (ref >90)
Glucose, Bld: 102 mg/dL — ABNORMAL HIGH (ref 70–99)
Potassium: 4.1 mEq/L (ref 3.5–5.1)
Sodium: 136 mEq/L (ref 135–145)
Total Bilirubin: 1.2 mg/dL (ref 0.3–1.2)
Total Protein: 5.6 g/dL — ABNORMAL LOW (ref 6.0–8.3)

## 2011-06-26 LAB — CULTURE, RESPIRATORY W GRAM STAIN: Culture: NORMAL

## 2011-06-27 ENCOUNTER — Inpatient Hospital Stay (HOSPITAL_COMMUNITY): Payer: Medicare Other

## 2011-06-27 LAB — CBC
HCT: 21.9 % — ABNORMAL LOW (ref 39.0–52.0)
Hemoglobin: 7.6 g/dL — ABNORMAL LOW (ref 13.0–17.0)
MCH: 33.8 pg (ref 26.0–34.0)
MCHC: 34.7 g/dL (ref 30.0–36.0)
MCV: 97.3 fL (ref 78.0–100.0)
Platelets: 195 10*3/uL (ref 150–400)
RBC: 2.25 MIL/uL — ABNORMAL LOW (ref 4.22–5.81)
RDW: 13.9 % (ref 11.5–15.5)
WBC: 7 10*3/uL (ref 4.0–10.5)

## 2011-06-27 LAB — GLUCOSE, CAPILLARY
Glucose-Capillary: 106 mg/dL — ABNORMAL HIGH (ref 70–99)
Glucose-Capillary: 96 mg/dL (ref 70–99)

## 2011-06-27 LAB — BASIC METABOLIC PANEL
BUN: 17 mg/dL (ref 6–23)
CO2: 28 mEq/L (ref 19–32)
Calcium: 8.5 mg/dL (ref 8.4–10.5)
Chloride: 103 mEq/L (ref 96–112)
Creatinine, Ser: 0.83 mg/dL (ref 0.50–1.35)
GFR calc Af Amer: >90 mL/min (ref >90)
GFR calc non Af Amer: 88 mL/min — ABNORMAL LOW (ref >90)
Glucose, Bld: 95 mg/dL (ref 70–99)
Potassium: 3.2 mEq/L — ABNORMAL LOW (ref 3.5–5.1)
Sodium: 139 mEq/L (ref 135–145)

## 2011-06-28 LAB — CROSSMATCH
ABO/RH(D): A POS
Antibody Screen: NEGATIVE
Unit division: 0
Unit division: 0

## 2011-06-28 LAB — BASIC METABOLIC PANEL
BUN: 15 mg/dL (ref 6–23)
CO2: 25 mEq/L (ref 19–32)
Calcium: 8.5 mg/dL (ref 8.4–10.5)
Chloride: 102 mEq/L (ref 96–112)
Creatinine, Ser: 0.67 mg/dL (ref 0.50–1.35)
GFR calc Af Amer: >90 mL/min (ref >90)
GFR calc non Af Amer: >90 mL/min (ref >90)
Glucose, Bld: 98 mg/dL (ref 70–99)
Potassium: 3.9 mEq/L (ref 3.5–5.1)
Sodium: 138 mEq/L (ref 135–145)

## 2011-06-28 LAB — CBC
HCT: 21.8 % — ABNORMAL LOW (ref 39.0–52.0)
Hemoglobin: 7.6 g/dL — ABNORMAL LOW (ref 13.0–17.0)
MCH: 33.6 pg (ref 26.0–34.0)
MCHC: 34.9 g/dL (ref 30.0–36.0)
MCV: 96.5 fL (ref 78.0–100.0)
Platelets: 225 10*3/uL (ref 150–400)
RBC: 2.26 MIL/uL — ABNORMAL LOW (ref 4.22–5.81)
RDW: 13.7 % (ref 11.5–15.5)
WBC: 7.3 10*3/uL (ref 4.0–10.5)

## 2011-06-29 ENCOUNTER — Telehealth: Payer: Self-pay | Admitting: Internal Medicine

## 2011-06-29 NOTE — Telephone Encounter (Signed)
Pt had a surgery with dr Morton Peters and said he was to fu with [place] 2 weeks later, cxl appt tomorrow with lori gerhardt, who does he need to see and why?

## 2011-06-30 ENCOUNTER — Ambulatory Visit: Payer: Medicare Other | Admitting: Nurse Practitioner

## 2011-06-30 NOTE — Discharge Summary (Signed)
  NAME:  [name], [name] NO.:  [id number]  MEDICAL RECORD NO.:  [id number]  LOCATION:                                 FACILITY:  PHYSICIAN:  Sheliah Plane, MD    DATE OF BIRTH:  1940-12-28  DATE OF ADMISSION:  06/18/2011 DATE OF DISCHARGE:  06/28/2011                              DISCHARGE SUMMARY   ADDENDUM:  [name] has been seen and evaluated on the morning of June 28, 2011, and is deemed ready for discharge home at this time. Because his blood pressures have remained somewhat elevated, running systolics around 150, he has been restarted on his home dose of losartan 100 mg p.o. daily.  His labs have remained stable and one drawn on the date of discharge show sodium 138, potassium 3.9, BUN 15, creatinine 0.67.  Hemoglobin 7.6, hematocrit 21.8, platelets 225, white count 7.3.  Otherwise, he has remained stable and there have been no other changes since the previously dictated discharge summary.  Discharge medications are unchanged, except the addition of losartan 100 mg daily.  Discharge instructions and followups are unchanged from the previously dictated discharge summary.     [name], P.A.   ______________________________ Sheliah Plane, MD    GC/MEDQ  D:  06/28/2011  T:  06/29/2011  Job:  [id number]  cc:   Bevelyn Buckles. Bensimhon, MD Marjory Lies, M.D.  Electronically Signed by [name]. on 06/30/2011 10:41:30 AM Electronically Signed by Sheliah Plane MD on 06/30/2011 06:18:48 PM

## 2011-06-30 NOTE — Telephone Encounter (Signed)
Per Dr Gala Romney.  Pt to see Tereso Newcomer PAC in two weeks.  Appt was scheduled and pt was notified of date and time.

## 2011-07-05 NOTE — Discharge Summary (Signed)
[name], [name] NO.:  [id number]  MEDICAL RECORD NO.:  [id number]  LOCATION:  2015                         FACILITY:  [facility]  PHYSICIAN:  Kerin Perna, M.D.  DATE OF BIRTH:  1941/04/22  DATE OF ADMISSION:  06/18/2011 DATE OF DISCHARGE:  06/28/2011                              DISCHARGE SUMMARY   PRIMARY ADMITTING DIAGNOSIS:  Chest pain.  ADDITIONAL/DISCHARGE DIAGNOSES: 1. Severe multivessel coronary artery disease. 2. Class III unstable angina. 3. History of coronary artery disease status post stent placement. 4. Hypertension. 5. Dyslipidemia. 6. Postoperative acute blood loss anemia. 7. History of a right carotid bruit followed in the [facility]     office without significant stenosis.  PROCEDURES PERFORMED: 1. Cardiac catheterization. 2. Coronary artery bypass grafting x4 (left internal mammary artery to     the left anterior descending, saphenous vein graft to the circumflex marginal, saphenous vein graft to the posterior     descending, saphenous vein graft to the posterolateral). 3. Endoscopic vein harvest, bilateral lower extremities.  HISTORY:  The patient is a 70 year old male with a known history of coronary artery disease.  He has been followed since 1983 and approximately 1 year ago underwent a drug-eluting stent placement to the ostium of the right coronary artery with subsequent Plavix therapy. Over the past 2 weeks, he has had recurrent exertional chest pain, which occurs with exertion and is relieved by rest.  He was seen by [facility] and it was recommended that he undergo a repeat cardiac catheterization at this time.  This was performed on the date of admission, and the patient was found to have a high-grade in-stent stenosis of 99% at the proximal right coronary artery.  There was also some distal disease and significant stenosis at the takeoff of the posterior descending.  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  The ostium of the left main also appeared hazy.  The circumflex had significant disease in the OM1. Ejection fraction was 55%.  Based on the patient's recurrent symptoms and his coronary anatomy and the fact he had early restenoses after percutaneous intervention, it was felt that he should be considered for surgical revascularization.  He was subsequently admitted following his catheterization and started on IV nitroglycerin and heparin.  HOSPITAL COURSE:  [name] was admitted and was seen in consultation by Dr. Kathlee Nations Trigt.  His films were reviewed and Dr. Donata Clay agreed with the need for CABG.  He explained all risks, benefits, and alternatives of surgery to the patient and he agreed to proceed.  He remained stable and pain free prior to his surgery.  He was taken to the operating room on June 22, 2011, and underwent CABG x4 as described above.  Please see previously dictated operative report for complete details of surgery.  He tolerated the procedure well and was transferred to the SICU in stable condition.  He was extubated shortly after surgery.  He was hemodynamically stable and doing well on postop day 1. He did receive a transfusion of a unit of packed red blood cells for postoperative blood loss anemia.  He has some perioperative PACs and was started on amiodarone, however, he developed bradycardia while on the amiodarone and this was discontinued.  Since that time, his rhythm has remained stable.  Overall, his postoperative course has been uneventful. He has been transferred to the step-down unit and is progressing well with mobility.  His blood pressures have started to trend upward, and his beta-blocker dose has been titrated.  He has been started on Lasix for a volume overload and he is responding well although he does remain approximately 3 kg above his preoperative weight with some lower extremity edema on  physical exam.  He has continued to remain anemic but had been asymptomatic and has not required further transfusions.  He is currently being treated conservatively with p.o. iron supplementation. He has remained afebrile, and his vital signs have been stable.  He has been weaned from supplemental oxygen and is maintaining O2 sats of greater than 90% on room air.  He is ambulating in the halls without difficulty.  He is tolerating a regular diet.  LABORATORY DATA:  On postop day 5, show hemoglobin of 7.6, hematocrit 21.9, platelets 195, white count 7.0.  Sodium 139, potassium 3.2, which is currently being repleted, BUN 17, creatinine 0.83.  His latest chest x-ray shows stable bibasilar atelectasis.  We will repeat a CBC and a BMET on the morning of June 28, 2011.  It is anticipated that if he remained stable and no other acute changes have occurred, he will hopefully be ready for discharge on June 28, 2011.  DISCHARGE MEDICATIONS: 1. Enteric-coated aspirin 325 mg daily. 2. Folic acid 1 mg daily. 3. Lasix 40 mg daily x5 days. 4. Nu-Iron 150 mg daily. 5. Oxycodone IR 5 mg q.3 h. p.r.n. pain. 6. Potassium 20 mEq daily x5 days. 7. Fish oil 1000 mg 2 capsules daily. 8. Flomax 0.4 mg every other day alternated with 0.8 mg every other     day. 9. Glucosamine 1000 mg daily. 10.Lipitor 40 mg daily. 11.Metoprolol 25 mg b.i.d. 12.Niaspan 1000 mg daily. 13.Tylenol 500 mg 2 tabs q.6 h. p.r.n. pain. 14.Vitamin B 1 tab daily.  DISCHARGE INSTRUCTIONS:  He was asked to refrain from driving, heavy lifting, or strenuous activity.  He may continue ambulating daily and using his incentive spirometer.  He may shower daily and clean his incisions with soap and water.  He will continue a low-fat, low-sodium diet.  DISCHARGE FOLLOW:  He will see Dr. Gala Romney in the office in 2 weeks. He will then follow up with Dr. Donata Clay in 3 weeks with a chest x-ray. In the interim, if he experiences any  problems or has questions, he is asked to contact our office immediately.     Coral Ceo, P.A.   ______________________________ Kerin Perna, M.D.    GC/MEDQ  D:  06/27/2011  T:  06/28/2011  Job:  [id number]  cc:   Bevelyn Buckles. Bensimhon, MD Marjory Lies, M.D.  Electronically Signed by Coral Ceo P.A. on 06/30/2011 10:41:18 AM Electronically Signed by Kerin Perna M.D. on 07/05/2011 08:00:48 AM

## 2011-07-05 NOTE — Op Note (Signed)
NAMEMarland Kitchen  [name], [name] NO.:  [id number]  MEDICAL RECORD NO.:  [id number]  LOCATION:  2307                         FACILITY:  [facility]  PHYSICIAN:  Kerin Perna, M.D.  DATE OF BIRTH:  08/25/1941  DATE OF PROCEDURE:  06/22/2011 DATE OF DISCHARGE:                              OPERATIVE REPORT   OPERATIONS: 1. Coronary artery bypass grafting x4 (left internal mammary artery     LAD, saphenous vein graft to circumflex marginal, saphenous vein     graft to posterior descending branch of the distal right coronary,     saphenous vein graft to the posterolateral branch of the distal     right coronary artery). 2. Endoscopic harvest of bilateral leg greater saphenous vein.  PREOPERATIVE DIAGNOSES:  Severe multivessel coronary artery disease, unstable angina, previous percutaneous intervention with in-stent stenosis.  POSTOPERATIVE DIAGNOSES:  Severe multivessel coronary artery disease, unstable angina, previous percutaneous intervention with in-stent stenosis.  SURGEON:  Kerin Perna, MD  ASSISTANT:  Doree Fudge, PA-C  ANESTHESIA:  General by Sheldon Silvan, MD  INDICATIONS:  The patient is a 70 year old gentleman with almost a 30 year history of coronary artery disease and coronary interventions beginning with a PTCA in 1983.  He recently presented with unstable angina and a recent proximal RCA stent placed about a year ago.  He had a 99% stenosis.  There is diffuse disease of the right coronary dominant system with filling of the distal posterolateral branch of the distal posterior descending.  The LAD had a 70-80% stenosis in the circumflex marginal, had a 80% stenosis.  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  His overall LVEF is fairly well preserved by echo and he felt to be a candidate for surgical revascularization.  He had been on Plavix for a year and a Plavix  washout was completed before he was scheduled for elective surgical revascularization.  Prior to surgery, I discussed the operation of coronary artery bypass grafting with the patient and his wife.  I discussed the indications, benefits and risks.  I reviewed the major aspects of the operation including the use of general anesthesia, the location of the surgical incisions, the choice of conduit including internal mammary artery and endoscopically harvested saphenous vein, the use of general anesthesia and cardiopulmonary bypass, and the expected postoperative hospital recovery.  I discussed with the patient the risks to him of the operation including the risks of stroke, MI, bleeding, blood transfusion requirement, infection, and death.  After reviewing these issues, he demonstrated his understanding and agreed to proceed with surgery under what I felt was an informed consent.  OPERATIVE FINDINGS: 1. Severely calcified and diffusely diseased right coronary system     with suboptimal targets. 2. Small LAD. 3. Preserved LV global function. 4. No packed cell transfusion requirement for this operation.  PROCEDURE:  The patient was brought to the operating room and placed supine on the operating room table, where general anesthesia was induced.  The chest, abdomen and legs were prepped with Betadine and draped as a sterile field.  A sternal incision was made as  the saphenous vein was harvested endoscopically from both legs.  The left internal mammary artery was harvested as a pedicle graft from its origin at the subclavian vessels and was a good vessel excellent flow.  The sternal retractor was placed and the pericardium was opened and suspended. Pursestrings were placed in the ascending aorta and right atrium and after the vein had been harvested and inspected, the patient was given heparin and cannulated for bypass.  The ACT was documented as being therapeutic and cardiopulmonary  bypass was initiated.  The coronaries were identified for grafting.  The right coronary targets were severely calcified, diseased and a suboptimal graftable locations of the posterior descending which probed 1-mm vessel in the posterolateral which was a softer smaller vessel probing a 1-mm dilator.  The LAD was a small, but graftable vessel and the circumflex was a adequate target. Cardioplegic catheters were placed for both antegrade and retrograde cold blood cardioplegia and the patient was cooled to 32 degrees.  The aortic cross-clamp was applied and 800 mL of cold blood cardioplegia was delivered in split doses between the antegrade aortic and retrograde coronary sinus catheters.  There was good cardioplegic arrest and septal temperature dropped less than 12 degrees.  Cardioplegia was then delivered every 20 minutes or less while the crossclamp was in place.  The distal coronary anastomoses were then performed.  The first distal anastomosis was to the posterior descending branch of the right.  It was heavily calcified before the location of the anastomosis and distal as well.  It was a 1.4-mm vessel and a reverse saphenous vein was sewn end- to-side with running 7 Prolene with good flow through the graft.  The second distal anastomoses to the posterolateral branch of the distal right.  This was a small, but much softer vessel measuring 1.2-mm.  A reverse saphenous vein was sewn end-to-side with running 8-0 Prolene and there was good flow through the graft.  Cardioplegia was redosed.  The third distal anastomosis was to the circumflex marginal branch of the left coronary.  There was a 1.5-mm vessel and had a proximal 80% stenosis.  A reverse saphenous vein was sewn end-to-side with running 7- 0 Prolene.  There was excellent flow through the graft.  Cardioplegia was redosed.  The fourth distal anastomosis was to the distal third of the LAD.  More proximally, the LAD was intramyocardial.   It was a torturous 1.4-mm vessel and the left IMA pedicle was brought through an opening created in the left lateral pericardium.  It was brought down on the LAD and sewn end-to-side with running 8-0 Prolene.  There was excellent flow through the anastomosis after briefly releasing the pedicle bulldog on the mammary artery.  The bulldog was reapplied and the pedicle secured the epicardium with 6-0 Prolene.  Cardioplegia was redosed.  The crossclamp was still in place.  Three proximal vein anastomoses were performed on the ascending aorta using a 4.0-mm punch with running 7-0 Prolene.  Prior to tying down the final proximal anastomosis, the air was vented from the coronaries with a dose of retrograde warm blood cardioplegia.  The crossclamp was then removed.  The heart resumed a spontaneous rhythm.  Air was aspirated from the vein grafts and the cardioplegic catheters were removed.  The proximal distal anastomoses were checked and found to be hemostatic.  The patient was rewarmed to 37 degrees and temporary pacing wires were applied.  The lungs were expanded and the ventilator was resumed.  The patient was weaned  from bypass without difficulty with stable hemodynamics on renal dose of dopamine.  Protamine was administered without adverse reaction. The cannula was removed.  The mediastinum was irrigated.  There was diffuse oozing consistent with the patient's preoperative Plavix use. He was given unit of platelets and unit of FFP with improved coagulation function, but still somewhat coagulopathic.  However, it was felt adequate for closing and the superior pericardial fat was closed over the aorta.  Mediastinal and left pleural chest tube were placed and brought through separate incisions.  The sternum was closed with interrupted steel wire.  The pectoralis fascia was closed in a running 1 Vicryl.  The subcutaneous and skin layers were closed in running Vicryl and suture dressings were  applied.  Total cardiopulmonary bypass time was 150 minutes.     Kerin Perna, M.D.     PV/MEDQ  D:  06/22/2011  T:  06/23/2011  Job:  [id number]  cc:   Bevelyn Buckles. Bensimhon, MD Lore Polka M. [place], M.D.  Electronically Signed by Kerin Perna M.D. on 07/05/2011 08:00:37 AM

## 2011-07-09 ENCOUNTER — Encounter: Payer: Self-pay | Admitting: Cardiothoracic Surgery

## 2011-07-12 ENCOUNTER — Ambulatory Visit (INDEPENDENT_AMBULATORY_CARE_PROVIDER_SITE_OTHER): Payer: Medicare Other | Admitting: Physician Assistant

## 2011-07-12 ENCOUNTER — Encounter: Payer: Self-pay | Admitting: Physician Assistant

## 2011-07-12 VITALS — BP 154/70 | HR 80 | Ht 67.0 in | Wt 195.8 lb

## 2011-07-12 DIAGNOSIS — R609 Edema, unspecified: Secondary | ICD-10-CM | POA: Insufficient documentation

## 2011-07-12 DIAGNOSIS — Z01810 Encounter for preprocedural cardiovascular examination: Secondary | ICD-10-CM

## 2011-07-12 DIAGNOSIS — D649 Anemia, unspecified: Secondary | ICD-10-CM

## 2011-07-12 DIAGNOSIS — R079 Chest pain, unspecified: Secondary | ICD-10-CM

## 2011-07-12 DIAGNOSIS — I2510 Atherosclerotic heart disease of native coronary artery without angina pectoris: Secondary | ICD-10-CM

## 2011-07-12 DIAGNOSIS — R7989 Other specified abnormal findings of blood chemistry: Secondary | ICD-10-CM | POA: Insufficient documentation

## 2011-07-12 DIAGNOSIS — E785 Hyperlipidemia, unspecified: Secondary | ICD-10-CM

## 2011-07-12 LAB — CBC WITH DIFFERENTIAL/PLATELET
Basophils Absolute: 0 10*3/uL (ref 0.0–0.1)
Basophils Relative: 0.6 % (ref 0.0–3.0)
Eosinophils Absolute: 0 10*3/uL (ref 0.0–0.7)
Eosinophils Relative: 0 % (ref 0.0–5.0)
HCT: 27.6 % — ABNORMAL LOW (ref 39.0–52.0)
Hemoglobin: 9.3 g/dL — ABNORMAL LOW (ref 13.0–17.0)
Lymphocytes Relative: 17.1 % (ref 12.0–46.0)
Lymphs Abs: 1 10*3/uL (ref 0.7–4.0)
MCHC: 33.6 g/dL (ref 30.0–36.0)
MCV: 99.5 fl (ref 78.0–100.0)
Monocytes Absolute: 0.7 10*3/uL (ref 0.1–1.0)
Monocytes Relative: 10.8 % (ref 3.0–12.0)
Neutro Abs: 4.3 10*3/uL (ref 1.4–7.7)
Neutrophils Relative %: 71.5 % (ref 43.0–77.0)
Platelets: 449 10*3/uL — ABNORMAL HIGH (ref 150.0–400.0)
RBC: 2.77 Mil/uL — ABNORMAL LOW (ref 4.22–5.81)
RDW: 14.6 % (ref 11.5–14.6)
WBC: 6.1 10*3/uL (ref 4.5–10.5)

## 2011-07-12 LAB — HEPATIC FUNCTION PANEL
ALT: 32 U/L (ref 0–53)
AST: 26 U/L (ref 0–37)
Albumin: 3.5 g/dL (ref 3.5–5.2)
Alkaline Phosphatase: 54 U/L (ref 39–117)
Bilirubin, Direct: 0.1 mg/dL (ref 0.0–0.3)
Total Bilirubin: 0.7 mg/dL (ref 0.3–1.2)
Total Protein: 6.5 g/dL (ref 6.0–8.3)

## 2011-07-12 LAB — BASIC METABOLIC PANEL
BUN: 6 mg/dL (ref 6–23)
CO2: 26 mEq/L (ref 19–32)
Calcium: 9.1 mg/dL (ref 8.4–10.5)
Chloride: 103 mEq/L (ref 96–112)
Creatinine, Ser: 0.7 mg/dL (ref 0.4–1.5)
GFR: 115.22 mL/min (ref >60.00)
Glucose, Bld: 102 mg/dL — ABNORMAL HIGH (ref 70–99)
Potassium: 3.8 mEq/L (ref 3.5–5.1)
Sodium: 137 mEq/L (ref 135–145)

## 2011-07-12 MED ORDER — ASPIRIN 325 MG PO TBEC: 325.0000 mg | DELAYED_RELEASE_TABLET | Freq: Every day | ORAL | Status: DC

## 2011-07-12 MED ORDER — METOPROLOL TARTRATE 50 MG PO TABS: 50.0000 mg | ORAL_TABLET | Freq: Two times a day (BID) | ORAL | Status: DC

## 2011-07-12 MED ORDER — POTASSIUM CHLORIDE CRYS ER 20 MEQ PO TBCR: 20.0000 meq | EXTENDED_RELEASE_TABLET | Freq: Two times a day (BID) | ORAL | Status: DC

## 2011-07-12 MED ORDER — METOPROLOL TARTRATE 25 MG PO TABS: 50.0000 mg | ORAL_TABLET | Freq: Two times a day (BID) | ORAL | Status: DC

## 2011-07-12 MED ORDER — ATORVASTATIN CALCIUM 40 MG PO TABS: 40.0000 mg | ORAL_TABLET | Freq: Every day | ORAL | Status: DC

## 2011-07-12 MED ORDER — FUROSEMIDE 40 MG PO TABS: 40.0000 mg | ORAL_TABLET | Freq: Every day | ORAL | Status: DC

## 2011-07-12 MED ORDER — POTASSIUM CHLORIDE CRYS ER 20 MEQ PO TBCR: 20.0000 meq | EXTENDED_RELEASE_TABLET | Freq: Every day | ORAL | Status: DC

## 2011-07-12 NOTE — Assessment & Plan Note (Signed)
LDL optimal in 2/12.  Repeat LFTs today.

## 2011-07-12 NOTE — Patient Instructions (Addendum)
Your physician recommends that you schedule a follow-up appointment in: Dec with Dr Gala Romney Your physician recommends that you return for lab work in: today (hepatic, BMP, CBC) Your physician has recommended you make the following change in your medication: TAKE Furosemide 40 mg daily and Potassium 20 mEq daily for 5 days after finishing these INCREASE Metoprolol to 50 mg twice daily START Aspirin 325 mg daily You have been referred to Cardiac Rehab

## 2011-07-12 NOTE — Progress Notes (Signed)
History of Present Illness: Primary Cardiologist:  Dr. Arvilla Meres   [name] is a 70 y.o. male who presents for post hospital follow up.    He was previously followed by Dr. Graceann Congress and now he sees Dr. Gala Romney.  He has a history of CAD and has had multiple angioplasties to the RCA dating back to 48.  Most recent PCI was a DES to the RCA in 11/11. Other h/o includes HTN and HLP.  [facility].  He also has carotid stenosis and last carotid u/s 2/12: 40-59% bilaterally. (stable)   He was seen in the office 10/3 by L. Gerdhardt, NP for exertional chest pain.  LHC arranged on 10/5 demonstrated 2 vessel CAD with severe ISR in the RCA and CABG was recommended.  EF was 55-60%.  CABG 10/9 with Dr. Donata Clay: L-LAD, S-OM, S-PDA.  Post op anemia required txn with PRBCs x 1 and hgb remained stable thereafter. He was tx with amiodarone for PACs, but developed bradycardia and it was stopped.  He otherwise remained in NSR.  He was placed on lasix for volume overload.    CXR 06/27/11: right pleural effusion. Latest labs: Hgb 7.6, K 3.9, creatinine 0.67, ALT 167, TSH 2.571  He is doing ok.  SVG harvesting sites in both legs are sore.  It feels better with walking.  He walks 1/2 mile twice a day.  Chest sore.  No further angina.  No orthopnea, PND. RLE edema > LLE edema.  Edema is improving.  No fevers or chills.  Had some diarrhea one day.  Interested in cardiac rehab.  Of note, previously on amlodipine, spironolactone.  These were stopped in the hospital.   Past Medical History  Diagnosis Date  . CAD (coronary artery disease)     s/p multiple PCIs to RCA dating back to 69. Last stent to proximal RCA in November of 2011. Also had 99% stenosis in the distal PDA with left to right collaterals and nonobstructive disease in the left coronary tree. ;  ISR of RCA 10/12 - CABG 10/9 with Dr. Zenaida Niece Trigt: L-LAD, S-OM, S-PDA  . HTN (hypertension)   . Hyperlipidemia     . 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 (stable)    Current Outpatient Prescriptions  Medication Sig Dispense Refill  . acetaminophen (TYLENOL) 500 MG tablet Take 1,000 mg by mouth daily as needed.        Marland Kitchen atorvastatin (LIPITOR) 40 MG tablet Take 40 mg by mouth daily.        . folic acid (FOLVITE) 1 MG tablet Take 1 mg by mouth daily.        . Glucosamine HCl 1000 MG TABS Take 1 tablet by mouth daily.        . IRON PO Take by mouth.        . losartan (COZAAR) 100 MG tablet Take 1 tablet (100 mg total) by mouth daily.  30 tablet  6  . metoprolol tartrate (LOPRESSOR) 25 MG tablet Take 1 tablet (25 mg total) by mouth 2 (two) times daily.  60 tablet  11  . niacin (NIASPAN) 1000 MG CR tablet Take 1 tablet (1,000 mg total) by mouth at bedtime.  30 tablet  12  . Omega-3 Fatty Acids (FISH OIL DOUBLE STRENGTH) 1200 MG CAPS Take 2 capsules by mouth daily.        Marland Kitchen oxycodone (OXY-IR) 5 MG capsule Take 5 mg  by mouth every 4 (four) hours as needed.        . Tamsulosin HCl (FLOMAX) 0.4 MG CAPS Take 0.4 mg by mouth daily. Alternating 1-2 tablets daily       . VITAMIN B COMPLEX-C PO Take by mouth daily.          Allergies: Allergies  Allergen Reactions  . Morphine     History  Substance Use Topics  . Smoking status: Never Smoker   . Smokeless tobacco: Not on file  . Alcohol Use: 0.0 oz/week     occasionally     ROS:  Please see the history of present illness.  All other systems reviewed and negative.   Vital Signs: BP 154/70  Pulse 80  Ht 5\' 7"  (1.702 m)  Wt 195 lb 12.8 oz (88.814 kg)  BMI 30.67 kg/m2  PHYSICAL EXAM: Well nourished, well developed, in no acute distress HEENT: normal Neck: no JVD Cardiac:  normal S1, S2; RRR; no murmur Chest: median sternotomy wound well healed, no erythema or discharge Lungs:  clear to auscultation bilaterally, no wheezing, rhonchi or rales Abd: soft, nontender, no hepatomegaly Ext: RLE 2+ edema; LLE tr-1+ edema; SVG harvesting  sites well healed without erythema or discharge, ecchymoses noted Skin: warm and dry Neuro:  CNs 2-12 intact, no focal abnormalities noted Psych: normal affect  EKG:  NSR, HR 80, rightward axis, PRWP, NSSTTW changes  ASSESSMENT AND PLAN:

## 2011-07-12 NOTE — Assessment & Plan Note (Signed)
Progressing well post CABG. He sees Dr. Donata Clay next week.  He has some pain around his SVG harvesting sites.  However, this is improving and they appear to be stable.  He can use tylenol and heat PRN.  Continue ASA (he inadvertently stopped it).  Refer to cardiac rehab.  Follow up with Dr. Gala Romney in 08/25/11 as previously scheduled.

## 2011-07-12 NOTE — Assessment & Plan Note (Signed)
Volume overload post op is improved.  However, his legs are still swollen and painful.  Take Lasix 40 mg QD and K+ 20 mEq QD for 5 more days.

## 2011-07-12 NOTE — Assessment & Plan Note (Signed)
Follow up dopplers due in 2/13.

## 2011-07-12 NOTE — Assessment & Plan Note (Signed)
Somewhat elevated.  HR could be better too.  Increase metoprolol to 50 mg BID.  Check BMET today.

## 2011-07-12 NOTE — Assessment & Plan Note (Signed)
Peri-operative.  ? Hepatic congestion.  Repeat LFTs today.

## 2011-07-12 NOTE — Assessment & Plan Note (Signed)
-   Follow-up CBC today

## 2011-07-14 ENCOUNTER — Other Ambulatory Visit: Payer: Self-pay | Admitting: Cardiothoracic Surgery

## 2011-07-14 DIAGNOSIS — I2510 Atherosclerotic heart disease of native coronary artery without angina pectoris: Secondary | ICD-10-CM

## 2011-07-19 ENCOUNTER — Ambulatory Visit
Admission: RE | Admit: 2011-07-19 | Discharge: 2011-07-19 | Disposition: A | Discharge status: Home / Self Care | Payer: Medicare Other | Source: Ambulatory Visit | Attending: Cardiothoracic Surgery | Admitting: Cardiothoracic Surgery

## 2011-07-19 ENCOUNTER — Ambulatory Visit (INDEPENDENT_AMBULATORY_CARE_PROVIDER_SITE_OTHER): Payer: Self-pay | Admitting: Surgical

## 2011-07-19 VITALS — BP 109/60 | HR 73 | Resp 16 | Ht 67.0 in | Wt 190.0 lb

## 2011-07-19 DIAGNOSIS — I2510 Atherosclerotic heart disease of native coronary artery without angina pectoris: Secondary | ICD-10-CM

## 2011-07-19 DIAGNOSIS — Z951 Presence of aortocoronary bypass graft: Secondary | ICD-10-CM

## 2011-07-19 NOTE — Patient Instructions (Signed)
Coronary Artery Bypass Grafting Care After Refer to this sheet in the next few weeks. These instructions provide you with information on caring for yourself after your procedure. Your caregiver may also give you more specific instructions. Your treatment has been planned according to current medical practices, but problems sometimes occur. Call your caregiver if you have any problems or questions after your procedure.  Recovery from open heart surgery will be different for everyone. Some people feel well after 3 or 4 weeks, while for others it takes longer. After heart surgery, it may be normal to:  Not have an appetite, feel nauseated by the smell of food, or only want to eat a small amount.   Be constipated because of changes in your diet, activity, and medicines. Eat foods high in fiber. Add fresh fruits and vegetables to your diet. Stool softeners may be helpful.   Feel sad or unhappy. You may be frustrated or cranky. You may have good days and bad days. Do not give up. Talk to your caregiver if you do not feel better.   Feel weakness and fatigue. You many need physical therapy or cardiac rehabilitation to get your strength back.   Develop an irregular heartbeat called atrial fibrillation. Symptoms of atrial fibrillation are a fast, irregular heartbeat or feelings of fluttery heartbeats, shortness of breath, low blood pressure, and dizziness. If these symptoms develop, see your caregiver right away.  MEDICATION  Have a list of all the medicines you will be taking when you leave the hospital. For every medicine, know the following:   Name.   Exact dose.   Time of day to be taken.   How often it should be taken.   Why you are taking it.   Ask which medicines should or should not be taken together. If you take more than one heart medicine, ask if it is okay to take them together. Some heart medicines should not be taken at the same time because they may lower your blood pressure too  much.   Narcotic pain medicine can cause constipation. Eat fresh fruits and vegetables. Add fiber to your diet. Stool softener medicine may help relieve constipation.   Keep a copy of your medicines with you at all times.   Do not add or stop taking any medicine until you check with your caregiver.   Medicines can have side effects. Call your caregiver who prescribed the medicine if you:   Start throwing up, have diarrhea, or have stomach pain.   Feel dizzy or lightheaded when you stand up.   Feel your heart is skipping beats or is beating too fast or too slow.   Develop a rash.   Notice unusual bruising or bleeding.  HOME CARE INSTRUCTIONS  After heart surgery, it is important to learn how to take your pulse. Have your caregiver show you how to take your pulse.   Use your incentive spirometer. Ask your caregiver how long after surgery you need to use it.  Care of your chest incision  Tell your caregiver right away if you notice clicking in your chest (sternum).   Support your chest with a pillow or your arms when you take deep breaths and cough.   Follow your caregiver's instructions about when you can bathe or swim.   Protect your incision from sunlight during the first year to keep the scar from getting dark.   Tell your caregiver if you notice:   Increased tenderness of your incision.   Increased redness   or swelling around your incision.   Drainage or pus from your incision.  Care of your leg incision(s)  Avoid crossing your legs.   Avoid sitting for long periods of time. Change positions every half hour.   Elevate your leg(s) when you are sitting.   Check your leg(s) daily for swelling. Check the incisions for redness or drainage.   Wear your elastic stockings as told by your caregiver. Take them off at bedtime.  Diet  Diet is very important to heart health.   Eat plenty of fresh fruits and vegetables. Meats should be lean cut. Avoid canned, processed, and  fried foods.   Talk to a dietician. They can teach you how to make healthy food and drink choices.  Weight  Weigh yourself every day. This is important because it helps to know if you are retaining fluid that may make your heart and lungs work harder.   Use the same scale each time.   Weigh yourself every morning at the same time. You should do this after you go to the bathroom, but before you eat breakfast.   Your weight will be more accurate if you do not wear any clothes.   Record your weight.   Tell your caregiver if you have gained 2 pounds or more overnight.  Activity Stop any activity at once if you have chest pain, shortness of breath, irregular heartbeats, or dizziness. Get help right away if you have any of these symptoms.  Bathing.  Avoid soaking in a bath or hot tub until your incisions are healed.   Rest. You need a balance of rest and activity.   Exercise. Exercise per your caregiver's advice. You may need physical therapy or cardiac rehabilitation to help strengthen your muscles and build your endurance.   Climbing stairs. Unless your caregiver tells you not to climb stairs, go up stairs slowly and rest if you tire. Do not pull yourself up by the handrail.   Driving a car. Follow your caregiver's advice on when you may drive. You may ride as a passenger at any time. When traveling for long periods of time in a car, get out of the car and walk around for a few minutes every 2 hours.   Lifting. Avoid lifting, pushing, or pulling anything heavier than 10 pounds for 6 weeks after surgery or as told by your caregiver.   Returning to work. Check with your caregiver. People heal at different rates. Most people will be able to go back to work 6 to 12 weeks after surgery.   Sexual activity. You may resume sexual relations as told by your caregiver.  SEEK MEDICAL CARE IF:  Any of your incisions are red, painful, or have any type of drainage coming from them.   You have an  oral temperature above 102 F (38.9 C).   You have ankle or leg swelling.   You have pain in your legs.   You have weight gain of 2 or more pounds a day.   You feel dizzy or lightheaded when you stand up.  SEEK IMMEDIATE MEDICAL CARE IF:  You have angina or chest pain that goes to your jaw or arms. Call your local emergency services right away.   You have shortness of breath at rest or with activity.   You have a fast or irregular heartbeat (arrhythmia).   There is a "clicking" in your sternum when you move.   You have numbness or weakness in your arms or legs.    MAKE SURE YOU:  Understand these instructions.   Will watch your condition.   Will get help right away if you are not doing well or get worse.  Document Released: 03/19/2005 Document Revised: 05/12/2011 Document Reviewed: 11/04/2010 [employer] Patient Information 2012 [employer]. 

## 2011-07-19 NOTE — Progress Notes (Signed)
  HPI:  Patient returns for routine postoperative follow-up having undergone coronary artery bypass grafting x4 on 06/22/2011. This was done for severe multivessel coronary artery disease with unstable angina and previous percutaneous intervention with in-stent stenosis. The patient's early postoperative recovery while in the hospital was notable for some difficulties with hypertension but otherwise was quite unremarkable. Since hospital discharge the patient reports he is doing quite well. There is no difficulty with shortness of breath. He is having only minor chest discomfort related to his sternotomy. He denies fevers chills or other constitutional symptoms.   Current Outpatient Prescriptions  Medication Sig Dispense Refill  . acetaminophen (TYLENOL) 500 MG tablet Take 1,000 mg by mouth daily as needed.        Marland Kitchen aspirin 325 MG EC tablet Take 1 tablet (325 mg total) by mouth daily.      Marland Kitchen atorvastatin (LIPITOR) 40 MG tablet Take 1 tablet (40 mg total) by mouth daily.  30 tablet  11  . folic acid (FOLVITE) 1 MG tablet Take 1 mg by mouth daily.        . Glucosamine HCl 1000 MG TABS Take 1 tablet by mouth daily.        . IRON PO Take by mouth.        . losartan (COZAAR) 100 MG tablet Take 1 tablet (100 mg total) by mouth daily.  30 tablet  6  . metoprolol tartrate (LOPRESSOR) 50 MG tablet Take 1 tablet (50 mg total) by mouth 2 (two) times daily.  60 tablet  11  . niacin (NIASPAN) 1000 MG CR tablet Take 1 tablet (1,000 mg total) by mouth at bedtime.  30 tablet  12  . Omega-3 Fatty Acids (FISH OIL DOUBLE STRENGTH) 1200 MG CAPS Take 2 capsules by mouth daily.        . Tamsulosin HCl (FLOMAX) 0.4 MG CAPS Take 0.4 mg by mouth daily. Alternating 1-2 tablets daily         Physical Exam Gen.: Well-developed adult male in no acute distress. Pulmonary: Clear lung fields bilaterally. Cardiac: Normal S1-S2 regular rate and rhythm. Extremities: Trace edema Incisions: Healing well without evidence of  infection. Diagnostic Tests: A chest x-ray was obtained on today's date. It reveals clear lung fields bilaterally. There are no new or acute findings.   Impression: The patient is doing quite well. I have instructed him on lifting restrictions and driving protocol.  Plan: We will see the patient again on a when necessary basis for any surgically related issues or at the cardiology request .

## 2011-07-21 ENCOUNTER — Ambulatory Visit: Payer: Medicare Other | Admitting: Cardiothoracic Surgery

## 2011-07-22 ENCOUNTER — Telehealth: Payer: Self-pay | Admitting: Internal Medicine

## 2011-07-22 NOTE — Telephone Encounter (Signed)
Pt calling regarding cardiac rehab. Please return pt call to discuss further.

## 2011-07-22 NOTE — Telephone Encounter (Signed)
Pt states he has not been called by cardiac rehab regarding scheduling rehab yet.  I called cardiac rehab and they said they will call him.  Pt was notified of this.

## 2011-07-27 NOTE — Telephone Encounter (Signed)
Pt returning your call

## 2011-07-28 NOTE — Telephone Encounter (Signed)
Cardiac rehab was called and they have [name] on the list to be called today.  [name] was notified of this.

## 2011-08-12 NOTE — Progress Notes (Signed)
[name] 70 y.o. male       Nutrition Screen                                                                    YES  NO Do you live in a nursing home?  X   Do you eat out more than 3 times/week?    X If yes, how many times per week do you eat out?  Do you have food allergies?  X  If yes, what are you allergic to? Apples  Have you gained or lost more than 10 lbs without trying?               X If yes, how much weight have you lost and over what time period?  lbs gained or lost over  weeks/month  Do you want to lose weight?    X  If yes, what is a goal weight or amount of weight you would like to lose? 15 lb  Do you eat alone most of the time?   X   Do you eat less than 2 meals/day?  X If yes, how many meals do you eat?  Do you drink more than 3 alcohol drinks/day?  X If yes, how many drinks per day?  Are you having trouble with constipation? *  X If yes, what are you doing to help relieve constipation?  Do you have financial difficulties with buying food?*    X   Are you experiencing regular nausea/ vomiting?*     X   Do you have a poor appetite? *                                        X   Do you have trouble chewing/swallowing? *   X    Pt with diagnoses of:  X CABG              X Stent/ PTCA      X Dyslipidemia  / HDL< 40 / LDL>70 / High TG      X %  Body fat >goal / Body Mass Index >25 X HTN / BP >120/80       Pt Risk Score    2       Diagnosis Risk Score  25       Total Risk Score   27                         High Risk               X Low Risk              HT: 67.25" Ht Readings from Last 1 Encounters:  07/19/11 5\' 7"  (1.702 m)    WT: 186.8 lb (84.9 kg) Wt Readings from Last 3 Encounters:  07/19/11 190 lb (86.183 kg)  07/12/11 195 lb 12.8 oz (88.814 kg)  06/16/11 196 lb (88.905 kg)     IBW 68 125%IBW BMI 29.1 30.5%body fat  Meds:  Past Medical History  Diagnosis Date  . CAD (coronary artery disease)  s/p multiple PCIs to RCA dating back to 4. Last  stent to proximal RCA in November of 2011. Also had 99% stenosis in the distal PDA with left to right collaterals and nonobstructive disease in the left coronary tree. ;  ISR of RCA 10/12 - CABG 10/9 with Dr. Zenaida Niece Trigt: L-LAD, S-OM, S-PDA  . HTN (hypertension)   . Hyperlipidemia   . 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 (stable)        Activity level: Pt is moderately active  Wt goal: 162-174 lb ( 73.6-79.1 kg) Current tobacco use? No      Food/Drug Interaction? No Labs:  Lipid Panel     Component Value Date/Time   CHOL 111 10/26/2010 0822   TRIG 37.0 10/26/2010 0822   HDL 38.10* 10/26/2010 0822   CHOLHDL 3 10/26/2010 0822   VLDL 7.4 10/26/2010 0822   LDLCALC 66 10/26/2010 0822     Lab Results  Component Value Date   HGBA1C 5.6 06/21/2011    06/28/11 Glucose 98   LDL goal:    < 70      MI, DM, Carotid or PVD and > 2:      tobacco, HTN, HDL, family h/o, lipoprotein a     > 68 yo male or        >68 yo male   Estimated Daily Nutrition Needs for: ? wt loss 1400-1900 Kcal , Total Fat 35-50gm, Saturated Fat 10-14 gm, Trans Fat 1.3-1.9 gm,  Sodium less than 1500 mg

## 2011-08-18 ENCOUNTER — Encounter (HOSPITAL_COMMUNITY)
Admission: RE | Admit: 2011-08-18 | Discharge: 2011-08-18 | Disposition: A | Discharge status: Home / Self Care | Payer: Medicare Other | Source: Ambulatory Visit | Attending: Internal Medicine | Admitting: Internal Medicine

## 2011-08-18 DIAGNOSIS — E785 Hyperlipidemia, unspecified: Secondary | ICD-10-CM | POA: Insufficient documentation

## 2011-08-18 DIAGNOSIS — Z951 Presence of aortocoronary bypass graft: Secondary | ICD-10-CM | POA: Insufficient documentation

## 2011-08-18 DIAGNOSIS — I2510 Atherosclerotic heart disease of native coronary artery without angina pectoris: Secondary | ICD-10-CM | POA: Insufficient documentation

## 2011-08-18 DIAGNOSIS — E876 Hypokalemia: Secondary | ICD-10-CM | POA: Insufficient documentation

## 2011-08-18 DIAGNOSIS — I200 Unstable angina: Secondary | ICD-10-CM | POA: Insufficient documentation

## 2011-08-18 DIAGNOSIS — Z96649 Presence of unspecified artificial hip joint: Secondary | ICD-10-CM | POA: Insufficient documentation

## 2011-08-18 DIAGNOSIS — Z5189 Encounter for other specified aftercare: Secondary | ICD-10-CM | POA: Insufficient documentation

## 2011-08-18 NOTE — Progress Notes (Signed)
Pt started cardiac rehab today.  Pt tolerated light exercise without difficulty. Telemetry rhythm Sinus with rare PVC ,couplet times one. Patient asymptomatic.  Will forward ECG tracing for Dr Gala Romney  To review

## 2011-08-20 ENCOUNTER — Encounter (HOSPITAL_COMMUNITY)
Admission: RE | Admit: 2011-08-20 | Discharge: 2011-08-20 | Disposition: A | Discharge status: Home / Self Care | Payer: Medicare Other | Source: Ambulatory Visit | Attending: Pulmonary Disease | Admitting: Pulmonary Disease

## 2011-08-23 ENCOUNTER — Encounter (HOSPITAL_COMMUNITY)
Admission: RE | Admit: 2011-08-23 | Discharge: 2011-08-23 | Disposition: A | Discharge status: Home / Self Care | Payer: Medicare Other | Source: Ambulatory Visit | Attending: Pulmonary Disease | Admitting: Pulmonary Disease

## 2011-08-25 ENCOUNTER — Encounter: Payer: Self-pay | Admitting: Internal Medicine

## 2011-08-25 ENCOUNTER — Encounter (HOSPITAL_COMMUNITY): Payer: Medicare Other

## 2011-08-25 ENCOUNTER — Ambulatory Visit (INDEPENDENT_AMBULATORY_CARE_PROVIDER_SITE_OTHER): Payer: Medicare Other | Admitting: Internal Medicine

## 2011-08-25 VITALS — BP 170/80 | HR 66

## 2011-08-25 DIAGNOSIS — I200 Unstable angina: Secondary | ICD-10-CM

## 2011-08-25 DIAGNOSIS — I10 Essential (primary) hypertension: Secondary | ICD-10-CM

## 2011-08-25 DIAGNOSIS — I2510 Atherosclerotic heart disease of native coronary artery without angina pectoris: Secondary | ICD-10-CM

## 2011-08-25 DIAGNOSIS — E785 Hyperlipidemia, unspecified: Secondary | ICD-10-CM

## 2011-08-25 DIAGNOSIS — I6529 Occlusion and stenosis of unspecified carotid artery: Secondary | ICD-10-CM

## 2011-08-25 NOTE — Assessment & Plan Note (Signed)
BP elevated here but well controlled at cardiac rehab. Suspect white-coat component. He will continue to follow BP at [facility].

## 2011-08-25 NOTE — Patient Instructions (Signed)
Your physician wants you to follow-up in: 6 months in the Heart Failure Clinic at [facility].   You will receive a reminder letter in the mail two months in advance. If you don't receive a letter, please call our office to schedule the follow-up appointment.  Your physician recommends that you return for lab work in: fasting tomorrow at the [place] office (CMET, Lipid)

## 2011-08-25 NOTE — Assessment & Plan Note (Signed)
Doing well s/p CABG. Continue cardiac rehab.

## 2011-08-25 NOTE — Progress Notes (Signed)
HPI:  [name] is a very pleasant 70 year old male previously followed by Dr. Graceann Congress.  He has a history of coronary artery disease.  He has multiple angioplasties to the RCA dating back to 17.  He also has a history of hypertension and hyperlipidemia and is followed by the Lipid Clinic.  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  He was hospitalized iun 11/11 because of recurrent chest pain. He underwent drug-eluting stent to the proximal RCA July 24, 2010. Ejection fraction was 65%. He also had 99% stenosis in the distal PDA with left to right collaterals and nonobstructive disease in the left coronary tree.  Admitted in October 2012 with [place]. Found to have progressive CAD. Underwent Coronary artery bypass grafting x4 (left internal mammary artery to LAD, saphenous vein graft to circumflex marginal, saphenous vein graft to posterior descending branch of the distal right coronary, saphenous vein graft to the posterolateral branch of the distal right coronary artery).   Doing well. Starting to go to cardiac rehab. No angina/SOB. BP at cardiac rehab running in 120s. Some numbness in R leg.   Carotid u/s 2/12: 40-59% bilat (stable)   ECG SR 79 No ST-T wave abnormalities.  ROS: All systems negative except as listed in HPI, PMH and Problem List.  Past Medical History  Diagnosis Date  . CAD (coronary artery disease)     s/p multiple PCIs to RCA dating back to 43. Last stent to proximal RCA in November of 2011. Also had 99% stenosis in the distal PDA with left to right collaterals and nonobstructive disease in the left coronary tree. ;  ISR of RCA 10/12 - CABG 10/9 with Dr. Zenaida Niece Trigt: L-LAD, S-OM, S-PDA  . HTN (hypertension)   . Hyperlipidemia   . 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 (stable)    Current Outpatient Prescriptions  Medication Sig Dispense Refill  . acetaminophen (TYLENOL) 500 MG tablet Take  1,000 mg by mouth daily as needed.        Marland Kitchen amoxicillin (AMOXIL) 500 MG capsule Take 500 mg by mouth 3 (three) times daily. PRIOR TO DENTAL APPOINTMENT       . aspirin 325 MG EC tablet Take 1 tablet (325 mg total) by mouth daily.      Marland Kitchen atorvastatin (LIPITOR) 40 MG tablet Take 1 tablet (40 mg total) by mouth daily.  30 tablet  11  . B Complex Vitamins (VITAMIN-B COMPLEX PO) Take by mouth. 1 tab daily       . folic acid (FOLVITE) 1 MG tablet Take 1 mg by mouth daily.        . Glucosamine HCl 1000 MG TABS Take 1 tablet by mouth daily.        Marland Kitchen losartan (COZAAR) 100 MG tablet Take 1 tablet (100 mg total) by mouth daily.  30 tablet  6  . metoprolol tartrate (LOPRESSOR) 50 MG tablet Take 1 tablet (50 mg total) by mouth 2 (two) times daily.  60 tablet  11  . niacin (NIASPAN) 1000 MG CR tablet Take 1 tablet (1,000 mg total) by mouth at bedtime.  30 tablet  12  . Omega-3 Fatty Acids (FISH OIL DOUBLE STRENGTH) 1200 MG CAPS Take 2 capsules by mouth daily.        . Tamsulosin HCl (FLOMAX) 0.4 MG CAPS Take 0.4 mg by mouth daily. Alternating 1-2 tablets daily  PHYSICAL EXAM: Filed Vitals:   08/25/11 0920  BP: 170/80  Pulse: 66   General:  Well appearing. No resp difficulty HEENT: normal Neck: supple. JVP flat. Carotids 2+ bilaterally; no bruits. No lymphadenopathy or thryomegaly appreciated. Cor: sternotomy stable  PMI normal. Regular rate & rhythm. No rubs, gallops or murmurs. Lungs: clear Abdomen: soft, nontender, nondistended. No hepatosplenomegaly. No bruits or masses. Good bowel sounds. Extremities: no cyanosis, clubbing, rash, tr edema on R Neuro: alert & orientedx3, cranial nerves grossly intact. Moves all 4 extremities w/o difficulty. Affect pleasant.   ECG: NSR 66 No ST-T wave abnormalities.     ASSESSMENT & PLAN:

## 2011-08-25 NOTE — Assessment & Plan Note (Signed)
Moderate. Asymptomatic. Will check again next year.

## 2011-08-25 NOTE — Assessment & Plan Note (Signed)
Goal LDL < 70. Continue statin. Will recheck lipids and get him back to lipid clinic for f/u.

## 2011-08-26 ENCOUNTER — Other Ambulatory Visit (INDEPENDENT_AMBULATORY_CARE_PROVIDER_SITE_OTHER): Payer: Medicare Other

## 2011-08-26 DIAGNOSIS — I10 Essential (primary) hypertension: Secondary | ICD-10-CM

## 2011-08-26 DIAGNOSIS — I2510 Atherosclerotic heart disease of native coronary artery without angina pectoris: Secondary | ICD-10-CM

## 2011-08-26 DIAGNOSIS — I6529 Occlusion and stenosis of unspecified carotid artery: Secondary | ICD-10-CM

## 2011-08-26 DIAGNOSIS — E785 Hyperlipidemia, unspecified: Secondary | ICD-10-CM

## 2011-08-26 LAB — COMPREHENSIVE METABOLIC PANEL
ALT: 18 U/L (ref 0–53)
AST: 23 U/L (ref 0–37)
Albumin: 3.9 g/dL (ref 3.5–5.2)
Alkaline Phosphatase: 58 U/L (ref 39–117)
BUN: 14 mg/dL (ref 6–23)
CO2: 28 mEq/L (ref 19–32)
Calcium: 9.4 mg/dL (ref 8.4–10.5)
Chloride: 108 mEq/L (ref 96–112)
Creatinine, Ser: 0.7 mg/dL (ref 0.4–1.5)
GFR: 127.34 mL/min (ref >60.00)
Glucose, Bld: 98 mg/dL (ref 70–99)
Potassium: 4.3 mEq/L (ref 3.5–5.1)
Sodium: 141 mEq/L (ref 135–145)
Total Bilirubin: 1 mg/dL (ref 0.3–1.2)
Total Protein: 6.9 g/dL (ref 6.0–8.3)

## 2011-08-26 LAB — LIPID PANEL
Cholesterol: 166 mg/dL (ref 0–200)
HDL: 57.1 mg/dL (ref >39.00)
LDL Cholesterol: 97 mg/dL (ref 0–99)
Total CHOL/HDL Ratio: 3
Triglycerides: 58 mg/dL (ref 0.0–149.0)
VLDL: 11.6 mg/dL (ref 0.0–40.0)

## 2011-08-27 ENCOUNTER — Encounter (HOSPITAL_COMMUNITY)
Admission: RE | Admit: 2011-08-27 | Discharge: 2011-08-27 | Disposition: A | Discharge status: Home / Self Care | Payer: Medicare Other | Source: Ambulatory Visit | Attending: Pulmonary Disease | Admitting: Pulmonary Disease

## 2011-08-27 NOTE — Progress Notes (Signed)
Reviewed home exercise with pt today.  Pt plans to walk at home for exercise.  Reviewed THR, pulse, RPE, sign and symptoms, and when to call 911 or MD.  Pt voiced understanding.  

## 2011-08-30 ENCOUNTER — Encounter (HOSPITAL_COMMUNITY): Payer: Medicare Other

## 2011-09-01 ENCOUNTER — Encounter (HOSPITAL_COMMUNITY): Payer: Medicare Other

## 2011-09-03 ENCOUNTER — Encounter (HOSPITAL_COMMUNITY): Payer: Medicare Other

## 2011-09-06 ENCOUNTER — Encounter (HOSPITAL_COMMUNITY): Payer: Medicare Other

## 2011-09-08 ENCOUNTER — Encounter (HOSPITAL_COMMUNITY): Payer: Medicare Other

## 2011-09-08 NOTE — Progress Notes (Signed)
Pt was notified.  

## 2011-09-10 ENCOUNTER — Encounter (HOSPITAL_COMMUNITY): Payer: Medicare Other

## 2011-09-13 ENCOUNTER — Encounter (HOSPITAL_COMMUNITY): Payer: Medicare Other

## 2011-09-15 ENCOUNTER — Encounter (HOSPITAL_COMMUNITY): Payer: Medicare Other

## 2011-09-15 DIAGNOSIS — Z951 Presence of aortocoronary bypass graft: Secondary | ICD-10-CM | POA: Insufficient documentation

## 2011-09-15 DIAGNOSIS — E876 Hypokalemia: Secondary | ICD-10-CM | POA: Insufficient documentation

## 2011-09-15 DIAGNOSIS — Z5189 Encounter for other specified aftercare: Secondary | ICD-10-CM | POA: Insufficient documentation

## 2011-09-15 DIAGNOSIS — Z96649 Presence of unspecified artificial hip joint: Secondary | ICD-10-CM | POA: Insufficient documentation

## 2011-09-15 DIAGNOSIS — I200 Unstable angina: Secondary | ICD-10-CM | POA: Insufficient documentation

## 2011-09-15 DIAGNOSIS — I2510 Atherosclerotic heart disease of native coronary artery without angina pectoris: Secondary | ICD-10-CM | POA: Insufficient documentation

## 2011-09-15 DIAGNOSIS — E785 Hyperlipidemia, unspecified: Secondary | ICD-10-CM | POA: Insufficient documentation

## 2011-09-17 ENCOUNTER — Encounter (HOSPITAL_COMMUNITY): Payer: Medicare Other

## 2011-09-20 ENCOUNTER — Telehealth: Payer: Self-pay | Admitting: Internal Medicine

## 2011-09-20 ENCOUNTER — Encounter (HOSPITAL_COMMUNITY)
Admission: RE | Admit: 2011-09-20 | Discharge: 2011-09-20 | Disposition: A | Discharge status: Home / Self Care | Payer: Medicare Other | Source: Ambulatory Visit | Attending: Internal Medicine | Admitting: Internal Medicine

## 2011-09-20 NOTE — Telephone Encounter (Signed)
New problem:  Patient calling has general question - daily activities.

## 2011-09-20 NOTE — Progress Notes (Signed)
[name] 71 y.o. male Nutrition Note  Spoke with pt.  Nutrition Plan and Nutrition Survey reviewed with pt.  Weight loss tips discussed. Pt expressed understanding.  Nutrition Diagnosis   Food-and nutrition-related knowledge deficit related to lack of exposure to information as related to diagnosis of: ? CVD   Overweight related to excessive energy intake as evidenced by a BMI of 29.1  Nutrition RX/ Estimated Daily Nutrition Needs for: wt loss 1400-1900 Kcal, 35-50 gm fat, 10-14 gm sat fat, 1.3-1.9 gm trans-fat, <1500 mg sodium Nutrition Intervention   Pt's individual nutrition plan including cholesterol goals reviewed with pt.   Benefits of adopting Therapeutic Lifestyle Changes discussed when Medficts reviewed.   Pt to attend the Portion Distortion class   Pt to attend the  ? Nutrition I class                     ? Nutrition II class    Pt given handouts for: ? wt loss   Continue client-centered nutrition education by RD, as part of interdisciplinary care. Goal(s)   Pt to identify food quantities necessary to achieve: ? wt loss to a goal wt of 162-174# at graduation from cardiac rehab.  Monitor and Evaluate progress toward nutrition goal with team.

## 2011-09-20 NOTE — Telephone Encounter (Signed)
[name] wants to know when he can get back to his daily activities which includes golfing, moving furniture and yard work?  He states his surgeon is no longer seeing him and didn't give him a date.

## 2011-09-22 ENCOUNTER — Encounter (HOSPITAL_COMMUNITY)
Admission: RE | Admit: 2011-09-22 | Discharge: 2011-09-22 | Disposition: A | Discharge status: Home / Self Care | Payer: Medicare Other | Source: Ambulatory Visit | Attending: Internal Medicine | Admitting: Internal Medicine

## 2011-09-22 NOTE — Telephone Encounter (Signed)
Pt was notified.  

## 2011-09-22 NOTE — Telephone Encounter (Signed)
He can resume activities now.   thanks

## 2011-09-24 ENCOUNTER — Encounter (HOSPITAL_COMMUNITY)
Admission: RE | Admit: 2011-09-24 | Discharge: 2011-09-24 | Disposition: A | Discharge status: Home / Self Care | Payer: Medicare Other | Source: Ambulatory Visit | Attending: Internal Medicine | Admitting: Internal Medicine

## 2011-09-27 ENCOUNTER — Encounter (HOSPITAL_COMMUNITY)
Admission: RE | Admit: 2011-09-27 | Discharge: 2011-09-27 | Disposition: A | Discharge status: Home / Self Care | Payer: Medicare Other | Source: Ambulatory Visit | Attending: Internal Medicine | Admitting: Internal Medicine

## 2011-09-27 NOTE — Progress Notes (Signed)
Moderate resting and exertional blood pressure elevations at cardiac rehab.  Will scan exercise flow sheets from cardiac rehab for Dr Gala Romney to review.

## 2011-09-29 ENCOUNTER — Encounter (HOSPITAL_COMMUNITY)
Admission: RE | Admit: 2011-09-29 | Discharge: 2011-09-29 | Disposition: A | Discharge status: Home / Self Care | Payer: Medicare Other | Source: Ambulatory Visit | Attending: Internal Medicine | Admitting: Internal Medicine

## 2011-10-01 ENCOUNTER — Encounter (HOSPITAL_COMMUNITY)
Admission: RE | Admit: 2011-10-01 | Discharge: 2011-10-01 | Disposition: A | Discharge status: Home / Self Care | Payer: Medicare Other | Source: Ambulatory Visit | Attending: Internal Medicine | Admitting: Internal Medicine

## 2011-10-04 ENCOUNTER — Encounter (HOSPITAL_COMMUNITY)
Admission: RE | Admit: 2011-10-04 | Discharge: 2011-10-04 | Disposition: A | Discharge status: Home / Self Care | Payer: Medicare Other | Source: Ambulatory Visit | Attending: Internal Medicine | Admitting: Internal Medicine

## 2011-10-06 ENCOUNTER — Encounter (HOSPITAL_COMMUNITY)
Admission: RE | Admit: 2011-10-06 | Discharge: 2011-10-06 | Disposition: A | Discharge status: Home / Self Care | Payer: Medicare Other | Source: Ambulatory Visit | Attending: Internal Medicine | Admitting: Internal Medicine

## 2011-10-08 ENCOUNTER — Encounter (HOSPITAL_COMMUNITY)
Admission: RE | Admit: 2011-10-08 | Discharge: 2011-10-08 | Disposition: A | Discharge status: Home / Self Care | Payer: Medicare Other | Source: Ambulatory Visit | Attending: Internal Medicine | Admitting: Internal Medicine

## 2011-10-11 ENCOUNTER — Encounter (HOSPITAL_COMMUNITY)
Admission: RE | Admit: 2011-10-11 | Discharge: 2011-10-11 | Disposition: A | Discharge status: Home / Self Care | Payer: Medicare Other | Source: Ambulatory Visit | Attending: Internal Medicine | Admitting: Internal Medicine

## 2011-10-13 ENCOUNTER — Encounter (HOSPITAL_COMMUNITY)
Admission: RE | Admit: 2011-10-13 | Discharge: 2011-10-13 | Disposition: A | Discharge status: Home / Self Care | Payer: Medicare Other | Source: Ambulatory Visit | Attending: Internal Medicine | Admitting: Internal Medicine

## 2011-10-15 ENCOUNTER — Encounter (HOSPITAL_COMMUNITY): Admission: RE | Admit: 2011-10-15 | Payer: Medicare Other | Source: Ambulatory Visit

## 2011-10-18 ENCOUNTER — Encounter (HOSPITAL_COMMUNITY)
Admission: RE | Admit: 2011-10-18 | Discharge: 2011-10-18 | Disposition: A | Discharge status: Home / Self Care | Payer: Medicare Other | Source: Ambulatory Visit | Attending: Internal Medicine | Admitting: Internal Medicine

## 2011-10-18 DIAGNOSIS — I2510 Atherosclerotic heart disease of native coronary artery without angina pectoris: Secondary | ICD-10-CM | POA: Insufficient documentation

## 2011-10-18 DIAGNOSIS — E876 Hypokalemia: Secondary | ICD-10-CM | POA: Insufficient documentation

## 2011-10-18 DIAGNOSIS — E785 Hyperlipidemia, unspecified: Secondary | ICD-10-CM | POA: Insufficient documentation

## 2011-10-18 DIAGNOSIS — I200 Unstable angina: Secondary | ICD-10-CM | POA: Insufficient documentation

## 2011-10-18 DIAGNOSIS — Z5189 Encounter for other specified aftercare: Secondary | ICD-10-CM | POA: Insufficient documentation

## 2011-10-18 DIAGNOSIS — Z96649 Presence of unspecified artificial hip joint: Secondary | ICD-10-CM | POA: Insufficient documentation

## 2011-10-18 DIAGNOSIS — Z951 Presence of aortocoronary bypass graft: Secondary | ICD-10-CM | POA: Insufficient documentation

## 2011-10-20 ENCOUNTER — Encounter (HOSPITAL_COMMUNITY)
Admission: RE | Admit: 2011-10-20 | Discharge: 2011-10-20 | Disposition: A | Discharge status: Home / Self Care | Payer: Medicare Other | Source: Ambulatory Visit | Attending: Internal Medicine | Admitting: Internal Medicine

## 2011-10-22 ENCOUNTER — Encounter (HOSPITAL_COMMUNITY)
Admission: RE | Admit: 2011-10-22 | Discharge: 2011-10-22 | Disposition: A | Discharge status: Home / Self Care | Payer: Medicare Other | Source: Ambulatory Visit | Attending: Internal Medicine | Admitting: Internal Medicine

## 2011-10-25 ENCOUNTER — Encounter (HOSPITAL_COMMUNITY)
Admission: RE | Admit: 2011-10-25 | Discharge: 2011-10-25 | Disposition: A | Discharge status: Home / Self Care | Payer: Medicare Other | Source: Ambulatory Visit | Attending: Internal Medicine | Admitting: Internal Medicine

## 2011-10-27 ENCOUNTER — Encounter (HOSPITAL_COMMUNITY)
Admission: RE | Admit: 2011-10-27 | Discharge: 2011-10-27 | Disposition: A | Discharge status: Home / Self Care | Payer: Medicare Other | Source: Ambulatory Visit | Attending: Internal Medicine | Admitting: Internal Medicine

## 2011-10-29 ENCOUNTER — Encounter (HOSPITAL_COMMUNITY): Payer: Medicare Other

## 2011-11-01 ENCOUNTER — Encounter (HOSPITAL_COMMUNITY)
Admission: RE | Admit: 2011-11-01 | Discharge: 2011-11-01 | Disposition: A | Discharge status: Home / Self Care | Payer: Medicare Other | Source: Ambulatory Visit | Attending: Internal Medicine | Admitting: Internal Medicine

## 2011-11-03 ENCOUNTER — Encounter (HOSPITAL_COMMUNITY)
Admission: RE | Admit: 2011-11-03 | Discharge: 2011-11-03 | Disposition: A | Discharge status: Home / Self Care | Payer: Medicare Other | Source: Ambulatory Visit | Attending: Internal Medicine | Admitting: Internal Medicine

## 2011-11-05 ENCOUNTER — Encounter (HOSPITAL_COMMUNITY)
Admission: RE | Admit: 2011-11-05 | Discharge: 2011-11-05 | Disposition: A | Discharge status: Home / Self Care | Payer: Medicare Other | Source: Ambulatory Visit | Attending: Internal Medicine | Admitting: Internal Medicine

## 2011-11-08 ENCOUNTER — Encounter (HOSPITAL_COMMUNITY)
Admission: RE | Admit: 2011-11-08 | Discharge: 2011-11-08 | Disposition: A | Discharge status: Home / Self Care | Payer: Medicare Other | Source: Ambulatory Visit | Attending: Internal Medicine | Admitting: Internal Medicine

## 2011-11-10 ENCOUNTER — Telehealth (HOSPITAL_COMMUNITY): Payer: Self-pay | Admitting: *Deleted

## 2011-11-10 ENCOUNTER — Other Ambulatory Visit (INDEPENDENT_AMBULATORY_CARE_PROVIDER_SITE_OTHER): Payer: Medicare Other

## 2011-11-10 ENCOUNTER — Encounter (HOSPITAL_COMMUNITY)
Admission: RE | Admit: 2011-11-10 | Discharge: 2011-11-10 | Disposition: A | Discharge status: Home / Self Care | Payer: Medicare Other | Source: Ambulatory Visit | Attending: Internal Medicine | Admitting: Internal Medicine

## 2011-11-10 DIAGNOSIS — I2510 Atherosclerotic heart disease of native coronary artery without angina pectoris: Secondary | ICD-10-CM

## 2011-11-10 LAB — MAGNESIUM: Magnesium: 1.8 mg/dL (ref 1.5–2.5)

## 2011-11-10 LAB — BASIC METABOLIC PANEL
BUN: 16 mg/dL (ref 6–23)
CO2: 27 mEq/L (ref 19–32)
Calcium: 9.3 mg/dL (ref 8.4–10.5)
Chloride: 105 mEq/L (ref 96–112)
Creatinine, Ser: 0.7 mg/dL (ref 0.4–1.5)
GFR: 125.07 mL/min (ref >60.00)
Glucose, Bld: 102 mg/dL — ABNORMAL HIGH (ref 70–99)
Potassium: 4.6 mEq/L (ref 3.5–5.1)
Sodium: 137 mEq/L (ref 135–145)

## 2011-11-10 NOTE — Progress Notes (Signed)
Increased PVC's noted today during exercise at cardiac rehab.  Patient asymptomatic vital signs stable.  Dr Gala Romney  Reviewed ECG tracings. Dr Gala Romney said he will check a BMET and Magnesium level.  Otherwise no new orders received.  [place]and plans to return to exercise on Monday.

## 2011-11-10 NOTE — Telephone Encounter (Signed)
Dr Gala Romney received info from cardiac rehab about pt having PVCs today, per Dr Gala Romney pt needs bmet and mag today he will go to [place] and have done now

## 2011-11-12 ENCOUNTER — Telehealth (HOSPITAL_COMMUNITY): Payer: Self-pay | Admitting: *Deleted

## 2011-11-12 ENCOUNTER — Encounter (HOSPITAL_COMMUNITY): Payer: Medicare Other

## 2011-11-12 MED ORDER — MAGNESIUM OXIDE 400 MG PO TABS: 400.0000 mg | ORAL_TABLET | Freq: Every day | ORAL | Status: DC

## 2011-11-12 NOTE — Telephone Encounter (Signed)
[name] called regarding his blood work, he missed the call from [place]. He would like someone to call him back.  Thanks.

## 2011-11-12 NOTE — Telephone Encounter (Signed)
Provided lab results. Instructed to take Mag Oxide 400 mg daily.

## 2011-11-15 ENCOUNTER — Encounter (HOSPITAL_COMMUNITY)
Admission: RE | Admit: 2011-11-15 | Discharge: 2011-11-15 | Disposition: A | Discharge status: Home / Self Care | Payer: Medicare Other | Source: Ambulatory Visit | Attending: Internal Medicine | Admitting: Internal Medicine

## 2011-11-15 DIAGNOSIS — E876 Hypokalemia: Secondary | ICD-10-CM | POA: Insufficient documentation

## 2011-11-15 DIAGNOSIS — Z96649 Presence of unspecified artificial hip joint: Secondary | ICD-10-CM | POA: Insufficient documentation

## 2011-11-15 DIAGNOSIS — Z5189 Encounter for other specified aftercare: Secondary | ICD-10-CM | POA: Insufficient documentation

## 2011-11-15 DIAGNOSIS — I200 Unstable angina: Secondary | ICD-10-CM | POA: Insufficient documentation

## 2011-11-15 DIAGNOSIS — I2510 Atherosclerotic heart disease of native coronary artery without angina pectoris: Secondary | ICD-10-CM | POA: Insufficient documentation

## 2011-11-15 DIAGNOSIS — Z951 Presence of aortocoronary bypass graft: Secondary | ICD-10-CM | POA: Insufficient documentation

## 2011-11-15 DIAGNOSIS — E785 Hyperlipidemia, unspecified: Secondary | ICD-10-CM | POA: Insufficient documentation

## 2011-11-15 NOTE — Progress Notes (Signed)
Mikhail exercise without complaints today. Goldie started taking his magnesium supplement yesterday.

## 2011-11-15 NOTE — Progress Notes (Addendum)
Nutrition Consult Spoke with pt per pt request.  Pt was started on a Magnesium supplement due to Magnesium depletion. Pt interested in increasing his dietary sources of Magnesium to be able to eventually not take the Magnesium supplement.  Sources of Magnesium explained.  Pt given a handout re: Food sources of Magnesium. Pt encouraged to make sure he consumes an additional 400 mg of Magnesium via the diet if he wants to ingest the equivalent of the supplement he is taking. Pt expressed understanding.

## 2011-11-16 ENCOUNTER — Encounter (INDEPENDENT_AMBULATORY_CARE_PROVIDER_SITE_OTHER): Payer: Medicare Other

## 2011-11-16 DIAGNOSIS — I6529 Occlusion and stenosis of unspecified carotid artery: Secondary | ICD-10-CM

## 2011-11-17 ENCOUNTER — Encounter (HOSPITAL_COMMUNITY)
Admission: RE | Admit: 2011-11-17 | Discharge: 2011-11-17 | Disposition: A | Discharge status: Home / Self Care | Payer: Medicare Other | Source: Ambulatory Visit | Attending: Internal Medicine | Admitting: Internal Medicine

## 2011-11-19 ENCOUNTER — Encounter (HOSPITAL_COMMUNITY)
Admission: RE | Admit: 2011-11-19 | Discharge: 2011-11-19 | Disposition: A | Discharge status: Home / Self Care | Payer: Medicare Other | Source: Ambulatory Visit | Attending: Internal Medicine | Admitting: Internal Medicine

## 2011-11-22 ENCOUNTER — Encounter (HOSPITAL_COMMUNITY)
Admission: RE | Admit: 2011-11-22 | Discharge: 2011-11-22 | Disposition: A | Discharge status: Home / Self Care | Payer: Medicare Other | Source: Ambulatory Visit | Attending: Internal Medicine | Admitting: Internal Medicine

## 2011-11-24 ENCOUNTER — Encounter (HOSPITAL_COMMUNITY)
Admission: RE | Admit: 2011-11-24 | Discharge: 2011-11-24 | Disposition: A | Discharge status: Home / Self Care | Payer: Medicare Other | Source: Ambulatory Visit | Attending: Internal Medicine | Admitting: Internal Medicine

## 2011-11-25 ENCOUNTER — Other Ambulatory Visit: Payer: Self-pay | Admitting: *Deleted

## 2011-11-25 MED ORDER — LOSARTAN POTASSIUM 100 MG PO TABS: 100.0000 mg | ORAL_TABLET | Freq: Every day | ORAL | Status: DC

## 2011-11-26 ENCOUNTER — Encounter (HOSPITAL_COMMUNITY)
Admission: RE | Admit: 2011-11-26 | Discharge: 2011-11-26 | Disposition: A | Discharge status: Home / Self Care | Payer: Medicare Other | Source: Ambulatory Visit | Attending: Internal Medicine | Admitting: Internal Medicine

## 2011-11-29 ENCOUNTER — Encounter (HOSPITAL_COMMUNITY)
Admission: RE | Admit: 2011-11-29 | Discharge: 2011-11-29 | Disposition: A | Discharge status: Home / Self Care | Payer: Medicare Other | Source: Ambulatory Visit | Attending: Internal Medicine | Admitting: Internal Medicine

## 2011-12-01 ENCOUNTER — Encounter (HOSPITAL_COMMUNITY)
Admission: RE | Admit: 2011-12-01 | Discharge: 2011-12-01 | Disposition: A | Discharge status: Home / Self Care | Payer: Medicare Other | Source: Ambulatory Visit | Attending: Internal Medicine | Admitting: Internal Medicine

## 2011-12-03 ENCOUNTER — Encounter (HOSPITAL_COMMUNITY)
Admission: RE | Admit: 2011-12-03 | Discharge: 2011-12-03 | Disposition: A | Discharge status: Home / Self Care | Payer: Medicare Other | Source: Ambulatory Visit | Attending: Internal Medicine | Admitting: Internal Medicine

## 2011-12-06 ENCOUNTER — Encounter (HOSPITAL_COMMUNITY)
Admission: RE | Admit: 2011-12-06 | Discharge: 2011-12-06 | Disposition: A | Discharge status: Home / Self Care | Payer: Medicare Other | Source: Ambulatory Visit | Attending: Internal Medicine | Admitting: Internal Medicine

## 2011-12-08 ENCOUNTER — Encounter (HOSPITAL_COMMUNITY)
Admission: RE | Admit: 2011-12-08 | Discharge: 2011-12-08 | Disposition: A | Discharge status: Home / Self Care | Payer: Medicare Other | Source: Ambulatory Visit | Attending: Internal Medicine | Admitting: Internal Medicine

## 2011-12-10 ENCOUNTER — Encounter (HOSPITAL_COMMUNITY)
Admission: RE | Admit: 2011-12-10 | Discharge: 2011-12-10 | Disposition: A | Discharge status: Home / Self Care | Payer: Medicare Other | Source: Ambulatory Visit | Attending: Internal Medicine | Admitting: Internal Medicine

## 2011-12-13 ENCOUNTER — Encounter (HOSPITAL_COMMUNITY): Payer: Medicare Other

## 2011-12-15 ENCOUNTER — Encounter (HOSPITAL_COMMUNITY): Payer: Medicare Other | Attending: Internal Medicine

## 2011-12-15 DIAGNOSIS — I2510 Atherosclerotic heart disease of native coronary artery without angina pectoris: Secondary | ICD-10-CM | POA: Insufficient documentation

## 2011-12-15 DIAGNOSIS — Z951 Presence of aortocoronary bypass graft: Secondary | ICD-10-CM | POA: Insufficient documentation

## 2011-12-15 DIAGNOSIS — E785 Hyperlipidemia, unspecified: Secondary | ICD-10-CM | POA: Insufficient documentation

## 2011-12-15 DIAGNOSIS — I200 Unstable angina: Secondary | ICD-10-CM | POA: Insufficient documentation

## 2011-12-15 DIAGNOSIS — E876 Hypokalemia: Secondary | ICD-10-CM | POA: Insufficient documentation

## 2011-12-15 DIAGNOSIS — Z96649 Presence of unspecified artificial hip joint: Secondary | ICD-10-CM | POA: Insufficient documentation

## 2011-12-15 DIAGNOSIS — Z5189 Encounter for other specified aftercare: Secondary | ICD-10-CM | POA: Insufficient documentation

## 2011-12-17 ENCOUNTER — Encounter (HOSPITAL_COMMUNITY): Payer: Medicare Other

## 2012-02-04 NOTE — Progress Notes (Signed)
Cardiac Rehabilitation Program Outcomes Report   Orientation:  08/03/2011 Graduate Date:  12/10/2011  # of sessions completed: 36/36  Cardiologist: Bensimhon Family MD:  Dory Horn Time:  0945  A.  Exercise Program:  Tolerates exercise @ 4.6 METS for 30 minutes, Bike Test Results:  Pre: 1.00 mile and Post: 1.22 miles, Improved functional capacity  22 %, Improved  muscular strength  17.1 %, Improved  flexibility 28.6 %, Improved education score 4 % and Discharged to home exercise program.  Anticipated compliance:  excellent  B.  Mental Health:  Good mental attitude and Quality of Life (QOL)  changes:  Overall  -11.4 %, Health/Functioning -3.7 %, Socioeconomics -25.3 %, Psych/Spiritual -14.3 %, Family -10.5 %    C.  Education/Instruction/Skills  Knows THR (16-109) for exercise, Uses Perceived Exertion Scale and/or Dyspnea Scale and Attended 10/13 education classes  Home exercise given: 08/27/11  D.  Nutrition/Weight Control/Body Composition: Pt following a step 2 Therapeutic Lifestyle Changes diet. Pt with 4.3% decrease in %body fat. Pt wt is up 2.6 kg. BMI 30.0 and 29.2% body fat%. Section Completed by: Mickle Plumb, M.Ed, RD, LDN, CDE   E.  Blood Lipids    Lab Results  Component Value Date   CHOL 166 08/26/2011   HDL 57.10 08/26/2011   LDLCALC 97 08/26/2011   TRIG 58.0 08/26/2011   CHOLHDL 3 08/26/2011    F.  Lifestyle Changes:  Making positive lifestyle changes  G.  Symptoms noted with exercise:  Asymptomatic  Report Completed By:  Fabio Pierce, MA, ACSM RCEP    Comments:  Pt did well in program progressing from 2.8 METs to 4.6 METs.  Pt plans to continue to exercise by walking at home.  At d/c pt was in normal sinus.  Thanks for the referral.        Agree with progress report from cardiac rehab on 02/09/2012 written by Trevor Mace and and Michelene Heady RN

## 2012-02-08 ENCOUNTER — Telehealth: Payer: Self-pay | Admitting: Internal Medicine

## 2012-02-08 NOTE — Telephone Encounter (Signed)
New msg Pt wants to know should he get blood work on the day he is to see Dr Gala Romney

## 2012-02-09 ENCOUNTER — Telehealth (HOSPITAL_COMMUNITY): Payer: Self-pay | Admitting: *Deleted

## 2012-02-09 ENCOUNTER — Other Ambulatory Visit (INDEPENDENT_AMBULATORY_CARE_PROVIDER_SITE_OTHER): Payer: Medicare Other

## 2012-02-09 ENCOUNTER — Other Ambulatory Visit: Payer: Medicare Other

## 2012-02-09 DIAGNOSIS — E781 Pure hyperglyceridemia: Secondary | ICD-10-CM

## 2012-02-09 LAB — HEPATIC FUNCTION PANEL
ALT: 23 U/L (ref 0–53)
AST: 32 U/L (ref 0–37)
Albumin: 3.6 g/dL (ref 3.5–5.2)
Alkaline Phosphatase: 39 U/L (ref 39–117)
Bilirubin, Direct: 0.2 mg/dL (ref 0.0–0.3)
Total Bilirubin: 1.2 mg/dL (ref 0.3–1.2)
Total Protein: 6.2 g/dL (ref 6.0–8.3)

## 2012-02-09 LAB — LIPID PANEL
Cholesterol: 147 mg/dL (ref 0–200)
HDL: 57.2 mg/dL (ref >39.00)
LDL Cholesterol: 80 mg/dL (ref 0–99)
Total CHOL/HDL Ratio: 3
Triglycerides: 51 mg/dL (ref 0.0–149.0)
VLDL: 10.2 mg/dL (ref 0.0–40.0)

## 2012-02-09 LAB — BASIC METABOLIC PANEL
BUN: 12 mg/dL (ref 6–23)
CO2: 26 mEq/L (ref 19–32)
Calcium: 8.9 mg/dL (ref 8.4–10.5)
Chloride: 107 mEq/L (ref 96–112)
Creatinine, Ser: 0.7 mg/dL (ref 0.4–1.5)
GFR: 118.82 mL/min (ref >60.00)
Glucose, Bld: 97 mg/dL (ref 70–99)
Potassium: 4.2 mEq/L (ref 3.5–5.1)
Sodium: 139 mEq/L (ref 135–145)

## 2012-02-09 NOTE — Telephone Encounter (Signed)
See phone note dated 5/29

## 2012-02-09 NOTE — Telephone Encounter (Signed)
Pt at [place] of labs

## 2012-02-21 ENCOUNTER — Encounter (HOSPITAL_COMMUNITY): Payer: Self-pay

## 2012-02-21 ENCOUNTER — Ambulatory Visit (HOSPITAL_COMMUNITY)
Admission: RE | Admit: 2012-02-21 | Discharge: 2012-02-21 | Disposition: A | Discharge status: Home / Self Care | Payer: Medicare Other | Source: Ambulatory Visit | Attending: Internal Medicine | Admitting: Internal Medicine

## 2012-02-21 VITALS — BP 188/84 | HR 56 | Wt 193.0 lb

## 2012-02-21 DIAGNOSIS — E785 Hyperlipidemia, unspecified: Secondary | ICD-10-CM | POA: Insufficient documentation

## 2012-02-21 DIAGNOSIS — I2510 Atherosclerotic heart disease of native coronary artery without angina pectoris: Secondary | ICD-10-CM | POA: Insufficient documentation

## 2012-02-21 DIAGNOSIS — Z7982 Long term (current) use of aspirin: Secondary | ICD-10-CM | POA: Insufficient documentation

## 2012-02-21 DIAGNOSIS — R0989 Other specified symptoms and signs involving the circulatory and respiratory systems: Secondary | ICD-10-CM | POA: Insufficient documentation

## 2012-02-21 DIAGNOSIS — Z951 Presence of aortocoronary bypass graft: Secondary | ICD-10-CM | POA: Insufficient documentation

## 2012-02-21 DIAGNOSIS — I10 Essential (primary) hypertension: Secondary | ICD-10-CM | POA: Insufficient documentation

## 2012-02-21 DIAGNOSIS — Z9861 Coronary angioplasty status: Secondary | ICD-10-CM | POA: Insufficient documentation

## 2012-02-21 MED ORDER — ASPIRIN 81 MG PO TABS: 81.0000 mg | ORAL_TABLET | Freq: Every day | ORAL | Status: AC

## 2012-02-21 NOTE — Progress Notes (Signed)
Encounter addended by: Sherald Hess, NP on: 02/21/2012 10:02 AM<BR>     Documentation filed: Orders

## 2012-02-21 NOTE — Assessment & Plan Note (Signed)
Lipids look great. Given h/o increased LFTs with higher dose atorva will not increase. Discussed AIM-HIGH - will stop Niaspan.

## 2012-02-21 NOTE — Assessment & Plan Note (Signed)
Likely has a component of white-coat HTN but BPs still mildly elevated. I told him I would lean toward adding amlodipine 2.5 daily. (he was on his before). However he thinks BP is increased due to hip pain and lack of sleep. We will check BPs for 2 weeks and if SBP averages > 135 will add amlodipine. He will f/u with Dr. Thurston Hole for his hip.

## 2012-02-21 NOTE — Progress Notes (Signed)
Patient ID: DARELD MCAULIFFE, male   DOB: Nov 09, 1940, 71 y.o.   MRN: [id number]    HPI:  [name] is a very pleasant 71 year old male previously followed by Dr. Graceann Congress.  He has a history of coronary artery disease.  He has multiple angioplasties to the RCA dating back to 44.  He also has a history of hypertension and hyperlipidemia and is followed by the Lipid Clinic.  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  He was hospitalized iun 11/11 because of recurrent chest pain. He underwent drug-eluting stent to the proximal RCA July 24, 2010. Ejection fraction was 65%. He also had 99% stenosis in the distal PDA with left to right collaterals and nonobstructive disease in the left coronary tree.  Admitted in October 2012 with [place]. Found to have progressive CAD. Underwent Coronary artery bypass grafting x4 (left internal mammary artery to LAD, saphenous vein graft to circumflex marginal, saphenous vein graft to posterior descending branch of the distal right coronary, saphenous vein graft to the posterolateral branch of the distal right coronary artery).   Carotid u/s 2/12: 40-59% bilat (stable)  LDL 80. Suggested to increased Atorvastatin io 80 mg daily but previously had elevated LFTs on 80mg  daily. Tolerating Niaspan OK.   He returns for follow up. Denies SOB/PND/Orthopnea/CP. Compliant with medications. He has completed cardiac rehab. One episode of elevated BP at cardiac rehab otherwise less than 130. BP at home runs 130-140. Walks 4-5 times per week.   Lab Results  Component Value Date   CHOL 147 02/09/2012   HDL 57.20 02/09/2012   LDLCALC 80 02/09/2012   TRIG 51.0 02/09/2012   CHOLHDL 3 02/09/2012      ROS: All systems negative except as listed in HPI, PMH and Problem List.  Past Medical History  Diagnosis Date  . CAD (coronary artery disease)     s/p multiple PCIs to RCA dating back to 57. Last stent to proximal RCA in November of  2011. Also had 99% stenosis in the distal PDA with left to right collaterals and nonobstructive disease in the left coronary tree. ;  ISR of RCA 10/12 - CABG 10/9 with Dr. Zenaida Niece Trigt: L-LAD, S-OM, S-PDA  . HTN (hypertension)   . Hyperlipidemia   . 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 (stable)    Current Outpatient Prescriptions  Medication Sig Dispense Refill  . acetaminophen (TYLENOL) 500 MG tablet Take 1,000 mg by mouth daily as needed.        Marland Kitchen amoxicillin (AMOXIL) 500 MG capsule Take 500 mg by mouth 3 (three) times daily. PRIOR TO DENTAL APPOINTMENT       . aspirin 325 MG EC tablet Take 1 tablet (325 mg total) by mouth daily.      Marland Kitchen atorvastatin (LIPITOR) 40 MG tablet Take 1 tablet (40 mg total) by mouth daily.  30 tablet  11  . B Complex Vitamins (VITAMIN-B COMPLEX PO) Take by mouth. 1 tab daily       . Glucosamine HCl 1000 MG TABS Take 1 tablet by mouth daily.        Marland Kitchen losartan (COZAAR) 100 MG tablet Take 1 tablet (100 mg total) by mouth daily.  30 tablet  6  . metoprolol tartrate (LOPRESSOR) 50 MG tablet Take 1 tablet (50 mg total) by mouth 2 (two) times daily.  60 tablet  11  . niacin (NIASPAN) 1000 MG CR tablet Take 1 tablet (  1,000 mg total) by mouth at bedtime.  30 tablet  12  . Omega-3 Fatty Acids (FISH OIL DOUBLE STRENGTH) 1200 MG CAPS Take 2 capsules by mouth daily.        . Tamsulosin HCl (FLOMAX) 0.4 MG CAPS Take 0.4 mg by mouth daily. Alternating 1-2 tablets daily          PHYSICAL EXAM: Filed Vitals:   02/21/12 0901  BP: 160/80  Pulse: 56   General:  Well appearing. No resp difficulty HEENT: normal Neck: supple. JVP flat. Carotids 2+ bilaterally; no bruits. No lymphadenopathy or thryomegaly appreciated. Cor: sternal scar   PMI normal. Regular rate & rhythm. No rubs, gallops or murmurs. Lungs: clear Abdomen: soft, nontender, nondistended. No hepatosplenomegaly. No bruits or masses. Good bowel sounds. Extremities: no cyanosis, clubbing, rash,    Neuro: alert & orientedx3, cranial nerves grossly intact. Moves all 4 extremities w/o difficulty. Affect pleasant.   ECG: Sinus bradycardia 52 No ST-T wave abnormalities.      ASSESSMENT & PLAN:

## 2012-02-21 NOTE — Patient Instructions (Signed)
Decrease aspirin to 81mg  daily (coated).  Take blood pressure every day and record. Call us in 2-3 weeks with results.  Stop Niaspan.   Return in 6 weeks.

## 2012-02-21 NOTE — Assessment & Plan Note (Addendum)
No evidence of ischemia. Continue current regimen. Encouraged him to keep exercising. Will decrease ASA to 81mg  daily.

## 2012-02-22 NOTE — Progress Notes (Signed)
Encounter addended by: Deitra Mayo on: 02/22/2012  7:19 AM<BR>     Documentation filed: Charges VN

## 2012-03-13 ENCOUNTER — Telehealth (HOSPITAL_COMMUNITY): Payer: Self-pay | Admitting: *Deleted

## 2012-03-13 MED ORDER — AMLODIPINE BESYLATE 5 MG PO TABS: 5.0000 mg | ORAL_TABLET | Freq: Every day | ORAL | Status: DC

## 2012-03-13 NOTE — Telephone Encounter (Signed)
[name] called today in regards to his B/P.  Last night it was 163/63. His average has been 154/67, 13 days beginning June 10th, missing a few days.  Dr Gala Romney wanted him to call with an average. Please follow up.

## 2012-03-13 NOTE — Telephone Encounter (Signed)
Per Dr Gala Romney start pt on Amlodipine 5 mg daily,pt is aware and agreeable, rx sent in

## 2012-05-15 ENCOUNTER — Ambulatory Visit (INDEPENDENT_AMBULATORY_CARE_PROVIDER_SITE_OTHER): Payer: Medicare Other | Admitting: Family Medicine

## 2012-05-15 ENCOUNTER — Encounter: Payer: Self-pay | Admitting: Family Medicine

## 2012-05-15 VITALS — BP 150/78 | HR 83 | Temp 98.5°F | Resp 16 | Ht 67.5 in | Wt 191.4 lb

## 2012-05-15 DIAGNOSIS — K047 Periapical abscess without sinus: Secondary | ICD-10-CM

## 2012-05-15 DIAGNOSIS — K044 Acute apical periodontitis of pulpal origin: Secondary | ICD-10-CM

## 2012-05-15 MED ORDER — CEFTRIAXONE SODIUM 1 G IJ SOLR
1.0000 g | INTRAMUSCULAR | Status: DC
  Administered 2012-05-15: 1 g via INTRAMUSCULAR

## 2012-05-15 NOTE — Progress Notes (Signed)
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 [place].  He was seen last week(8 days ago) with an infection in the upper left molar area where he has a root canal at tooth #15.  Since then, he started amoxicillin, then clindamycin, then back to amoxicillin after a dental x-ray showed an abscess.  Pain and swelling continue to be a problem, with pain radiating into maxilla  PMHx:  Hip replacement and angioplasty  Objective:  NAD Obvious left cheek swelling which has underlying induration about 1 cm in diameter. The alveolar mucosa at tooth #15 is swollen with the root exposed.  Assessment:  Dental abscess.  Plan:  followup on Wednesday Rocephin today 1 gm IM Continue the amoxicillin.

## 2012-05-15 NOTE — Patient Instructions (Signed)
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 Luke Johnson.  He was seen last week(8 days ago) with an infection in the upper left molar area where he has a root canal at tooth #15.  Since then, he started amoxicillin, then clindamycin, then back to amoxicillin after a dental x-ray showed an abscess.  Pain and swelling continue to be a problem, with pain radiating into maxilla  PMHx:  Hip replacement and angioplasty  Objective:  NAD Obvious left cheek swelling which has underlying induration about 1 cm in diameter. The alveolar mucosa at tooth #15 is swollen with the root exposed.  Assessment:  Dental abscess.  Plan:  followup on Wednesday Rocephin today 1 gm IM Continue the amoxicillin. 

## 2012-05-31 ENCOUNTER — Other Ambulatory Visit: Payer: Self-pay | Admitting: Cardiology

## 2012-05-31 DIAGNOSIS — I6529 Occlusion and stenosis of unspecified carotid artery: Secondary | ICD-10-CM

## 2012-06-01 ENCOUNTER — Encounter (INDEPENDENT_AMBULATORY_CARE_PROVIDER_SITE_OTHER): Payer: Medicare Other

## 2012-06-01 DIAGNOSIS — I6529 Occlusion and stenosis of unspecified carotid artery: Secondary | ICD-10-CM

## 2012-06-01 DIAGNOSIS — R0989 Other specified symptoms and signs involving the circulatory and respiratory systems: Secondary | ICD-10-CM

## 2012-06-15 ENCOUNTER — Other Ambulatory Visit: Payer: Self-pay | Admitting: Internal Medicine

## 2012-07-08 ENCOUNTER — Other Ambulatory Visit: Payer: Self-pay | Admitting: Internal Medicine

## 2012-08-02 ENCOUNTER — Other Ambulatory Visit: Payer: Self-pay | Admitting: Physician Assistant

## 2012-08-12 ENCOUNTER — Other Ambulatory Visit: Payer: Self-pay | Admitting: Physician Assistant

## 2012-08-28 DIAGNOSIS — Z0279 Encounter for issue of other medical certificate: Secondary | ICD-10-CM

## 2012-10-08 ENCOUNTER — Other Ambulatory Visit: Payer: Self-pay | Admitting: Physician Assistant

## 2012-10-11 ENCOUNTER — Telehealth (HOSPITAL_COMMUNITY): Payer: Self-pay | Admitting: *Deleted

## 2012-10-11 DIAGNOSIS — E782 Mixed hyperlipidemia: Secondary | ICD-10-CM

## 2012-10-11 IMAGING — CR DG CHEST 2V
2 series · 2 of 2 positions shown · non-contrast
Comparison: Two-view chest x-ray 06/27/2011 and 06/25/2011.
Portable chest x-rays 06/26/2011 dating back to 06/22/2011 [facility][ALJ].

CLINICAL DATA: Postop CABG 06/22/2011.  Routine follow-up.

CHEST - 2 VIEW 07/19/2011:

[w chest pa]
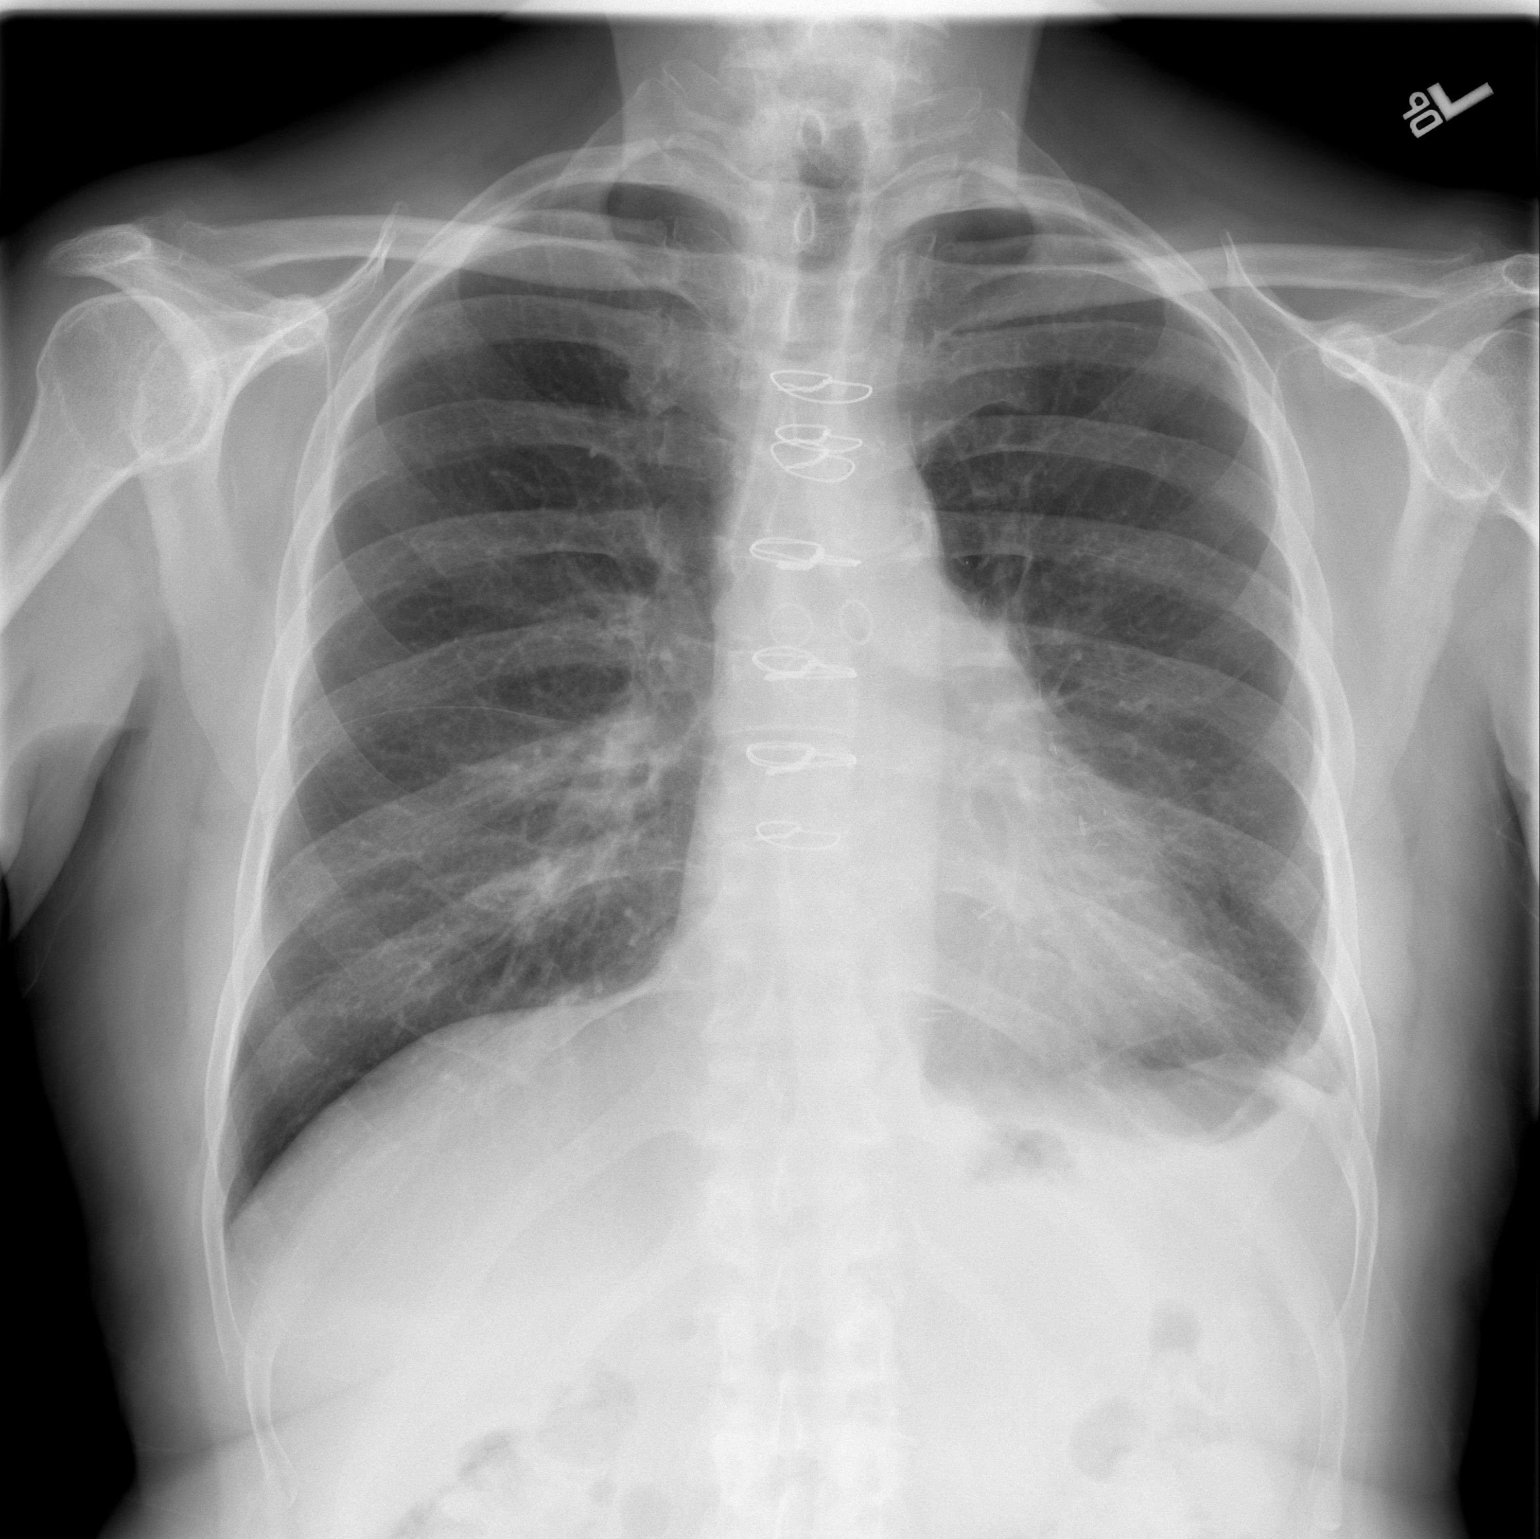

[w chest lat]
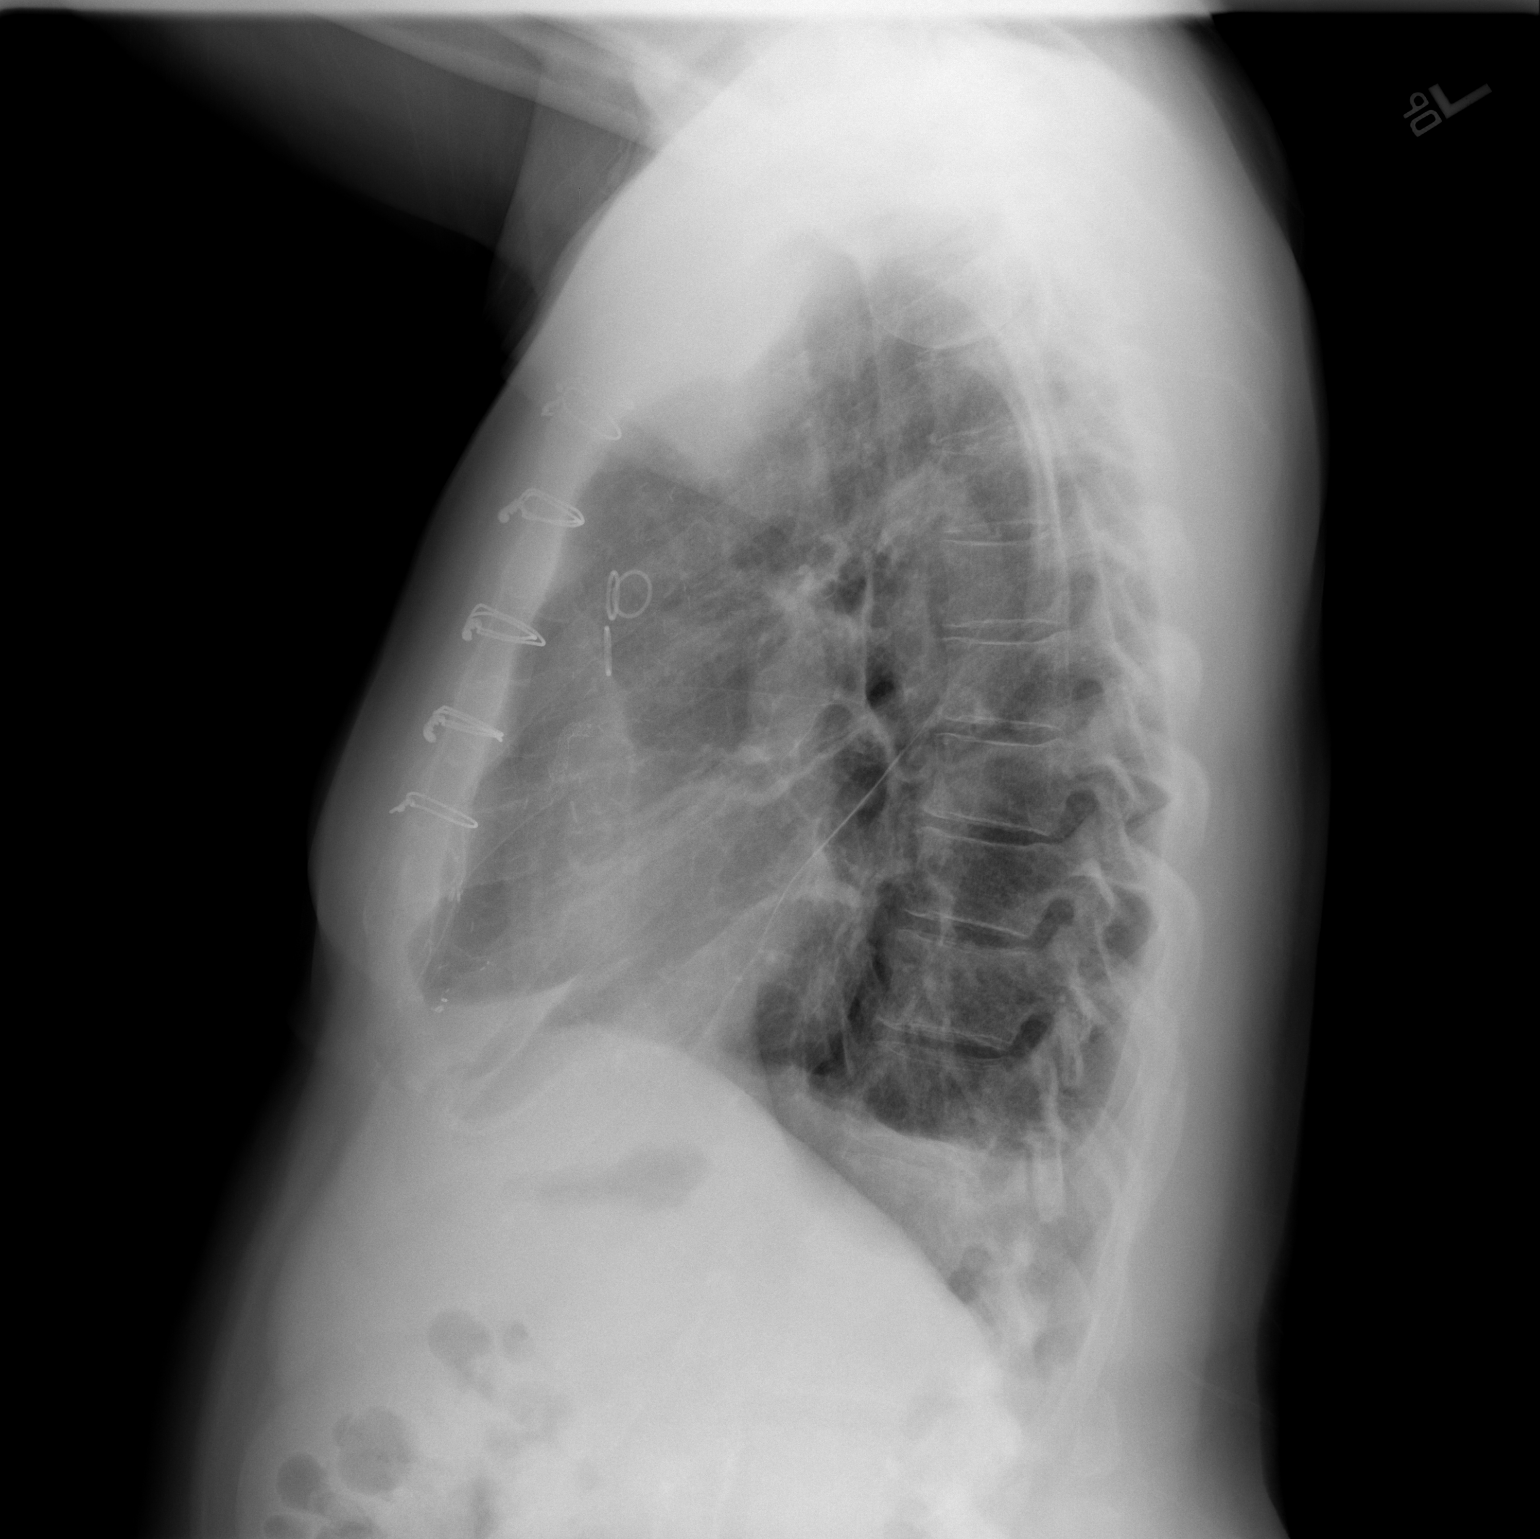

[2 of 2 positions shown; findings below may reference images not displayed]

FINDINGS: Prior sternotomy for CABG.  Cardiac silhouette upper
normal in size.  Thoracic aorta atherosclerotic, unchanged.  Hilar
and mediastinal contours otherwise unremarkable.  Interval
resolution of the right pleural effusion since the examination on
[DATE].  Residual small left pleural effusion and mild passive
atelectasis in the left lower lobe.  Lungs otherwise clear.
Pulmonary vascularity normal.  Visualized bony thorax intact.
IMPRESSION: Resolution of right pleural effusion.  Residual small left pleural
effusion and associated mild passive atelectasis in the left lower
lobe.  No new abnormalities.

## 2012-10-11 NOTE — Telephone Encounter (Signed)
Pt called to ask about labs prior to his appt, lipid panel sch for week of 2/17

## 2012-10-30 ENCOUNTER — Other Ambulatory Visit (INDEPENDENT_AMBULATORY_CARE_PROVIDER_SITE_OTHER): Payer: Medicare Other

## 2012-10-30 DIAGNOSIS — E782 Mixed hyperlipidemia: Secondary | ICD-10-CM

## 2012-10-30 LAB — BASIC METABOLIC PANEL
BUN: 14 mg/dL (ref 6–23)
CO2: 29 mEq/L (ref 19–32)
Calcium: 9.1 mg/dL (ref 8.4–10.5)
Chloride: 106 mEq/L (ref 96–112)
Creatinine, Ser: 0.8 mg/dL (ref 0.4–1.5)
GFR: 97.41 mL/min (ref >60.00)
Glucose, Bld: 94 mg/dL (ref 70–99)
Potassium: 4.3 mEq/L (ref 3.5–5.1)
Sodium: 141 mEq/L (ref 135–145)

## 2012-10-30 LAB — LIPID PANEL
Cholesterol: 186 mg/dL (ref 0–200)
HDL: 49.7 mg/dL (ref >39.00)
LDL Cholesterol: 118 mg/dL — ABNORMAL HIGH (ref 0–99)
Total CHOL/HDL Ratio: 4
Triglycerides: 90 mg/dL (ref 0.0–149.0)
VLDL: 18 mg/dL (ref 0.0–40.0)

## 2012-10-30 LAB — HEPATIC FUNCTION PANEL
ALT: 19 U/L (ref 0–53)
AST: 22 U/L (ref 0–37)
Albumin: 3.8 g/dL (ref 3.5–5.2)
Alkaline Phosphatase: 40 U/L (ref 39–117)
Bilirubin, Direct: 0.2 mg/dL (ref 0.0–0.3)
Total Bilirubin: 1.1 mg/dL (ref 0.3–1.2)
Total Protein: 6.8 g/dL (ref 6.0–8.3)

## 2012-11-06 ENCOUNTER — Ambulatory Visit (HOSPITAL_COMMUNITY)
Admission: RE | Admit: 2012-11-06 | Discharge: 2012-11-06 | Disposition: A | Discharge status: Home / Self Care | Payer: Medicare Other | Source: Ambulatory Visit | Attending: Internal Medicine | Admitting: Internal Medicine

## 2012-11-06 VITALS — BP 168/72 | HR 53 | Wt 205.0 lb

## 2012-11-06 DIAGNOSIS — I6523 Occlusion and stenosis of bilateral carotid arteries: Secondary | ICD-10-CM

## 2012-11-06 DIAGNOSIS — I2510 Atherosclerotic heart disease of native coronary artery without angina pectoris: Secondary | ICD-10-CM

## 2012-11-06 DIAGNOSIS — I6529 Occlusion and stenosis of unspecified carotid artery: Secondary | ICD-10-CM | POA: Insufficient documentation

## 2012-11-06 DIAGNOSIS — I10 Essential (primary) hypertension: Secondary | ICD-10-CM

## 2012-11-06 DIAGNOSIS — I739 Peripheral vascular disease, unspecified: Secondary | ICD-10-CM

## 2012-11-06 DIAGNOSIS — E785 Hyperlipidemia, unspecified: Secondary | ICD-10-CM

## 2012-11-06 DIAGNOSIS — I658 Occlusion and stenosis of other precerebral arteries: Secondary | ICD-10-CM

## 2012-11-06 MED ORDER — ATORVASTATIN CALCIUM 80 MG PO TABS: 80.0000 mg | ORAL_TABLET | Freq: Every day | ORAL | Status: DC

## 2012-11-06 NOTE — Assessment & Plan Note (Signed)
LDL is not at goal. Will increased atorvastatin to 80. Will refer back to Lipid Clinic.

## 2012-11-06 NOTE — Progress Notes (Signed)
HPI:  [name] is a very pleasant 72 year old male previously followed by Dr. Graceann Congress.  He has a history of coronary artery disease.  He has multiple angioplasties to the RCA dating back to 11.  He also has a history of hypertension and hyperlipidemia and is followed by the [facility].  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  He was hospitalized in 11/11 because of recurrent chest pain. He underwent drug-eluting stent to the proximal RCA July 24, 2010. Ejection fraction was 65%. He also had 99% stenosis in the distal PDA with left to right collaterals and nonobstructive disease in the left coronary tree.  Admitted in October 2012 with [place]. Found to have progressive CAD. Underwent Coronary artery bypass grafting x4 (left internal mammary artery to LAD, saphenous vein graft to circumflex marginal, saphenous vein graft to posterior descending branch of the distal right coronary, saphenous vein graft to the posterolateral branch of the distal right coronary artery).   Carotid u/s 2/12: 40-59% bilat (stable) Cartoid u/s 3/12: LICA 60-79%, RICA 40-59%  Carotid u/s 9/13 40-59% bilaterally   [facility] Riding the bike/lifting weights. No CP or under SOB. Last time he walked on TM got severe cramp in back of left leg and had to stop. Denies claudication.   Previously suggested to increased Atorvastatin to 80 mg daily but previously had elevated LFTs on 80mg  daily. Has tried Crestor in past and felt it was not effective (numbers went up). Was seen in [facility] Clinic and niaspan added to help bring numbers down.  Stopped Niaspan recently after AIM-HIGH trial.    Lab Results  Component Value Date   CHOL 186 10/30/2012   HDL 49.70 10/30/2012   LDLCALC 118* 10/30/2012   TRIG 90.0 10/30/2012   CHOLHDL 4 10/30/2012   He returns for follow up today.   Feels well today no complaints.  BP at home usually 150-155/70s.  No dyspnea, chest pain.  No  syncope.  Walking currently limited by foot pain.     ROS: All systems negative except as listed in HPI, PMH and Problem List.  Past Medical History  Diagnosis Date  . CAD (coronary artery disease)     s/p multiple PCIs to RCA dating back to 71. Last stent to proximal RCA in November of 2011. Also had 99% stenosis in the distal PDA with left to right collaterals and nonobstructive disease in the left coronary tree. ;  ISR of RCA 10/12 - CABG 10/9 with Dr. Zenaida Niece Trigt: L-LAD, S-OM, S-PDA  . HTN (hypertension)   . Hyperlipidemia   . 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 (stable)    Current Outpatient Prescriptions  Medication Sig Dispense Refill  . acetaminophen (TYLENOL) 500 MG tablet Take 1,000 mg by mouth daily as needed.        Marland Kitchen amLODipine (NORVASC) 5 MG tablet TAKE 1 TABLET ONCE DAILY.  30 tablet  6  . amoxicillin (AMOXIL) 500 MG capsule Take 500 mg by mouth 3 (three) times daily. PRIOR TO DENTAL APPOINTMENT       . aspirin 81 MG tablet Take 1 tablet (81 mg total) by mouth daily.  30 tablet  6  . atorvastatin (LIPITOR) 40 MG tablet TAKE 1 TABLET ONCE DAILY.  30 tablet  1  . B Complex Vitamins (VITAMIN-B COMPLEX PO) Take by mouth. 1 tab daily       . COZAAR 100 MG tablet  TAKE 1 TABLET ONCE DAILY.  30 tablet  6  . Glucosamine HCl 1000 MG TABS Take 1 tablet by mouth daily.        . metoprolol (LOPRESSOR) 50 MG tablet TAKE 1 TABLET TWICE DAILY.  60 tablet  5  . Omega-3 Fatty Acids (FISH OIL DOUBLE STRENGTH) 1200 MG CAPS Take 2 capsules by mouth daily.        . Tamsulosin HCl (FLOMAX) 0.4 MG CAPS Take 0.4 mg by mouth daily. Alternating 1-2 tablets daily        Current Facility-Administered Medications  Medication Dose Route Frequency Provider Last Rate Last Dose  . cefTRIAXone (ROCEPHIN) injection 1 g  1 g Intramuscular Q24H Elvina Sidle, MD   1 g at 05/15/12 1128     PHYSICAL EXAM: Filed Vitals:   11/06/12 0911  BP: 168/72  Pulse: 53  Weight: 205 lb  (92.987 kg)  SpO2: 98%    General:  Well appearing. No resp difficulty HEENT: normal Neck: supple. JVP flat. Carotids 2+ bilaterally; no bruits. No lymphadenopathy or thryomegaly appreciated. Cor: sternal scar   PMI normal. Regular rate & rhythm. No rubs, gallops or murmurs. Lungs: clear Abdomen: soft, nontender, nondistended. No hepatosplenomegaly. No bruits or masses. Good bowel sounds. Extremities: no cyanosis, clubbing, rash, tr edema. DP 1+ R nonpalpable on L Neuro: alert & orientedx3, cranial nerves grossly intact. Moves all 4 extremities w/o difficulty. Affect pleasant.   EKG: Sinus brady 57 bpm.  No ST abnormalities.    ASSESSMENT & PLAN:

## 2012-11-06 NOTE — Assessment & Plan Note (Signed)
Suspect white coat HTN. He will take BPs every for next 2 weeks and keep log. If SBP > 140 regularly he will call me.

## 2012-11-06 NOTE — Patient Instructions (Addendum)
Check BP at home, if top number is running greater than 140 please give [place] a call  Increase Atorvastatin to 80 mg daily  Your physician has requested that you have an ankle brachial index (ABI). During this test an ultrasound and blood pressure cuff are used to evaluate the arteries that supply the arms and legs with blood. Allow thirty minutes for this exam. There are no restrictions or special instructions.  You have been referred to the Lipid Clinic at [facility]

## 2012-11-06 NOTE — Assessment & Plan Note (Signed)
Will send for ABIs.

## 2012-11-06 NOTE — Assessment & Plan Note (Addendum)
Stable. Asymptomatic. Due for f/u u/s in 6 months. Continue statin.

## 2012-11-06 NOTE — Assessment & Plan Note (Signed)
No evidence of ischemia. Continue current regimen.   

## 2012-11-06 NOTE — Addendum Note (Signed)
Encounter addended by: Noralee Space, RN on: 11/06/2012 10:09 AM<BR>     Documentation filed: Patient Instructions Section, Orders

## 2012-11-07 NOTE — Addendum Note (Signed)
Encounter addended by: Simon Rhein, CCT on: 11/07/2012  8:24 AM<BR>     Documentation filed: Charges VN

## 2012-11-14 ENCOUNTER — Encounter (INDEPENDENT_AMBULATORY_CARE_PROVIDER_SITE_OTHER): Payer: Medicare Other

## 2012-11-14 ENCOUNTER — Ambulatory Visit (INDEPENDENT_AMBULATORY_CARE_PROVIDER_SITE_OTHER): Payer: Medicare Other | Admitting: Pharmacist

## 2012-11-14 VITALS — Wt 209.1 lb

## 2012-11-14 DIAGNOSIS — I70219 Atherosclerosis of native arteries of extremities with intermittent claudication, unspecified extremity: Secondary | ICD-10-CM

## 2012-11-14 DIAGNOSIS — E785 Hyperlipidemia, unspecified: Secondary | ICD-10-CM

## 2012-11-14 DIAGNOSIS — I739 Peripheral vascular disease, unspecified: Secondary | ICD-10-CM

## 2012-11-14 NOTE — Progress Notes (Signed)
Mr Garske is a pleasant 71yom referred to Lipid Clinic by cardiologist Dr. Gala Romney. He was previously in Lipid Clinic with Shelby Dubin Pharm.D. but he  stopped coming.  He states hx inc LFT with Lipitor 80mg  and therefore it was stopped by primary MD.  However, he was also taking other medications for cholesterol including Zetia, and Niaspan at that time.  He was reinitiated on Lipitor 40mh about 40yr ago with no increases in LFTs.  He has some complaints of joint aches/pain related to OA but statin induced.  The pains did not improve when off of statin in past.  He takes Glucosamine daily and Tylenol for pain prior to exercise which works well for joint pain control.    Hx  CAD with multiple stents and CABG x4 1 yr ago Hyperlipidemia EF 65%  Diet Breakfast - bran cereal, raisins, skim milk and 1/2 tsp sugar most days a week and 1 egg on english muffin with tomato and tabasco 1 day week Lunch is homemade or low salt canned soup Dinner - lean grilled meat or fish with potatoes or potato salad or pasta and steamed veggies - portions are large for evening meal Occasional snacks  Exercise 2-4 days/week strength exercises at the [facility] and 15-39min stationary bike.  Days that [facility] does not [facility] to [facility] he walks outside.  However he has been slack about his walking lately.

## 2012-11-14 NOTE — Assessment & Plan Note (Signed)
TC 147>186 (at goal < 200), TG 51>90 (at goal < 150), HDL  57> 49.7 (at goal > 40), LDL 81>118 (>goal < 100) These labs were drawn prior to pt increasing Lipitor from 40mg  to 80mg  daily and after Niaspan was stopped for about 4 months.   We spent a lengthy time discussing the role of non-statin medications, and the previously set lipid goals.  Given his CAD Hx, [name] LDL goal should be < 100 on high dose statin.  He increased his Lipitor to 80mg  daily last week.  He is also taking Fish oil 2gm daily as well.  I feel that the increased dose of Lipitor should decrease his LDL to < 100.  I also encouraged his to be compliant with his exercise regimen.  He will go to [facility] 3-4 days a week and walk the other days.  He says he normally gains some weight during the winter months but this year has been excessive.  I have encouraged him to decrease his portion size of evening meal.  This is where he says he eats the most especially if he likes what his wife has prepared.  He will return in 3 months for lipid panel and lfts to ensure no ADE as he did have increased LFT with Lipitor 80 and niaspan combo in past.

## 2012-11-14 NOTE — Patient Instructions (Signed)
Your Cholesterol is not too bad.  Your LDL is a little bit high goal being < 100. 1.  Continue Lipitor 80mg  each day 2.  Continue Fish Oil 2 caps each day 3.  Exercise 4- days a week with  At least 20-16min of cardio with your strength exercises too.   4.  Decrease portion size with meals 5.  Go to [facility] office the week before your appt for fasting blood work 6.  Follow up Cholesterol appt with Kennon Rounds June 12 Thur at 8:30

## 2012-11-27 ENCOUNTER — Telehealth (HOSPITAL_COMMUNITY): Payer: Self-pay | Admitting: Cardiology

## 2012-11-27 DIAGNOSIS — I739 Peripheral vascular disease, unspecified: Secondary | ICD-10-CM

## 2012-11-27 NOTE — Telephone Encounter (Signed)
Let's start by increasing amlodipine to 10 daily. Tell him to follow for another 2 weeks and get back to [place].

## 2012-11-27 NOTE — Telephone Encounter (Signed)
Spoke w/pt he is aware I will send to Dr Gala Romney and get back to him

## 2012-11-27 NOTE — Telephone Encounter (Signed)
Pt was told to call with a few b/p readings after two weeks.  Pt states his bp has been ranging from 158/66-200/72. Asymptomatic   Please call pt is he needs to do anything further

## 2012-11-28 MED ORDER — AMLODIPINE BESYLATE 10 MG PO TABS: 10.0000 mg | ORAL_TABLET | Freq: Every day | ORAL | Status: DC

## 2012-11-28 NOTE — Telephone Encounter (Signed)
Pt aware new rx sent to [facility] he will continue to monitor BP, also reviewed ABI's per Dr Gala Romney pt needs referral to [facility] clinic, pt aware referral made to [facility] they will contact pt with appt

## 2012-12-25 ENCOUNTER — Ambulatory Visit (INDEPENDENT_AMBULATORY_CARE_PROVIDER_SITE_OTHER): Payer: Medicare Other | Admitting: Cardiovascular Disease

## 2012-12-25 ENCOUNTER — Encounter: Payer: Self-pay | Admitting: Cardiovascular Disease

## 2012-12-25 VITALS — BP 151/63 | HR 55 | Ht 67.0 in | Wt 206.0 lb

## 2012-12-25 DIAGNOSIS — I70219 Atherosclerosis of native arteries of extremities with intermittent claudication, unspecified extremity: Secondary | ICD-10-CM

## 2012-12-25 NOTE — Patient Instructions (Signed)
Your physician recommends that you schedule a follow-up appointment as needed.   Your physician recommends that you continue on your current medications as directed. Please refer to the Current Medication list given to you today.  

## 2012-12-25 NOTE — Progress Notes (Signed)
HPI:  72 year old gentleman presenting for evaluation of peripheral arterial disease. He has known coronary artery disease and underwent CABG in 2012. He had previously undergone PCI.  Findings of the pain behind the right knee with ambulation. This led to a noninvasive lower extremity arterial study. This demonstrated noncompressible tibial arteries. His ABIs were 1.1 on the right and 1.0 on the left. His toe brachial indices were 0.77 on the right and 0.63 on the left. Waveforms were triphasic.  His leg pain has resolved. He walks the [employer] 3 days per week. His walking is limited by low back pain radiating into the buttocks. He denies thigh or calf pain with ambulation. He has some cramping of the left foot at times. This is not consistently related to exertion. He denies any ulcers or sores on the feet.  The patient has no history of stroke or TIA. He is a lifelong nonsmoker. There is no history of aneurysm in the family. He denies chest pain or dyspnea. He does complain of numbness along the medial side of the left leg ever since his bypass surgery.  He's been working on blood pressure management and continues to note elevated systolic readings at home.  Outpatient Encounter Prescriptions as of 12/25/2012  Medication Sig Dispense Refill  . acetaminophen (TYLENOL) 500 MG tablet Take 1,000 mg by mouth daily as needed.        Marland Kitchen amLODipine (NORVASC) 10 MG tablet Take 1 tablet (10 mg total) by mouth daily.  30 tablet  6  . amoxicillin (AMOXIL) 500 MG capsule Take 500 mg by mouth 3 (three) times daily. PRIOR TO DENTAL APPOINTMENT       . aspirin 81 MG tablet Take 1 tablet (81 mg total) by mouth daily.  30 tablet  6  . atorvastatin (LIPITOR) 80 MG tablet Take 1 tablet (80 mg total) by mouth daily.  30 tablet  6  . B Complex Vitamins (VITAMIN-B COMPLEX PO) Take by mouth. 1 tab daily       . COZAAR 100 MG tablet TAKE 1 TABLET ONCE DAILY.  30 tablet  6  . Glucosamine HCl 1000 MG TABS Take 1 tablet by  mouth daily.        . metoprolol (LOPRESSOR) 50 MG tablet TAKE 1 TABLET TWICE DAILY.  60 tablet  5  . Omega-3 Fatty Acids (FISH OIL DOUBLE STRENGTH) 1200 MG CAPS Take 2 capsules by mouth daily.        . Tamsulosin HCl (FLOMAX) 0.4 MG CAPS Take 0.4 mg by mouth daily. Alternating 1-2 tablets daily        Facility-Administered Encounter Medications as of 12/25/2012  Medication Dose Route Frequency Provider Last Rate Last Dose  . cefTRIAXone (ROCEPHIN) injection 1 g  1 g Intramuscular Q24H Elvina Sidle, MD   1 g at 05/15/12 1128    Morphine  Past Medical History  Diagnosis Date  . CAD (coronary artery disease)     s/p multiple PCIs to RCA dating back to 52. Last stent to proximal RCA in November of 2011. Also had 99% stenosis in the distal PDA with left to right collaterals and nonobstructive disease in the left coronary tree. ;  ISR of RCA 10/12 - CABG 10/9 with Dr. Zenaida Niece Trigt: L-LAD, S-OM, S-PDA  . HTN (hypertension)   . Hyperlipidemia   . 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 (stable)    Past Surgical History  Procedure Laterality Date  . Left total hip  replacement    . Coronary stent placement  Nov 2011    prox RCA. Has had prior multiple procedures.  . Coronary artery bypass grafting x4 (left internal mammary artery  06/23/2011    History   Social History  . Marital Status: Married    Spouse Name: N/A    Number of Children: N/A  . Years of Education: N/A   Occupational History  . Not on file.   Social History Main Topics  . Smoking status: Never Smoker   . Smokeless tobacco: Not on file  . Alcohol Use: 0.0 oz/week     Comment: occasionally  . Drug Use: No  . Sexually Active: Not on file   Other Topics Concern  . Not on file   Social History Narrative  . No narrative on file    Family History  Problem Relation Age of Onset  . Heart disease Father     ROS:  General: no fevers/chills/night sweats Eyes: no blurry vision, diplopia, or  amaurosis ENT: no sore throat or hearing loss Resp: no cough, wheezing, or hemoptysis CV: no edema or palpitations GI: no abdominal pain, nausea, vomiting, diarrhea, or constipation GU: Positive for urinary frequency and nocturia Skin: no rash Neuro: no headache Musculoskeletal: no joint pain or swelling Heme: no bleeding, DVT, or easy bruising Endo: no polydipsia or polyuria  BP 151/63  Pulse 55  Ht 5\' 7"  (1.702 m)  Wt 93.441 kg (206 lb)  BMI 32.26 kg/m2  PHYSICAL EXAM: Pt is alert and oriented, WD, WN, in no distress. HEENT: normal Neck: JVP normal. Carotid upstrokes normal without bruits. No thyromegaly. Lungs: equal expansion, clear bilaterally CV: Apex is discrete and nondisplaced, RRR without murmur or gallop Abd: soft, NT, +BS, no bruit, no hepatosplenomegaly Back: no CVA tenderness Ext: 1+ edema both ankles        Femoral pulses 2+= without bruits        PT pulses are 2+ and equal bilaterally, DP pulses are nonpalpable Skin: warm and dry without rash Neuro: CNII-XII intact             Strength intact = bilaterally  ASSESSMENT AND PLAN: 1. Lower extremity peripheral arterial disease. The patient has noncompressible tibial vessels. His posterior tibial pulses are intact and normal. I reviewed his noninvasive studies. He does not have symptoms typical of claudication at this time. He's on a good medical program and no further evaluation is required at this time. I will see him back as needed. We discussed symptoms of claudication and he will notify me if these recur.  2. Hypertension with suboptimal control. Meds are reviewed and I recommended the addition of hydrochlorothiazide. He would like to talk this over with Dr. Gala Romney before starting. His systolic blood pressure remains in the 150s.  For follow-up I'll see him back as needed.  Tonny Bollman 12/25/2012 10:16 AM

## 2013-01-12 ENCOUNTER — Encounter: Payer: Self-pay | Admitting: Pharmacist

## 2013-02-02 ENCOUNTER — Other Ambulatory Visit: Payer: Self-pay | Admitting: Internal Medicine

## 2013-02-02 DIAGNOSIS — I10 Essential (primary) hypertension: Secondary | ICD-10-CM

## 2013-02-18 ENCOUNTER — Other Ambulatory Visit: Payer: Self-pay | Admitting: Physician Assistant

## 2013-02-21 ENCOUNTER — Other Ambulatory Visit (INDEPENDENT_AMBULATORY_CARE_PROVIDER_SITE_OTHER): Payer: Medicare Other

## 2013-02-21 DIAGNOSIS — E785 Hyperlipidemia, unspecified: Secondary | ICD-10-CM

## 2013-02-21 LAB — LIPID PANEL
Cholesterol: 160 mg/dL (ref 0–200)
HDL: 39.6 mg/dL (ref >39.00)
LDL Cholesterol: 105 mg/dL — ABNORMAL HIGH (ref 0–99)
Total CHOL/HDL Ratio: 4
Triglycerides: 77 mg/dL (ref 0.0–149.0)
VLDL: 15.4 mg/dL (ref 0.0–40.0)

## 2013-02-21 LAB — HEPATIC FUNCTION PANEL
ALT: 25 U/L (ref 0–53)
AST: 24 U/L (ref 0–37)
Albumin: 3.7 g/dL (ref 3.5–5.2)
Alkaline Phosphatase: 40 U/L (ref 39–117)
Bilirubin, Direct: 0.1 mg/dL (ref 0.0–0.3)
Total Bilirubin: 1.1 mg/dL (ref 0.3–1.2)
Total Protein: 6.8 g/dL (ref 6.0–8.3)

## 2013-02-22 ENCOUNTER — Ambulatory Visit (INDEPENDENT_AMBULATORY_CARE_PROVIDER_SITE_OTHER): Payer: Medicare Other | Admitting: Pharmacist

## 2013-02-22 DIAGNOSIS — E785 Hyperlipidemia, unspecified: Secondary | ICD-10-CM

## 2013-02-22 NOTE — Patient Instructions (Signed)
Continue Lipitor 80mg  once daily  Continue diet and exercise routines   Follow up with Dr. Gala Romney

## 2013-02-23 NOTE — Progress Notes (Signed)
HPI Mr [name] is a pleasant 71yom referred to Lipid Clinic by cardiologist Dr. Gala Romney. He is seen today for 4 month follow up.   He was previously in Lipid Clinic with Shelby Dubin Pharm.D. but he  stopped coming.  He states hx inc LFT with Lipitor 80mg  and therefore it was stopped by primary MD.  However, he was also taking other medications for cholesterol including Zetia, and Niaspan at that time.  He was reinitiated on Lipitor 40mh about 40yr ago with no increases in LFTs. At last visit, this was increased to 80mg  once daily.  He has not had any pains related to increase in Lipitor.  He does have pain in his hips but relates this to need to have it replaced.   Hx  CAD with multiple stents and CABG x4 1 yr ago Hyperlipidemia EF 65%  Diet Breakfast - bran cereal, raisins, skim milk and 1/2 tsp sugar most days a week and 1 egg on english muffin with tomato and tabasco 1 day week Lunch is homemade or low salt canned soup Dinner - lean grilled meat or fish with potatoes or potato salad or pasta and steamed veggies - portions are large for evening meal Occasional snacks  Exercise 2-4 days/week strength exercises at the [facility] and 15-52min stationary bike.  He reports increase in yard work the past few weeks as well.   Current Outpatient Prescriptions  Medication Sig Dispense Refill  . acetaminophen (TYLENOL) 500 MG tablet Take 1,000 mg by mouth daily as needed.        Marland Kitchen amLODipine (NORVASC) 10 MG tablet Take 1 tablet (10 mg total) by mouth daily.  30 tablet  6  . amoxicillin (AMOXIL) 500 MG capsule Take 500 mg by mouth 3 (three) times daily. PRIOR TO DENTAL APPOINTMENT       . aspirin 81 MG tablet Take 81 mg by mouth daily.      Marland Kitchen atorvastatin (LIPITOR) 80 MG tablet Take 1 tablet (80 mg total) by mouth daily.  30 tablet  6  . B Complex Vitamins (VITAMIN-B COMPLEX PO) Take by mouth. 1 tab daily       . Glucosamine HCl 1000 MG TABS Take 1 tablet by mouth daily.        Marland Kitchen losartan (COZAAR) 100 MG  tablet TAKE 1 TABLET ONCE DAILY.  30 tablet  12  . metoprolol (LOPRESSOR) 50 MG tablet TAKE 1 TABLET TWICE DAILY.  60 tablet  5  . Omega-3 Fatty Acids (FISH OIL DOUBLE STRENGTH) 1200 MG CAPS Take 2 capsules by mouth daily.        . Tamsulosin HCl (FLOMAX) 0.4 MG CAPS Take 0.4 mg by mouth daily. Alternating 1-2 tablets daily        No current facility-administered medications for this visit.   Allergies  Allergen Reactions  . Morphine Nausea Only

## 2013-02-23 NOTE — Assessment & Plan Note (Signed)
Reviewed pt's labs.  TC- 160, TG- 77, HDL- 39, LDL- 105.  LFTs are WNL.  Pt's LDL marginally better with 80mg  rather than 40mg  of Lipitor.  Still not at traditional goal of <70, but based on most current guidelines, pt is on high dose statin so no need to push closer to <70 goal with additional therapies.  In the past, when he has added other medications, LFTs have also increased.  Discussed with pt.  Will continue with Lipitor 80mg  only now rather than add back niaspan and/or zetia.  Will have him follow up with Dr. Gala Romney.

## 2013-03-08 ENCOUNTER — Encounter: Payer: Self-pay | Admitting: Cardiovascular Disease

## 2013-03-08 NOTE — Telephone Encounter (Signed)
To LB

## 2013-03-08 NOTE — Telephone Encounter (Signed)
This encounter was created in error - please disregard.

## 2013-03-08 NOTE — Telephone Encounter (Signed)
New Problem: ° ° ° °Patient called in returning a call.  Please call back. °

## 2013-03-09 ENCOUNTER — Encounter (HOSPITAL_COMMUNITY): Payer: Self-pay

## 2013-03-09 ENCOUNTER — Encounter (HOSPITAL_COMMUNITY): Payer: Medicare Other

## 2013-03-09 ENCOUNTER — Ambulatory Visit (HOSPITAL_COMMUNITY)
Admission: RE | Admit: 2013-03-09 | Discharge: 2013-03-09 | Disposition: A | Discharge status: Home / Self Care | Payer: Medicare Other | Source: Ambulatory Visit | Attending: Internal Medicine | Admitting: Internal Medicine

## 2013-03-09 VITALS — BP 130/58 | HR 54 | Wt 202.1 lb

## 2013-03-09 DIAGNOSIS — M79609 Pain in unspecified limb: Secondary | ICD-10-CM | POA: Insufficient documentation

## 2013-03-09 DIAGNOSIS — Z79899 Other long term (current) drug therapy: Secondary | ICD-10-CM | POA: Insufficient documentation

## 2013-03-09 DIAGNOSIS — E785 Hyperlipidemia, unspecified: Secondary | ICD-10-CM | POA: Insufficient documentation

## 2013-03-09 DIAGNOSIS — Z951 Presence of aortocoronary bypass graft: Secondary | ICD-10-CM | POA: Insufficient documentation

## 2013-03-09 DIAGNOSIS — I658 Occlusion and stenosis of other precerebral arteries: Secondary | ICD-10-CM

## 2013-03-09 DIAGNOSIS — Z9861 Coronary angioplasty status: Secondary | ICD-10-CM | POA: Insufficient documentation

## 2013-03-09 DIAGNOSIS — Z7982 Long term (current) use of aspirin: Secondary | ICD-10-CM | POA: Insufficient documentation

## 2013-03-09 DIAGNOSIS — I6529 Occlusion and stenosis of unspecified carotid artery: Secondary | ICD-10-CM

## 2013-03-09 DIAGNOSIS — I6523 Occlusion and stenosis of bilateral carotid arteries: Secondary | ICD-10-CM

## 2013-03-09 DIAGNOSIS — I10 Essential (primary) hypertension: Secondary | ICD-10-CM | POA: Insufficient documentation

## 2013-03-09 DIAGNOSIS — I2510 Atherosclerotic heart disease of native coronary artery without angina pectoris: Secondary | ICD-10-CM | POA: Insufficient documentation

## 2013-03-09 MED ORDER — METOPROLOL TARTRATE 50 MG PO TABS: ORAL_TABLET | ORAL | Status: DC

## 2013-03-09 NOTE — Assessment & Plan Note (Signed)
Stable. Due for repeat in 9/14.

## 2013-03-09 NOTE — Assessment & Plan Note (Signed)
BP is better. He will continue to follow. If SBP creeps up over 140 will need to titrate meds.

## 2013-03-09 NOTE — Assessment & Plan Note (Signed)
Much improved. Continue atorvastatin. Recheck in 6 months.

## 2013-03-09 NOTE — Progress Notes (Signed)
Patient ID: [name], male   DOB: 1941/01/31, 72 y.o.   MRN: [id number]  PCP: Dr Rosezetta Schlatter  HPI:  [name] is a very pleasant 72 year old male previously followed by Dr. Graceann Congress.  He has a history of CAD, multiple angioplasties to the RCA dating back to 1983, HTN, and hyperlipidemia and is followed by [facility].  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  He was hospitalized in 11/11 because of recurrent chest pain. He underwent drug-eluting stent to the proximal RCA July 24, 2010. Ejection fraction was 65%. He also had 99% stenosis in the distal PDA with left to right collaterals and nonobstructive disease in the left coronary tree.  Admitted in October 2012 with [place]. Found to have progressive CAD. Underwent Coronary artery bypass grafting x4 (left internal mammary artery to LAD, saphenous vein graft to circumflex marginal, saphenous vein graft to posterior descending branch of the distal right coronary, saphenous vein graft to the posterolateral branch of the distal right coronary artery).   Carotid u/s 2/12: 40-59% bilat (stable) Cartoid u/s 3/12: LICA 60-79%, RICA 40-59%  Carotid u/s 9/13 40-59% bilaterally  11/14/12 ABI This led to a noninvasive lower extremity arterial study. This demonstrated noncompressible tibial arteries. His ABIs were 1.1 on the right and 1.0 on the left. His toe brachial indices were 0.77 on the right and 0.63 on the left. Waveforms were triphasic. Seen by Dr. Excell Seltzer and felt he was OK.    Previously suggested to increased Atorvastatin to 80 mg daily but previously had elevated LFTs on 80mg  daily. Has tried Crestor in past and felt it was not effective (numbers went up). Was seen in [facility] and niaspan added to help bring numbers down.  Stopped Niaspan after AIM-HIGH trial. 02/23/13 Evaluated at [facility] TC- 160, TG- 77, HDL- 39, LDL- 105. LFTs are WNL.Better on lipitor 80 mg daily.   He returns  for follow up. Complains of pain on the bottom of his foot. Denies SOB/CP. He attends the [facility] times a week. SBP at home 130-140. Saw Dr. Excell Seltzer recently who suggested addition of HCTZ.  SBP recently 130-140/60.    Lab Results  Component Value Date   CHOL 160 02/21/2013   HDL 39.60 02/21/2013   LDLCALC 105* 02/21/2013   TRIG 77.0 02/21/2013   CHOLHDL 4 02/21/2013    ROS: All systems negative except as listed in HPI, PMH and Problem List.  Past Medical History  Diagnosis Date  . CAD (coronary artery disease)     s/p multiple PCIs to RCA dating back to 30. Last stent to proximal RCA in November of 2011. Also had 99% stenosis in the distal PDA with left to right collaterals and nonobstructive disease in the left coronary tree. ;  ISR of RCA 10/12 - CABG 10/9 with Dr. Zenaida Niece Trigt: L-LAD, S-OM, S-PDA  . HTN (hypertension)   . Hyperlipidemia   . 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 (stable)    Current Outpatient Prescriptions  Medication Sig Dispense Refill  . acetaminophen (TYLENOL) 500 MG tablet Take 1,000 mg by mouth daily as needed.        Marland Kitchen amLODipine (NORVASC) 10 MG tablet Take 1 tablet (10 mg total) by mouth daily.  30 tablet  6  . amoxicillin (AMOXIL) 500 MG capsule Take 500 mg by mouth 3 (three) times daily. PRIOR TO DENTAL APPOINTMENT       .  aspirin 81 MG tablet Take 81 mg by mouth daily.      Marland Kitchen atorvastatin (LIPITOR) 80 MG tablet Take 1 tablet (80 mg total) by mouth daily.  30 tablet  6  . B Complex Vitamins (VITAMIN-B COMPLEX PO) Take by mouth. 1 tab daily       . Glucosamine HCl 1000 MG TABS Take 1 tablet by mouth daily.        Marland Kitchen losartan (COZAAR) 100 MG tablet TAKE 1 TABLET ONCE DAILY.  30 tablet  12  . metoprolol (LOPRESSOR) 50 MG tablet TAKE 1 TABLET TWICE DAILY.  60 tablet  5  . Omega-3 Fatty Acids (FISH OIL DOUBLE STRENGTH) 1200 MG CAPS Take 2 capsules by mouth daily.        . Tamsulosin HCl (FLOMAX) 0.4 MG CAPS Take 0.4 mg by mouth daily. Alternating  1-2 tablets daily        No current facility-administered medications for this encounter.     PHYSICAL EXAM: Filed Vitals:   03/09/13 0929  BP: 130/58  Pulse: 54  Weight: 202 lb 1.9 oz (91.681 kg)  SpO2: 98%    General:  Well appearing. No resp difficulty HEENT: normal Neck: supple. JVP flat. Carotids 2+ bilaterally; no bruits. No lymphadenopathy or thryomegaly appreciated. Cor: sternal scar   PMI normal. Regular rate & rhythm. No rubs, gallops or murmurs. Lungs: clear Abdomen: soft, nontender, nondistended. No hepatosplenomegaly. No bruits or masses. Good bowel sounds. Extremities: no cyanosis, clubbing, rash, tr edema. DP 1+ R nonpalpable on L Neuro: alert & orientedx3, cranial nerves grossly intact. Moves all 4 extremities w/o difficulty. Affect pleasant.    ASSESSMENT & PLAN:

## 2013-03-09 NOTE — Addendum Note (Signed)
Encounter addended by: Noralee Space, RN on: 03/09/2013 10:08 AM<BR>     Documentation filed: Patient Instructions Section, Orders

## 2013-03-09 NOTE — Patient Instructions (Addendum)
Your physician has requested that you have a carotid duplex. This test is an ultrasound of the carotid arteries in your neck. It looks at blood flow through these arteries that supply the brain with blood. Allow one hour for this exam. There are no restrictions or special instructions.  IN SEPTEMBER  Follow up in 6 months

## 2013-03-09 NOTE — Assessment & Plan Note (Signed)
No evidence of ischemia. Continue current regimen.   

## 2013-04-29 ENCOUNTER — Encounter: Payer: Self-pay | Admitting: Internal Medicine

## 2013-05-07 ENCOUNTER — Encounter: Payer: Self-pay | Admitting: Internal Medicine

## 2013-05-29 ENCOUNTER — Encounter (INDEPENDENT_AMBULATORY_CARE_PROVIDER_SITE_OTHER): Payer: Medicare Other

## 2013-05-29 DIAGNOSIS — I6529 Occlusion and stenosis of unspecified carotid artery: Secondary | ICD-10-CM

## 2013-06-10 ENCOUNTER — Other Ambulatory Visit: Payer: Self-pay | Admitting: Internal Medicine

## 2013-06-13 HISTORY — PX: TOOTH EXTRACTION: SUR596

## 2013-06-23 ENCOUNTER — Other Ambulatory Visit: Payer: Self-pay | Admitting: Internal Medicine

## 2013-07-06 ENCOUNTER — Ambulatory Visit (AMBULATORY_SURGERY_CENTER): Payer: Self-pay | Admitting: *Deleted

## 2013-07-06 VITALS — Ht 67.0 in | Wt 207.2 lb

## 2013-07-06 DIAGNOSIS — Z1211 Encounter for screening for malignant neoplasm of colon: Secondary | ICD-10-CM

## 2013-07-06 MED ORDER — MOVIPREP 100 G PO SOLR: ORAL | Status: DC

## 2013-07-06 NOTE — Progress Notes (Signed)
No allergies to eggs or soy. No problems with anesthesia.  No oxygen use 

## 2013-07-20 ENCOUNTER — Encounter: Payer: Medicare Other | Admitting: Internal Medicine

## 2013-07-24 ENCOUNTER — Encounter: Payer: Medicare Other | Admitting: Internal Medicine

## 2013-08-03 ENCOUNTER — Ambulatory Visit (AMBULATORY_SURGERY_CENTER): Payer: Medicare Other | Admitting: Internal Medicine

## 2013-08-03 ENCOUNTER — Encounter: Payer: Self-pay | Admitting: Internal Medicine

## 2013-08-03 VITALS — BP 150/66 | HR 53 | Temp 96.9°F | Resp 29 | Ht 67.0 in | Wt 207.0 lb

## 2013-08-03 DIAGNOSIS — Z1211 Encounter for screening for malignant neoplasm of colon: Secondary | ICD-10-CM

## 2013-08-03 MED ORDER — SODIUM CHLORIDE 0.9 % IV SOLN: 500.0000 mL | INTRAVENOUS | Status: DC

## 2013-08-03 NOTE — Progress Notes (Signed)
Report to pacu rn, vss, bbs=clear 

## 2013-08-03 NOTE — Progress Notes (Signed)
Patient did not experience any of the following events: a burn prior to discharge; a fall within the facility; wrong site/side/patient/procedure/implant event; or a hospital transfer or hospital admission upon discharge from the facility. (G8907) Patient did not have preoperative order for IV antibiotic SSI prophylaxis. (G8918)  

## 2013-08-03 NOTE — Patient Instructions (Signed)
YOU HAD AN ENDOSCOPIC PROCEDURE TODAY AT THE [facility]: Refer to the procedure report that was given to you for any specific questions about what was found during the examination.  If the procedure report does not answer your questions, please call your gastroenterologist to clarify.  If you requested that your care partner not be given the details of your procedure findings, then the procedure report has been included in a sealed envelope for you to review at your convenience later.  YOU SHOULD EXPECT: Some feelings of bloating in the abdomen. Passage of more gas than usual.  Walking can help get rid of the air that was put into your GI tract during the procedure and reduce the bloating. If you had a lower endoscopy (such as a colonoscopy or flexible sigmoidoscopy) you may notice spotting of blood in your stool or on the toilet paper. If you underwent a bowel prep for your procedure, then you may not have a normal bowel movement for a few days.  DIET: Your first meal following the procedure should be a light meal and then it is ok to progress to your normal diet.  A half-sandwich or bowl of soup is an example of a good first meal.  Heavy or fried foods are harder to digest and may make you feel nauseous or bloated.  Likewise meals heavy in dairy and vegetables can cause extra gas to form and this can also increase the bloating.  Drink plenty of fluids but you should avoid alcoholic beverages for 24 hours.  ACTIVITY: Your care partner should take you home directly after the procedure.  You should plan to take it easy, moving slowly for the rest of the day.  You can resume normal activity the day after the procedure however you should NOT DRIVE or use heavy machinery for 24 hours (because of the sedation medicines used during the test).    SYMPTOMS TO REPORT IMMEDIATELY: A gastroenterologist can be reached at any hour.  During normal business hours, 8:30 AM to 5:00 PM Monday through Friday,  call [phone].  After hours and on weekends, please call the GI answering service at [phone] who will take a message and have the physician on call contact you.   Following lower endoscopy (colonoscopy or flexible sigmoidoscopy):  Excessive amounts of blood in the stool  Significant tenderness or worsening of abdominal pains  Swelling of the abdomen that is new, acute  Fever of 100F or higher l  FOLLOW UP: If any biopsies were taken you will be contacted by phone or by letter within the next 1-3 weeks.  Call your gastroenterologist if you have not heard about the biopsies in 3 weeks.  Our staff will call the home number listed on your records the next business day following your procedure to check on you and address any questions or concerns that you may have at that time regarding the information given to you following your procedure. This is a courtesy call and so if there is no answer at the home number and we have not heard from you through the emergency physician on call, we will assume that you have returned to your regular daily activities without incident.  SIGNATURES/CONFIDENTIALITY: You and/or your care partner have signed paperwork which will be entered into your electronic medical record.  These signatures attest to the fact that that the information above on your After Visit Summary has been reviewed and is understood.  Full responsibility of the confidentiality of  this discharge information lies with you and/or your care-partner.   Diverticulosis and high fiber diet information given.  Next colonscopy 10 years-2024

## 2013-08-03 NOTE — Op Note (Signed)
[facility] [address] [place], [address]   COLONOSCOPY PROCEDURE REPORT  PATIENT: [name], [name]  MR#: [id number] BIRTHDATE: March 19, 1941 , 72  yrs. old GENDER: Male ENDOSCOPIST: Hart Carwin, MD REFERRED XB:JYNWG Doristine Counter, M.D. PROCEDURE DATE:  08/03/2013 PROCEDURE:   Colonoscopy, screening First Screening Colonoscopy - Avg.  risk and is 50 yrs.  old or older - No.  Prior Negative Screening - Now for repeat screening. 10 or more years since last screening  History of Adenoma - Now for follow-up colonoscopy & has been > or = to 3 yrs.  N/A  Polyps Removed Today? No.  Recommend repeat exam, <10 yrs? No. ASA CLASS:   Class II INDICATIONS:llast colonoscopy in November 2004 showed mild diverticulosis. MEDICATIONS: MAC sedation, administered by CRNA and Propofol (Diprivan) 230 mg IV  DESCRIPTION OF PROCEDURE:   After the risks benefits and alternatives of the procedure were thoroughly explained, informed consent was obtained.  A digital rectal exam revealed no abnormalities of the rectum.   The LB [id number]  endoscope was introduced through the anus and advanced to the cecum, which was identified by both the appendix and ileocecal valve. No adverse events experienced.   The quality of the prep was Prepopik excellent  The instrument was then slowly withdrawn as the colon was fully examined.      COLON FINDINGS: There was mild diverticulosis noted in the sigmoid colon with associated muscular hypertrophy.  Retroflexed views revealed no abnormalities. The time to cecum=2 minutes 33 seconds. Withdrawal time=9 minutes 20 seconds.  The scope was withdrawn and the procedure completed. COMPLICATIONS: There were no complications.  ENDOSCOPIC IMPRESSION: There was mild diverticulosis noted in the sigmoid colon  RECOMMENDATIONS: 1.  High fiber diet 2.   recall colonoscopy in 10 years   eSigned:  Hart Carwin, MD 08/03/2013 10:40  AM   cc:   PATIENT NAME:  [name], [name] MR#: [id number]

## 2013-08-06 ENCOUNTER — Telehealth: Payer: Self-pay | Admitting: *Deleted

## 2013-08-06 NOTE — Telephone Encounter (Signed)
Left message to call office if questions or concerns. 

## 2013-08-23 ENCOUNTER — Other Ambulatory Visit: Payer: Self-pay | Admitting: Cardiovascular Disease

## 2013-10-16 ENCOUNTER — Other Ambulatory Visit: Payer: Self-pay | Admitting: Internal Medicine

## 2014-01-25 ENCOUNTER — Other Ambulatory Visit: Payer: Self-pay | Admitting: Internal Medicine

## 2014-02-26 ENCOUNTER — Ambulatory Visit (HOSPITAL_COMMUNITY): Payer: Medicare Other

## 2014-02-27 ENCOUNTER — Other Ambulatory Visit: Payer: Self-pay | Admitting: Internal Medicine

## 2014-03-18 ENCOUNTER — Ambulatory Visit (HOSPITAL_COMMUNITY)
Admission: RE | Admit: 2014-03-18 | Discharge: 2014-03-18 | Disposition: A | Discharge status: Home / Self Care | Payer: Medicare Other | Source: Ambulatory Visit | Attending: Internal Medicine | Admitting: Internal Medicine

## 2014-03-18 ENCOUNTER — Encounter (HOSPITAL_COMMUNITY): Payer: Self-pay

## 2014-03-18 VITALS — BP 128/54 | HR 49 | Wt 203.1 lb

## 2014-03-18 DIAGNOSIS — I6529 Occlusion and stenosis of unspecified carotid artery: Secondary | ICD-10-CM | POA: Diagnosis not present

## 2014-03-18 DIAGNOSIS — I10 Essential (primary) hypertension: Secondary | ICD-10-CM | POA: Insufficient documentation

## 2014-03-18 DIAGNOSIS — E785 Hyperlipidemia, unspecified: Secondary | ICD-10-CM | POA: Diagnosis not present

## 2014-03-18 DIAGNOSIS — I2510 Atherosclerotic heart disease of native coronary artery without angina pectoris: Secondary | ICD-10-CM | POA: Diagnosis present

## 2014-03-18 DIAGNOSIS — I658 Occlusion and stenosis of other precerebral arteries: Secondary | ICD-10-CM

## 2014-03-18 DIAGNOSIS — I6523 Occlusion and stenosis of bilateral carotid arteries: Secondary | ICD-10-CM

## 2014-03-18 DIAGNOSIS — I25119 Atherosclerotic heart disease of native coronary artery with unspecified angina pectoris: Secondary | ICD-10-CM

## 2014-03-18 DIAGNOSIS — I209 Angina pectoris, unspecified: Secondary | ICD-10-CM

## 2014-03-18 NOTE — Addendum Note (Signed)
Encounter addended by: Noralee SpaceHeather M Kellsey Sansone, RN on: 03/18/2014 12:52 PM<BR>     Documentation filed: Visit Diagnoses, Orders, Patient Instructions Section, Medications

## 2014-03-18 NOTE — Patient Instructions (Signed)
Stop Metoprolol   Fasting Labs Tuesday 7/14  Your physician has requested that you have a carotid duplex. This test is an ultrasound of the carotid arteries in your neck. It looks at blood flow through these arteries that supply the brain with blood. Allow one hour for this exam. There are no restrictions or special instructions.  We will contact you in 1 year to schedule your next appointment.

## 2014-03-18 NOTE — Addendum Note (Signed)
Encounter addended by: Dolores Pattyaniel R Draper Gallon, MD on: 03/18/2014  3:09 PM<BR>     Documentation filed: Notes Section

## 2014-03-18 NOTE — Progress Notes (Addendum)
Patient ID: Carmelina DaneGeorge A Torrico, male   DOB: 03/31/1941, 73 y.o.   MRN: [id number]  PCP: Dr Rosezetta SchlatterBurnette  HPI:  Greggory StallionGeorge is a very pleasant 73 year old male previously followed by Dr. Graceann CongressJoseph Winthrop.  He has a history of CAD, multiple angioplasties to the RCA dating back to 1983 followed by CABG in 2012, HTN, and hyperlipidemia.  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  He was hospitalized in 11/11 because of recurrent chest pain. He underwent drug-eluting stent to the proximal RCA July 24, 2010. Ejection fraction was 65%. He also had 99% stenosis in the distal PDA with left to right collaterals and nonobstructive disease in the left coronary tree.  Ad[place] in October 2012 [place]. Found to have progressive CAD. Underwent Coronary artery bypass grafting x4 (left internal mammary artery to LAD, saphenous vein graft to circumflex marginal, saphenous vein graft to posterior descending branch of the distal right coronary, saphenous vein graft to the posterolateral branch of the distal right coronary artery).   Carotid u/s 2/12: 40-59% bilat (stable) Cartoid u/s 3/12: LICA 60-79%, RICA 40-59%  Carotid u/s 9/13 40-59% bilaterally  11/14/12 ABI This led to a noninvasive lower extremity arterial study. This demonstrated noncompressible tibial arteries. His ABIs were 1.1 on the right and 1.0 on the left. His toe brachial indices were 0.77 on the right and 0.63 on the left. Waveforms were triphasic. Seen by Dr. Excell Seltzerooper and felt he was OK.   Previously suggested to increased Atorvastatin to 80 mg daily but previously had elevated LFTs on 80mg  daily. Has tried Crestor in past and felt it was not effective (numbers went up). Was seen in [facility] and niaspan added to help bring numbers down.  Stopped Niaspan after AIM-HIGH trial. 02/23/13 Evaluated at [facility]id Clinic TC- 160, TG- 77, HDL- 39, LDL- 105. LFTs are WNL. Better on lipitor 80 mg daily. No longer following in Lipid  Clinic  He returns for yearly follow up. [facility].Walks on TM for 10 min before foot begins to hurt. Does weights and situps. Denies SOB/CP.  SBP at home 130s. Lopressor makes him feel very fatigued.    Lab Results  Component Value Date   CHOL 160 02/21/2013   HDL 39.60 02/21/2013   LDLCALC 105* 02/21/2013   TRIG 77.0 02/21/2013   CHOLHDL 4 02/21/2013    ROS: All systems negative except as listed in HPI, PMH and Problem List.  Past Medical History  Diagnosis Date  . CAD (coronary artery disease)     s/p multiple PCIs to RCA dating back to 321983. Last stent to proximal RCA in November of 2011. Also had 99% stenosis in the distal PDA with left to right collaterals and nonobstructive disease in the left coronary tree. ;  ISR of RCA 10/12 - CABG 10/9 with Dr. Zenaida NieceVan Trigt: L-LAD, S-OM, S-PDA  . HTN (hypertension)   . Hyperlipidemia   . 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 (stable)    Current Outpatient Prescriptions  Medication Sig Dispense Refill  . amLODipine (NORVASC) 10 MG tablet TAKE 1 TABLET ONCE DAILY.  30 tablet  6  . aspirin 81 MG tablet Take 81 mg by mouth daily.      Marland Kitchen. atorvastatin (LIPITOR) 80 MG tablet TAKE 1 TABLET ONCE DAILY.  30 tablet  6  . B Complex Vitamins (VITAMIN-B COMPLEX PO) Take by mouth. 1 tab daily       .  Glucosamine HCl 1000 MG TABS Take 1 tablet by mouth daily.        Marland Kitchen. ibuprofen (ADVIL,MOTRIN) 400 MG tablet Take 400 mg by mouth every 6 (six) hours as needed for pain.      Marland Kitchen. losartan (COZAAR) 100 MG tablet TAKE 1 TABLET ONCE DAILY.  30 tablet  6  . metoprolol (LOPRESSOR) 50 MG tablet TAKE 1 TABLET TWICE DAILY.  60 tablet  5  . Omega-3 Fatty Acids (FISH OIL DOUBLE STRENGTH) 1200 MG CAPS Take 2 capsules by mouth daily.        . Tamsulosin HCl (FLOMAX) 0.4 MG CAPS Take 0.4 mg by mouth daily. Alternating 1-2 tablets daily        No current facility-administered medications for this encounter.     PHYSICAL EXAM: Filed Vitals:    03/18/14 1223  BP: 128/54  Pulse: 49  Weight: 203 lb 1.6 oz (92.126 kg)    General:  Well appearing. No resp difficulty HEENT: normal Neck: supple. JVP flat. Carotids 2+ bilaterally; no bruits. No lymphadenopathy or thryomegaly appreciated. Cor: sternal scar   PMI normal. Huston FoleyBrady Regular rate & rhythm. No rubs, gallops or murmurs. Lungs: clear Abdomen: soft, nontender, nondistended. No hepatosplenomegaly. No bruits or masses. Good bowel sounds. Extremities: no cyanosis, clubbing, rash, tr edema. Neuro: alert & orientedx3, cranial nerves grossly intact. Moves all 4 extremities w/o difficulty. Affect pleasant.    ASSESSMENT & PLAN: 1. CAD      --No evidence of ischemia. Continue exercise, ASA and statin. Stop b-blocker due to bradycardia/fatigue 2. HTN   --BP upper end of normal. Will stop lopressor due to symptomatic bradycardia. If BP climbs >140  will need to consider changing losartan to Benicar. Or Flomax to doxazosin. 3. Carotid stenosis   --asx. Due for repeat scan 4. HL   --repeat lipids/CMET. Continue atorva 80  Gittel Mccamish,MD 12:32 PM  ECG: Sinus brady 47. No ST-T wave abnormalities.   Amantha Sklar,MD 3:08 PM

## 2014-03-26 ENCOUNTER — Other Ambulatory Visit (INDEPENDENT_AMBULATORY_CARE_PROVIDER_SITE_OTHER): Payer: Medicare Other

## 2014-03-26 ENCOUNTER — Ambulatory Visit (HOSPITAL_COMMUNITY): Payer: Medicare Other | Attending: Cardiology | Admitting: *Deleted

## 2014-03-26 DIAGNOSIS — E785 Hyperlipidemia, unspecified: Secondary | ICD-10-CM | POA: Insufficient documentation

## 2014-03-26 DIAGNOSIS — I25119 Atherosclerotic heart disease of native coronary artery with unspecified angina pectoris: Secondary | ICD-10-CM

## 2014-03-26 DIAGNOSIS — I10 Essential (primary) hypertension: Secondary | ICD-10-CM

## 2014-03-26 DIAGNOSIS — I658 Occlusion and stenosis of other precerebral arteries: Secondary | ICD-10-CM | POA: Insufficient documentation

## 2014-03-26 DIAGNOSIS — R0989 Other specified symptoms and signs involving the circulatory and respiratory systems: Secondary | ICD-10-CM | POA: Diagnosis not present

## 2014-03-26 DIAGNOSIS — I6529 Occlusion and stenosis of unspecified carotid artery: Secondary | ICD-10-CM | POA: Insufficient documentation

## 2014-03-26 DIAGNOSIS — I209 Angina pectoris, unspecified: Secondary | ICD-10-CM

## 2014-03-26 DIAGNOSIS — I2510 Atherosclerotic heart disease of native coronary artery without angina pectoris: Secondary | ICD-10-CM | POA: Diagnosis not present

## 2014-03-26 DIAGNOSIS — I6523 Occlusion and stenosis of bilateral carotid arteries: Secondary | ICD-10-CM

## 2014-03-26 LAB — BASIC METABOLIC PANEL
BUN: 12 mg/dL (ref 6–23)
CO2: 26 mEq/L (ref 19–32)
Calcium: 9.4 mg/dL (ref 8.4–10.5)
Chloride: 108 mEq/L (ref 96–112)
Creatinine, Ser: 0.8 mg/dL (ref 0.4–1.5)
GFR: 98.38 mL/min (ref >60.00)
Glucose, Bld: 100 mg/dL — ABNORMAL HIGH (ref 70–99)
Potassium: 4 mEq/L (ref 3.5–5.1)
Sodium: 141 mEq/L (ref 135–145)

## 2014-03-26 LAB — LIPID PANEL
Cholesterol: 168 mg/dL (ref 0–200)
HDL: 57 mg/dL (ref >39.00)
LDL Cholesterol: 97 mg/dL (ref 0–99)
NonHDL: 111
Total CHOL/HDL Ratio: 3
Triglycerides: 70 mg/dL (ref 0.0–149.0)
VLDL: 14 mg/dL (ref 0.0–40.0)

## 2014-03-26 LAB — HEPATIC FUNCTION PANEL
ALT: 25 U/L (ref 0–53)
AST: 32 U/L (ref 0–37)
Albumin: 4 g/dL (ref 3.5–5.2)
Alkaline Phosphatase: 44 U/L (ref 39–117)
Bilirubin, Direct: 0 mg/dL (ref 0.0–0.3)
Total Bilirubin: 1.1 mg/dL (ref 0.2–1.2)
Total Protein: 6.9 g/dL (ref 6.0–8.3)

## 2014-03-26 NOTE — Progress Notes (Signed)
Carotid duplex complete 

## 2014-05-08 ENCOUNTER — Telehealth (HOSPITAL_COMMUNITY): Payer: Self-pay | Admitting: *Deleted

## 2014-05-08 NOTE — Telephone Encounter (Signed)
Pt given lab and carotid results, placed in recall for carotid in Jan

## 2014-05-08 NOTE — Telephone Encounter (Signed)
Message copied by Noralee Space on Wed May 08, 2014  1:53 PM ------      Message from: Arvilla Meres R      Created: Thu Apr 25, 2014  4:17 PM       LDL 97. On atorva 80. Can consider adding zetia at next visit if remains > 70 ------

## 2014-05-30 ENCOUNTER — Other Ambulatory Visit: Payer: Self-pay | Admitting: Internal Medicine

## 2014-08-30 ENCOUNTER — Other Ambulatory Visit (HOSPITAL_COMMUNITY): Payer: Self-pay | Admitting: Internal Medicine

## 2014-09-26 ENCOUNTER — Telehealth (HOSPITAL_COMMUNITY): Payer: Self-pay | Admitting: *Deleted

## 2014-09-26 DIAGNOSIS — I6523 Occlusion and stenosis of bilateral carotid arteries: Secondary | ICD-10-CM

## 2014-09-26 NOTE — Telephone Encounter (Signed)
Pt is due for 6 months carotids, [facility]

## 2014-09-30 ENCOUNTER — Other Ambulatory Visit (HOSPITAL_COMMUNITY): Payer: Self-pay

## 2014-09-30 MED ORDER — LOSARTAN POTASSIUM 100 MG PO TABS: 100.0000 mg | ORAL_TABLET | Freq: Every day | ORAL | Status: DC

## 2014-10-08 ENCOUNTER — Ambulatory Visit (HOSPITAL_COMMUNITY): Payer: Medicare Other | Attending: Cardiology | Admitting: Cardiology

## 2014-10-08 DIAGNOSIS — I6523 Occlusion and stenosis of bilateral carotid arteries: Secondary | ICD-10-CM | POA: Insufficient documentation

## 2014-10-08 NOTE — Progress Notes (Signed)
Carotid duplex performed 

## 2014-11-26 ENCOUNTER — Other Ambulatory Visit (HOSPITAL_COMMUNITY): Payer: Self-pay | Admitting: *Deleted

## 2014-11-26 MED ORDER — LOSARTAN POTASSIUM 100 MG PO TABS: 100.0000 mg | ORAL_TABLET | Freq: Every day | ORAL | Status: DC

## 2014-11-26 MED ORDER — ATORVASTATIN CALCIUM 80 MG PO TABS: 80.0000 mg | ORAL_TABLET | Freq: Every day | ORAL | Status: DC

## 2014-12-26 ENCOUNTER — Other Ambulatory Visit (HOSPITAL_COMMUNITY): Payer: Self-pay | Admitting: Internal Medicine

## 2014-12-26 DIAGNOSIS — I5022 Chronic systolic (congestive) heart failure: Secondary | ICD-10-CM

## 2015-01-26 ENCOUNTER — Other Ambulatory Visit (HOSPITAL_COMMUNITY): Payer: Self-pay | Admitting: Internal Medicine

## 2015-04-01 ENCOUNTER — Ambulatory Visit (HOSPITAL_COMMUNITY)
Admission: RE | Admit: 2015-04-01 | Discharge: 2015-04-01 | Disposition: A | Discharge status: Home / Self Care | Payer: Medicare Other | Source: Ambulatory Visit | Attending: Internal Medicine | Admitting: Internal Medicine

## 2015-04-01 ENCOUNTER — Encounter (HOSPITAL_COMMUNITY): Payer: Self-pay

## 2015-04-01 VITALS — BP 130/62 | HR 73 | Wt 196.5 lb

## 2015-04-01 DIAGNOSIS — Z955 Presence of coronary angioplasty implant and graft: Secondary | ICD-10-CM | POA: Insufficient documentation

## 2015-04-01 DIAGNOSIS — Z7982 Long term (current) use of aspirin: Secondary | ICD-10-CM | POA: Insufficient documentation

## 2015-04-01 DIAGNOSIS — I4510 Unspecified right bundle-branch block: Secondary | ICD-10-CM | POA: Insufficient documentation

## 2015-04-01 DIAGNOSIS — Z79899 Other long term (current) drug therapy: Secondary | ICD-10-CM | POA: Insufficient documentation

## 2015-04-01 DIAGNOSIS — E785 Hyperlipidemia, unspecified: Secondary | ICD-10-CM | POA: Diagnosis not present

## 2015-04-01 DIAGNOSIS — I6523 Occlusion and stenosis of bilateral carotid arteries: Secondary | ICD-10-CM

## 2015-04-01 DIAGNOSIS — I2510 Atherosclerotic heart disease of native coronary artery without angina pectoris: Secondary | ICD-10-CM | POA: Insufficient documentation

## 2015-04-01 DIAGNOSIS — I10 Essential (primary) hypertension: Secondary | ICD-10-CM | POA: Diagnosis not present

## 2015-04-01 DIAGNOSIS — Z951 Presence of aortocoronary bypass graft: Secondary | ICD-10-CM | POA: Insufficient documentation

## 2015-04-01 NOTE — Progress Notes (Signed)
Patient ID: [name], male   DOB: Aug 08, 1941, 74 y.o.   MRN: [id number]  PCP: Dr Rosezetta Schlatter  HPI:  [name] is a very pleasant 74 year old male previously followed by Dr. Graceann Congress.  He has a history of CAD, multiple angioplasties to the RCA dating back to 1983 followed by CABG in 2012, HTN, and hyperlipidemia.  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  He was hospitalized in 11/11 because of recurrent chest pain. He underwent drug-eluting stent to the proximal RCA July 24, 2010. Ejection fraction was 65%. He also had 99% stenosis in the distal PDA with left to right collaterals and nonobstructive disease in the left coronary tree.  Admitted in October 2012 with [place]. Found to have progressive CAD. Underwent Coronary artery bypass grafting x4 (left internal mammary artery to LAD, saphenous vein graft to circumflex marginal, saphenous vein graft to posterior descending branch of the distal right coronary, saphenous vein graft to the posterolateral branch of the distal right coronary artery).   Carotid u/s 2/12: 40-59% bilat (stable) Cartoid u/s 3/12: LICA 60-79%, RICA 40-59%  Carotid u/s 9/13 40-59% bilaterally  Carotid u/s 1/16 40-59% bilaterally  11/14/12 ABI This led to a noninvasive lower extremity arterial study. This demonstrated noncompressible tibial arteries. His ABIs were 1.1 on the right and 1.0 on the left. His toe brachial indices were 0.77 on the right and 0.63 on the left. Waveforms were triphasic. Seen by Dr. Excell Seltzer and felt he was OK.    Previously suggested to increased Atorvastatin to 80 mg daily but previously had elevated LFTs on  daily. Has tried Crestor in past and felt it was not effective (numbers went up). Was seen in [facility] Clinic and niaspan added to help bring numbers down.  Stopped Niaspan after AIM-HIGH trial. No longer followed in lipid Clinic. Tolerating atorva 80 well. 02/23/13   He returns for yearly follow  up. Feels very good. Very active. Goes to [facility]-4x/week.Walks on TM for 10-12 min before foot begins to hurt. Also does stationary bike and weight machines. Denies SOB/CP.  SBP at home 130s. Lopressor makes him feel very fatigued.    Lab Results  Component Value Date   CHOL 168 03/26/2014   HDL 57.00 03/26/2014   LDLCALC 97 03/26/2014   TRIG 70.0 03/26/2014   CHOLHDL 3 03/26/2014    ROS: All systems negative except as listed in HPI, PMH and Problem List.  Past Medical History  Diagnosis Date  . CAD (coronary artery disease)     s/p multiple PCIs to RCA dating back to 63. Last stent to proximal RCA in November of 2011. Also had 99% stenosis in the distal PDA with left to right collaterals and nonobstructive disease in the left coronary tree. ;  ISR of RCA 10/12 - CABG 10/9 with Dr. Zenaida Niece Trigt: L-LAD, S-OM, S-PDA  . HTN (hypertension)   . Hyperlipidemia   . 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 (stable)    Current Outpatient Prescriptions  Medication Sig Dispense Refill  . amLODipine (NORVASC) 10 MG tablet TAKE 1 TABLET ONCE DAILY. 30 tablet 3  . aspirin 81 MG tablet Take 81 mg by mouth daily.    Marland Kitchen atorvastatin (LIPITOR) 80 MG tablet TAKE 1 TABLET ONCE DAILY. 30 tablet 3  . B Complex Vitamins (VITAMIN-B COMPLEX PO) Take by mouth. 1 tab daily     . ibuprofen (ADVIL,MOTRIN) 400 MG tablet Take 400 mg  by mouth every 6 (six) hours as needed for pain.    Marland Kitchen. losartan (COZAAR) 100 MG tablet Take 1 tablet (100 mg total) by mouth daily. 30 tablet 6  . Omega-3 Fatty Acids (FISH OIL DOUBLE STRENGTH) 1200 MG CAPS Take 2 capsules by mouth daily.      . Tamsulosin HCl (FLOMAX) 0.4 MG CAPS Take 0.4 mg by mouth daily. Alternating 1-2 tablets daily     . Glucosamine HCl 1000 MG TABS Take 1 tablet by mouth daily.       No current facility-administered medications for this encounter.     PHYSICAL EXAM: Filed Vitals:   04/01/15 1021  BP: 130/62  Pulse: 73  Weight: 196 lb 8 oz  (89.132 kg)  SpO2: 97%    General:  Well appearing. No resp difficulty HEENT: normal Neck: supple. JVP flat. Carotids 2+ bilaterally; no bruits. No lymphadenopathy or thryomegaly appreciated. Cor: sternal scar   PMI normal. Regular rate & rhythm. No rubs, gallops or murmurs. Lungs: clear Abdomen: soft, nontender, nondistended. No hepatosplenomegaly. No bruits or masses. Good bowel sounds. Extremities: no cyanosis, clubbing, rash, no edema. Neuro: alert & orientedx3, cranial nerves grossly intact. Moves all 4 extremities w/o difficulty. Affect pleasant.  ECG: NSR 66. iRBBB No ST-T wave abnormalities.    ASSESSMENT & PLAN: 1. CAD      --He is able to exercise at pretty good intensity without any evidence of ischemia so no need for stress testing at this time. Continue exercise, ASA and statin. Off b-blocker due to bradycardia/fatigue 2. HTN   --BP upper end of normal. If BP climbs >140  will need to consider changing losartan to Benicar or add low dose spiro. 3. Carotid stenosis   --asx. Stable u/s. Can repeat in 1 year 4. HL   --repeat lipids/CMET. Continue atorva 80  Dejai Schubach,MD 10:58 AM

## 2015-04-01 NOTE — Addendum Note (Signed)
Encounter addended by: Chyrl CivatteMegan G Josslyn Ciolek, RN on: 04/01/2015 11:16 AM<BR>     Documentation filed: Dx Association, Patient Instructions Section, Orders

## 2015-04-01 NOTE — Patient Instructions (Signed)
Fasting lab work at [facility].  Will schedule you [facility] January 2017. [facility] [address] [place], [place] [address] [phone]  Follow up 1 year.  Do the following things EVERYDAY: 1) Weigh yourself in the morning before breakfast. Write it down and keep it in a log. 2) Take your medicines as prescribed 3) Eat low salt foods-Limit salt (sodium) to 2000 mg per day.  4) Stay as active as you can everyday 5) Limit all fluids for the day to less than 2 liters

## 2015-04-17 ENCOUNTER — Telehealth (HOSPITAL_COMMUNITY): Payer: Self-pay | Admitting: *Deleted

## 2015-04-17 ENCOUNTER — Other Ambulatory Visit (INDEPENDENT_AMBULATORY_CARE_PROVIDER_SITE_OTHER): Payer: Medicare Other

## 2015-04-17 DIAGNOSIS — I2510 Atherosclerotic heart disease of native coronary artery without angina pectoris: Secondary | ICD-10-CM | POA: Diagnosis not present

## 2015-04-17 LAB — LIPID PANEL
Cholesterol: 178 mg/dL (ref 0–200)
HDL: 46.4 mg/dL (ref >39.00)
LDL Cholesterol: 113 mg/dL — ABNORMAL HIGH (ref 0–99)
NonHDL: 131.17
Total CHOL/HDL Ratio: 4
Triglycerides: 92 mg/dL (ref 0.0–149.0)
VLDL: 18.4 mg/dL (ref 0.0–40.0)

## 2015-04-17 LAB — COMPREHENSIVE METABOLIC PANEL
ALT: 19 U/L (ref 0–53)
AST: 23 U/L (ref 0–37)
Albumin: 4.2 g/dL (ref 3.5–5.2)
Alkaline Phosphatase: 54 U/L (ref 39–117)
BUN: 18 mg/dL (ref 6–23)
CO2: 29 mEq/L (ref 19–32)
Calcium: 9.6 mg/dL (ref 8.4–10.5)
Chloride: 105 mEq/L (ref 96–112)
Creatinine, Ser: 0.84 mg/dL (ref 0.40–1.50)
GFR: 95.4 mL/min (ref >60.00)
Glucose, Bld: 90 mg/dL (ref 70–99)
Potassium: 4.2 mEq/L (ref 3.5–5.1)
Sodium: 140 mEq/L (ref 135–145)
Total Bilirubin: 1 mg/dL (ref 0.2–1.2)
Total Protein: 6.9 g/dL (ref 6.0–8.3)

## 2015-04-17 NOTE — Telephone Encounter (Signed)
Lab called to ask [place] to change the resulting place.  Edited the place.  They will call back if it didn't work

## 2015-05-28 ENCOUNTER — Other Ambulatory Visit: Payer: Self-pay | Admitting: Internal Medicine

## 2015-06-28 ENCOUNTER — Other Ambulatory Visit: Payer: Self-pay | Admitting: Internal Medicine

## 2015-08-01 ENCOUNTER — Other Ambulatory Visit: Payer: Self-pay | Admitting: Internal Medicine

## 2015-08-04 ENCOUNTER — Telehealth (HOSPITAL_COMMUNITY): Payer: Self-pay

## 2015-08-04 NOTE — Telephone Encounter (Signed)
Did not hear about his lab results from August.  Reviewed lab work with patient from August

## 2015-08-31 ENCOUNTER — Other Ambulatory Visit: Payer: Self-pay | Admitting: Internal Medicine

## 2015-10-01 ENCOUNTER — Other Ambulatory Visit: Payer: Self-pay | Admitting: Internal Medicine

## 2015-11-26 ENCOUNTER — Telehealth (HOSPITAL_COMMUNITY): Payer: Self-pay | Admitting: *Deleted

## 2015-11-26 DIAGNOSIS — I6523 Occlusion and stenosis of bilateral carotid arteries: Secondary | ICD-10-CM

## 2015-11-26 NOTE — Telephone Encounter (Signed)
Pt is past due for his 6 months carotid u/s, order placed, will contact pt to schedule

## 2015-11-27 ENCOUNTER — Other Ambulatory Visit: Payer: Self-pay | Admitting: Internal Medicine

## 2015-12-01 ENCOUNTER — Ambulatory Visit (HOSPITAL_COMMUNITY)
Admission: RE | Admit: 2015-12-01 | Discharge: 2015-12-01 | Disposition: A | Discharge status: Home / Self Care | Payer: Medicare Other | Source: Ambulatory Visit | Attending: Internal Medicine | Admitting: Internal Medicine

## 2015-12-01 DIAGNOSIS — E785 Hyperlipidemia, unspecified: Secondary | ICD-10-CM | POA: Insufficient documentation

## 2015-12-01 DIAGNOSIS — I10 Essential (primary) hypertension: Secondary | ICD-10-CM | POA: Insufficient documentation

## 2015-12-01 DIAGNOSIS — I6523 Occlusion and stenosis of bilateral carotid arteries: Secondary | ICD-10-CM | POA: Diagnosis not present

## 2015-12-03 ENCOUNTER — Telehealth (HOSPITAL_COMMUNITY): Payer: Self-pay | Admitting: *Deleted

## 2015-12-03 DIAGNOSIS — I6523 Occlusion and stenosis of bilateral carotid arteries: Secondary | ICD-10-CM

## 2015-12-03 NOTE — Telephone Encounter (Signed)
-----   Message from Dolores Pattyaniel R Bensimhon, MD sent at 12/01/2015  3:27 PM EDT ----- Essentially stable 60-79% right ICA stenosis. Stable 40-59% left ICA stenosis.  Please refer to Dr. [facility]ottiBrabham at [facility] to see.

## 2015-12-03 NOTE — Telephone Encounter (Signed)
Notes Recorded by Noralee SpaceHeather M Rajendra Spiller, RN on 12/03/2015 at 4:54 PM Pt aware and agreeable, referral placed

## 2015-12-19 ENCOUNTER — Encounter: Payer: Self-pay | Admitting: Surgery

## 2015-12-22 ENCOUNTER — Other Ambulatory Visit: Payer: Self-pay | Admitting: Internal Medicine

## 2015-12-30 ENCOUNTER — Encounter: Payer: Self-pay | Admitting: Surgery

## 2015-12-30 ENCOUNTER — Ambulatory Visit (INDEPENDENT_AMBULATORY_CARE_PROVIDER_SITE_OTHER): Payer: Medicare Other | Admitting: Surgery

## 2015-12-30 VITALS — BP 152/64 | HR 68 | Temp 97.2°F | Resp 16 | Ht 67.0 in | Wt 202.0 lb

## 2015-12-30 DIAGNOSIS — I6523 Occlusion and stenosis of bilateral carotid arteries: Secondary | ICD-10-CM

## 2015-12-30 NOTE — Progress Notes (Signed)
Patient name: [name] MRN: [id number] DOB: 08-04-1941 Sex: male   Referred by: Dr. Gala Romney  Reason for referral:  Chief Complaint  Patient presents with  . Carotid    REF- Dr. Jones Broom,   Pt has had CABG x4 in 2012 by Dr. Morton Peters.     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  The patient has been followed with ultrasound for carotid stenosis.  Most recently show progression on the right side, now measuring 60-79% stenosis.  The left is stable at 40-59% stenosis.  Patient is asymptomatic.  Specifically, he denies numbness or weakness in either extremity.  He denies slurred speech.  He denies amaurosis fugax.  The patient has a history of coronary artery disease.  He is status post CABG.  He is medically managed for hypercholesterolemia with a statin.  His blood pressures controlled with an ARB.  He is a nonsmoker.  He has been evaluated in the past by Dr. Excell Seltzer for claudication.  His ABIs were normal.  He continues to participate and regular activity at the [facility].  Past Medical History  Diagnosis Date  . CAD (coronary artery disease)     s/p multiple PCIs to RCA dating back to 63. Last stent to proximal RCA in November of 2011. Also had 99% stenosis in the distal PDA with left to right collaterals and nonobstructive disease in the left coronary tree. ;  ISR of RCA 10/12 - CABG 10/9 with Dr. Zenaida Niece Trigt: L-LAD, S-OM, S-PDA  . HTN (hypertension)   . Hyperlipidemia   . 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 (stable)    Past Surgical History  Procedure Laterality Date  . Left total hip replacement  2008  . Coronary stent placement  Nov 2011    prox RCA. Has had prior multiple procedures.  . Coronary artery bypass grafting x4 (left internal mammary artery  06/23/2011  . Tooth extraction  06/2013    Social History   Social History  . Marital Status:  Married    Spouse Name: N/A  . Number of Children: N/A  . Years of Education: N/A   Occupational History  . Not on file.   Social History Main Topics  . Smoking status: Never Smoker   . Smokeless tobacco: Never Used  . Alcohol Use: 3.6 oz/week    6 Cans of beer per week  . Drug Use: No  . Sexual Activity: Not on file   Other Topics Concern  . Not on file   Social History Narrative    Family History  Problem Relation Age of Onset  . Heart disease Father   . Colon cancer Neg Hx   . Stomach cancer Neg Hx     Allergies as of 12/30/2015 - Review Complete 12/30/2015  Allergen Reaction Noted  . Morphine Nausea Only     Current Outpatient Prescriptions on File Prior to Visit  Medication Sig Dispense Refill  . amLODipine (NORVASC) 10 MG tablet TAKE 1 TABLET ONCE DAILY. 30 tablet 3  . aspirin 81 MG tablet Take 81 mg by mouth daily.    Marland Kitchen atorvastatin (LIPITOR) 80 MG tablet TAKE 1 TABLET ONCE DAILY. 30 tablet 3  . B Complex Vitamins (VITAMIN-B COMPLEX PO) Take by mouth. 1 tab daily     . ibuprofen (ADVIL,MOTRIN) 400 MG tablet Take 400 mg by mouth every 6 (six) hours  as needed for pain.    Marland Kitchen. losartan (COZAAR) 100 MG tablet TAKE 1 TABLET DAILY. 90 tablet 0  . Omega-3 Fatty Acids (FISH OIL DOUBLE STRENGTH) 1200 MG CAPS Take 2 capsules by mouth daily.      . Tamsulosin HCl (FLOMAX) 0.4 MG CAPS Take 0.4 mg by mouth daily. Alternating 1-2 tablets daily     . Glucosamine HCl 1000 MG TABS Take 1 tablet by mouth daily. Reported on 12/30/2015     No current facility-administered medications on file prior to visit.     REVIEW OF SYSTEMS: Cardiovascular: Positive for chest pain, palpitations, orthopnea, or dyspnea on exertion. No claudication or rest pain,  No history of DVT or phlebitis. Pulmonary: No productive cough, asthma or wheezing. Neurologic: No weakness, paresthesias, aphasia, or amaurosis. No dizziness. Hematologic: No bleeding problems or clotting disorders. Musculoskeletal:  No joint pain or joint swelling. Gastrointestinal: No blood in stool or hematemesis Genitourinary: No dysuria or hematuria. Psychiatric:: No history of major depression. Integumentary: No rashes or ulcers. Constitutional: No fever or chills.  PHYSICAL EXAMINATION:  Filed Vitals:   12/30/15 1121 12/30/15 1123 12/30/15 1126  BP: 173/71 164/67 152/64  Pulse: 72 66 68  Temp:  97.2 F (36.2 C)   TempSrc:  Oral   Resp: 16    Height:  5\' 7"  (1.702 m)   Weight:  202 lb (91.627 kg)   SpO2:  98%    Body mass index is 31.63 kg/(m^2). General: The patient appears their stated age.   HEENT:  No gross abnormalities Pulmonary: Respirations are non-labored Abdomen: Soft and non-tender.  No aneurysm palpated  Musculoskeletal: There are no major deformities.   Neurologic: No focal weakness or paresthesias are detected, Skin: There are no ulcer or rashes noted. Psychiatric: The patient has normal affect. Cardiovascular: There is a regular rate and rhythm without significant murmur appreciated.Palpable pedal pulse on the right.  Some difficulty palpating on the left.  No carotid bruits.  Diagnostic Studies: I have reviewed his carotid Doppler studies which shows 60-79% right carotid stenosis and 40-59 percent left carotid stenosis  Assessment:  Bilateral carotid stenosis, right greater than left Plan: We discussed the indications for surgery which currently would be asymptomatic disease greater than 80% or the presence of symptoms with stenosis greater than 50%.  Up until these endpoints, the patient will continue with maximal medical therapy, which currently he is receiving.  I have encouraged him to be active.  We also discussed the signs and symptoms of stroke and if these occur to go emergently to the ER.  I haven't scheduled for follow up again with a duplex ultrasound in 1 year    V. Charlena CrossWells Saleena Tamas IV, M.D. [facility] [place] Office: [phone] Pager:   [phone]

## 2016-01-28 ENCOUNTER — Other Ambulatory Visit: Payer: Self-pay | Admitting: Internal Medicine

## 2016-01-29 ENCOUNTER — Other Ambulatory Visit: Payer: Self-pay | Admitting: Internal Medicine

## 2016-01-29 DIAGNOSIS — M545 Low back pain, unspecified: Secondary | ICD-10-CM | POA: Insufficient documentation

## 2016-01-29 DIAGNOSIS — N529 Male erectile dysfunction, unspecified: Secondary | ICD-10-CM | POA: Insufficient documentation

## 2016-01-29 DIAGNOSIS — H9190 Unspecified hearing loss, unspecified ear: Secondary | ICD-10-CM | POA: Insufficient documentation

## 2016-01-29 DIAGNOSIS — M1990 Unspecified osteoarthritis, unspecified site: Secondary | ICD-10-CM | POA: Insufficient documentation

## 2016-01-29 DIAGNOSIS — I10 Essential (primary) hypertension: Secondary | ICD-10-CM | POA: Insufficient documentation

## 2016-01-29 DIAGNOSIS — E669 Obesity, unspecified: Secondary | ICD-10-CM | POA: Insufficient documentation

## 2016-02-06 ENCOUNTER — Other Ambulatory Visit: Payer: Self-pay | Admitting: *Deleted

## 2016-02-06 DIAGNOSIS — I6523 Occlusion and stenosis of bilateral carotid arteries: Secondary | ICD-10-CM

## 2016-02-27 ENCOUNTER — Other Ambulatory Visit: Payer: Self-pay | Admitting: Internal Medicine

## 2016-03-26 ENCOUNTER — Other Ambulatory Visit: Payer: Self-pay | Admitting: *Deleted

## 2016-03-26 MED ORDER — ATORVASTATIN CALCIUM 80 MG PO TABS: 80.0000 mg | ORAL_TABLET | Freq: Every day | ORAL | Status: DC

## 2016-04-03 ENCOUNTER — Other Ambulatory Visit (HOSPITAL_COMMUNITY): Payer: Self-pay | Admitting: Internal Medicine

## 2016-05-03 ENCOUNTER — Encounter (HOSPITAL_COMMUNITY): Payer: Self-pay | Admitting: Internal Medicine

## 2016-05-03 ENCOUNTER — Ambulatory Visit (HOSPITAL_COMMUNITY)
Admission: RE | Admit: 2016-05-03 | Discharge: 2016-05-03 | Disposition: A | Discharge status: Home / Self Care | Payer: Medicare Other | Source: Ambulatory Visit | Attending: Internal Medicine | Admitting: Internal Medicine

## 2016-05-03 VITALS — BP 168/78 | HR 81 | Resp 18 | Wt 201.1 lb

## 2016-05-03 DIAGNOSIS — Z79899 Other long term (current) drug therapy: Secondary | ICD-10-CM | POA: Diagnosis not present

## 2016-05-03 DIAGNOSIS — I6523 Occlusion and stenosis of bilateral carotid arteries: Secondary | ICD-10-CM

## 2016-05-03 DIAGNOSIS — I509 Heart failure, unspecified: Secondary | ICD-10-CM | POA: Insufficient documentation

## 2016-05-03 DIAGNOSIS — I10 Essential (primary) hypertension: Secondary | ICD-10-CM | POA: Diagnosis not present

## 2016-05-03 DIAGNOSIS — E785 Hyperlipidemia, unspecified: Secondary | ICD-10-CM | POA: Diagnosis not present

## 2016-05-03 DIAGNOSIS — I2510 Atherosclerotic heart disease of native coronary artery without angina pectoris: Secondary | ICD-10-CM | POA: Diagnosis not present

## 2016-05-03 DIAGNOSIS — I110 Hypertensive heart disease with heart failure: Secondary | ICD-10-CM | POA: Insufficient documentation

## 2016-05-03 LAB — COMPREHENSIVE METABOLIC PANEL
ALT: 24 U/L (ref 17–63)
AST: 24 U/L (ref 15–41)
Albumin: 4 g/dL (ref 3.5–5.0)
Alkaline Phosphatase: 50 U/L (ref 38–126)
Anion gap: 8 (ref 5–15)
BUN: 13 mg/dL (ref 6–20)
CO2: 24 mmol/L (ref 22–32)
Calcium: 9.5 mg/dL (ref 8.9–10.3)
Chloride: 109 mmol/L (ref 101–111)
Creatinine, Ser: 0.86 mg/dL (ref 0.61–1.24)
GFR calc Af Amer: >60 mL/min (ref >60)
GFR calc non Af Amer: >60 mL/min (ref >60)
Glucose, Bld: 99 mg/dL (ref 65–99)
Potassium: 4 mmol/L (ref 3.5–5.1)
Sodium: 141 mmol/L (ref 135–145)
Total Bilirubin: 1.6 mg/dL — ABNORMAL HIGH (ref 0.3–1.2)
Total Protein: 6.5 g/dL (ref 6.5–8.1)

## 2016-05-03 LAB — LIPID PANEL
Cholesterol: 176 mg/dL (ref 0–200)
HDL: 46 mg/dL (ref >40)
LDL Cholesterol: 116 mg/dL — ABNORMAL HIGH (ref 0–99)
Total CHOL/HDL Ratio: 3.8 RATIO
Triglycerides: 69 mg/dL (ref <150)
VLDL: 14 mg/dL (ref 0–40)

## 2016-05-03 NOTE — Progress Notes (Signed)
Advanced Heart Failure Medication Review by a Pharmacist  Does the patient  feel that his/her medications are working for him/her?  yes  Has the patient been experiencing any side effects to the medications prescribed?  no  Does the patient measure his/her own blood pressure or blood glucose at home?  no   Does the patient have any problems obtaining medications due to transportation or finances?   no  Understanding of regimen: good Understanding of indications: good Potential of compliance: good Patient understands to avoid NSAIDs. Patient understands to avoid decongestants.  Issues to address at subsequent visits: None   Pharmacist comments:  Mr. [name] is a pleasant 75 yo M presenting with a current medication list. He reports good compliance with his regimen and did not have any specific medication-related questions or concerns for me at this time.   Tyler DeisErika K. Bonnye FavaNicolsen, PharmD, BCPS, CPP Clinical Pharmacist Pager: [phone] Phone: [phone] 05/03/2016 10:12 AM      Time with patient: 10 minutes Preparation and documentation time: 2 minutes Total time: 12 minutes

## 2016-05-03 NOTE — Progress Notes (Signed)
Patient ID: [name] DaneGeorge A [name], male   DOB: 07/28/1941, 75 y.o.   MRN: [id number]    Advanced Heart Failure Clinic Note   PCP: Dr Rosezetta SchlatterBurnette Primary Cardiologist: Dr. Gala RomneyBensimhon   HPI:  [name] is a very pleasant 75 y.o. male previously followed by Dr. Graceann CongressJoseph Zellwood.  He has a history of CAD, multiple angioplasties to the RCA dating back to 1983 followed by CABG in 2012, HTN, and hyperlipidemia.  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  He was hospitalized in 11/11 because of recurrent chest pain. He underwent drug-eluting stent to the proximal RCA July 24, 2010. Ejection fraction was 65%. He also had 99% stenosis in the distal PDA with left to right collaterals and nonobstructive disease in the left coronary tree.  Admitted in October 2012 with [place]. Found to have progressive CAD. Underwent Coronary artery bypass grafting x4 (left internal mammary artery to LAD, saphenous vein graft to circumflex marginal, saphenous vein graft to posterior descending branch of the distal right coronary, saphenous vein graft to the posterolateral branch of the distal right coronary artery).   Carotid u/s 2/12: 40-59% bilat (stable) Cartoid u/s 3/12: LICA 60-79%, RICA 40-59%  Carotid u/s 9/13 40-59% bilaterally  Carotid u/s 1/16 40-59% bilaterally  Carotid u/s 3/17 RICA 60-79%, LICA 40-59% - referred to [facility]  11/14/12 ABI This led to a noninvasive lower extremity arterial study. This demonstrated noncompressible tibial arteries. His ABIs were 1.1 on the right and 1.0 on the left. His toe brachial indices were 0.77 on the right and 0.63 on the left. Waveforms were triphasic. Seen by Dr. Excell Seltzerooper and felt he was OK.    Previously suggested to increased Atorvastatin to 80 mg daily but previously had elevated LFTs on 80mg  daily. Has tried Crestor in past and felt it was not effective (numbers went up). Was seen in Lipid Clinic and niaspan added to help bring numbers down.   Stopped Niaspan after AIM-HIGH trial. No longer followed in lipid Clinic. Tolerating atorva 80 well. 02/23/13   He presents today for yearly follow up. Last Carotid US with 60-79% RICA stenosis, 40-59% LICA.  Referred to [facility], watchful waiting for now. Feels good overall. Continues to exercise.  [facility]. Does weights, stretching, and TM.  Can walk 10-15 minutes for plantar fasciitis in left foot starts to heart. Denies any SOB. No CP. Denies lightheadedness or dizziness. BP high today.  Usually 130 -140 at home.  200 on initial check. 174 (Right) and 168 (left) SBP on my recheck   Lab Results  Component Value Date   CHOL 178 04/17/2015   HDL 46.40 04/17/2015   LDLCALC 113 (H) 04/17/2015   TRIG 92.0 04/17/2015   CHOLHDL 4 04/17/2015    ROS: All systems negative except as listed in HPI, PMH and Problem List.  Past Medical History:  Diagnosis Date  . CAD (coronary artery disease)    s/p multiple PCIs to RCA dating back to 671983. Last stent to proximal RCA in November of 2011. Also had 99% stenosis in the distal PDA with left to right collaterals and nonobstructive disease in the left coronary tree. ;  ISR of RCA 10/12 - CABG 10/9 with Dr. Zenaida NieceVan Trigt: L-LAD, S-OM, S-PDA  . 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 (stable)  . HTN (hypertension)   . Hyperlipidemia     Current Outpatient Prescriptions  Medication Sig Dispense Refill  .  acetaminophen (TYLENOL) 325 MG tablet Take 650 mg by mouth every 6 (six) hours as needed for mild pain or headache.    Marland Kitchen. amLODipine (NORVASC) 10 MG tablet TAKE 1 TABLET ONCE DAILY. 30 tablet 3  . aspirin 81 MG tablet Take 81 mg by mouth daily.    Marland Kitchen. atorvastatin (LIPITOR) 80 MG tablet Take 1 tablet (80 mg total) by mouth daily. 90 tablet 3  . losartan (COZAAR) 100 MG tablet TAKE 1 TABLET DAILY. 90 tablet 0  . Omega-3 Fatty Acids (FISH OIL DOUBLE STRENGTH) 1200 MG CAPS Take 2 capsules by mouth daily.      . Tamsulosin HCl  (FLOMAX) 0.4 MG CAPS Take 0.8 mg by mouth daily.     Marland Kitchen. amoxicillin (AMOXIL) 250 MG capsule Take 250 mg by mouth as directed. 4 tablets 1 hour prior to having dental procedures     No current facility-administered medications for this encounter.      PHYSICAL EXAM: Vitals:   05/03/16 0945 05/03/16 1015  BP: (!) 190/76 (!) 168/78  BP Location: Right Arm Left Arm  Patient Position: Sitting   Cuff Size: Normal   Pulse: 81   Resp: 18   SpO2: 95%   Weight: 201 lb 2 oz (91.2 kg)     General:  Well appearing. No resp difficulty HEENT: normal Neck: supple. JVP flat. Carotids 2+ bilaterally; Bilateral bruits. No thyromegaly or nodule noted.  Cor: sternal scar. PMI normal. RRR. No M/G/R Lungs: CTAB, normal effort Abdomen: soft, NT, ND, no HSM. No bruits or masses. +BS  Extremities: no cyanosis, clubbing, rash. No peripheral edema.  Neuro: alert & orientedx3, cranial nerves grossly intact. Moves all 4 extremities w/o difficulty. Affect pleasant.  ECG: NSR 61 bpm   ASSESSMENT & PLAN: 1. CAD    - No evidence of iscemia at this time, so no need for stress testing.  - Continue exercise, increase as able. On ASA and statin.  - Off b-blocker due to bradycardia/fatigue 2. HTN - BP elevated today > 190 initially - Continue losartan 100 mg daily and amlodipine 10 mg daily.  - He states this is an outlier for him and would prefer to monitor with BP cuff at home BID x 2 weeks. Will fax/send results to office.  - If remains high, will add low-dose spiro.  3. Carotid stenosis - Remains asx. Established with Dr. Myra GianottiBrabham now.   - Repeats due 11/2016 4. HL - repeat lipids/CMET today - Continue atorva 80  Follow up yearly or prn.   Graciella FreerMichael Andrew Tillery, PA-C 10:18 AM  Patient seen and examined with Otilio SaberAndy Tillery, PA-C. We discussed all aspects of the encounter. I agree with the assessment and plan as stated above.   Overall doing very well. No ischemia. BP very high to day but he says it  is usually well controlled. Will have him keep BP log for 2 weeks and get back to us. Can adjust as needed. Continue ASA and statin.   F/u 1 year.   Koryn Charlot,MD 10:01 PM

## 2016-05-03 NOTE — Patient Instructions (Signed)
Routine lab work today. Will notify you of abnormal results, otherwise no news is good news!  Check BP 1 hour after morning and evening meds for 2 weeks. Call or fax results to our office. Phone: [phone] Fax: [phone]  Follow up 1 year with Dr. Gala RomneyBensimhon. We will call you closer to this time, or you may call our office to schedule 1 month before you are due to be seen.  Do the following things EVERYDAY: 1) Weigh yourself in the morning before breakfast. Write it down and keep it in a log. 2) Take your medicines as prescribed 3) Eat low salt foods-Limit salt (sodium) to 2000 mg per day.  4) Stay as active as you can everyday 5) Limit all fluids for the day to less than 2 liters

## 2016-05-06 ENCOUNTER — Telehealth (HOSPITAL_COMMUNITY): Payer: Self-pay | Admitting: *Deleted

## 2016-05-06 MED ORDER — EZETIMIBE 10 MG PO TABS: 10.0000 mg | ORAL_TABLET | Freq: Every day | ORAL | 3 refills | Status: DC

## 2016-05-06 NOTE — Telephone Encounter (Signed)
Notes Recorded by Noralee SpaceHeather M Augustin Bun, RN on 05/06/2016 at 2:33 PM EDT Patient aware. rx sent in

## 2016-05-06 NOTE — Telephone Encounter (Signed)
-----   Message from Dolores Pattyaniel R Bensimhon, MD sent at 05/05/2016 12:59 AM EDT ----- LDL not at goal. Add zetia 10

## 2016-06-09 ENCOUNTER — Telehealth (HOSPITAL_COMMUNITY): Payer: Self-pay | Admitting: *Deleted

## 2016-06-09 DIAGNOSIS — I10 Essential (primary) hypertension: Secondary | ICD-10-CM

## 2016-06-09 NOTE — Telephone Encounter (Signed)
Pt dropped off his home BP readings, BP randed 136-173/60-70s, per Dr Gala RomneyBensimhon add Cleda DaubSpiro 12.5 mg check bmet in 1-2 weeks after starting.  Attempted to call pt and Left message to call back

## 2016-06-10 NOTE — Telephone Encounter (Signed)
Left message to call back  

## 2016-06-17 NOTE — Telephone Encounter (Signed)
Left message to call back  

## 2016-06-23 MED ORDER — SPIRONOLACTONE 25 MG PO TABS
12.5000 mg | ORAL_TABLET | Freq: Every day | ORAL | 6 refills | Status: DC
Start: 2016-06-23 — End: 2017-01-22

## 2016-06-23 NOTE — Telephone Encounter (Signed)
Finally was able to speak with pt, he is aware and rx sent in, labs sch for 10/20, if he is out of town that day he will call to reschedule

## 2016-06-30 ENCOUNTER — Other Ambulatory Visit (HOSPITAL_COMMUNITY): Payer: Self-pay | Admitting: Internal Medicine

## 2016-07-01 ENCOUNTER — Other Ambulatory Visit: Payer: Medicare Other

## 2016-07-02 ENCOUNTER — Ambulatory Visit (HOSPITAL_COMMUNITY)
Admission: RE | Admit: 2016-07-02 | Discharge: 2016-07-02 | Disposition: A | Discharge status: Home / Self Care | Payer: Medicare Other | Source: Ambulatory Visit | Attending: Internal Medicine | Admitting: Internal Medicine

## 2016-07-02 DIAGNOSIS — I10 Essential (primary) hypertension: Secondary | ICD-10-CM | POA: Diagnosis present

## 2016-07-02 LAB — BASIC METABOLIC PANEL
Anion gap: 6 (ref 5–15)
BUN: 16 mg/dL (ref 6–20)
CO2: 25 mmol/L (ref 22–32)
Calcium: 9.5 mg/dL (ref 8.9–10.3)
Chloride: 107 mmol/L (ref 101–111)
Creatinine, Ser: 0.91 mg/dL (ref 0.61–1.24)
GFR calc Af Amer: >60 mL/min (ref >60)
GFR calc non Af Amer: >60 mL/min (ref >60)
Glucose, Bld: 99 mg/dL (ref 65–99)
Potassium: 4.4 mmol/L (ref 3.5–5.1)
Sodium: 138 mmol/L (ref 135–145)

## 2016-07-27 ENCOUNTER — Other Ambulatory Visit (HOSPITAL_COMMUNITY): Payer: Self-pay | Admitting: Internal Medicine

## 2016-09-11 ENCOUNTER — Other Ambulatory Visit (HOSPITAL_COMMUNITY): Payer: Self-pay | Admitting: Internal Medicine

## 2016-09-14 ENCOUNTER — Other Ambulatory Visit (HOSPITAL_COMMUNITY): Payer: Self-pay | Admitting: *Deleted

## 2016-09-15 ENCOUNTER — Other Ambulatory Visit (HOSPITAL_COMMUNITY): Payer: Self-pay | Admitting: Internal Medicine

## 2016-10-31 ENCOUNTER — Other Ambulatory Visit (HOSPITAL_COMMUNITY): Payer: Self-pay | Admitting: Internal Medicine

## 2017-01-06 ENCOUNTER — Encounter: Payer: Self-pay | Admitting: Family

## 2017-01-18 ENCOUNTER — Ambulatory Visit (HOSPITAL_COMMUNITY)
Admission: RE | Admit: 2017-01-18 | Discharge: 2017-01-18 | Disposition: A | Discharge status: Home / Self Care | Payer: Medicare HMO | Source: Ambulatory Visit | Attending: Vascular Surgery | Admitting: Vascular Surgery

## 2017-01-18 ENCOUNTER — Ambulatory Visit (INDEPENDENT_AMBULATORY_CARE_PROVIDER_SITE_OTHER): Payer: Medicare HMO | Admitting: Family

## 2017-01-18 ENCOUNTER — Encounter: Payer: Self-pay | Admitting: Family

## 2017-01-18 VITALS — BP 146/59 | HR 60 | Temp 97.4°F | Resp 18 | Ht 67.0 in | Wt 202.0 lb

## 2017-01-18 DIAGNOSIS — I6523 Occlusion and stenosis of bilateral carotid arteries: Secondary | ICD-10-CM | POA: Insufficient documentation

## 2017-01-18 LAB — VAS US CAROTID
LEFT ECA DIAS: -3 cm/s
LEFT VERTEBRAL DIAS: -16 cm/s
Left CCA dist dias: 13 cm/s
Left CCA dist sys: 98 cm/s
Left CCA prox dias: 13 cm/s
Left CCA prox sys: 136 cm/s
Left ICA dist dias: -20 cm/s
Left ICA dist sys: -86 cm/s
Left ICA prox dias: -33 cm/s
Left ICA prox sys: -209 cm/s
RIGHT CCA MID DIAS: 11 cm/s
RIGHT ECA DIAS: -11 cm/s
RIGHT VERTEBRAL DIAS: 14 cm/s
Right CCA prox sys: -79 cm/s
Right cca dist sys: -94 cm/s

## 2017-01-18 NOTE — Progress Notes (Signed)
Chief Complaint: Follow up Extracranial Carotid Artery Stenosis   History of Present Illness  [name] is a 76 y.o. male who was referred by Dr. 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  Dr. Myra Gianotti evaluated pt initially in April of 2017. The patient had been followed with ultrasound for carotid stenosis, measuring 60-79% stenosis.  The left was stable at 40-59% stenosis.  He denies any known history of stroke or TIA. Specifically he denies a history of amaurosis fugax or monocular blindness, unilateral facial drooping, hemiplegia, or receptive or expressive aphasia.    The patient has a history of coronary artery disease.  He is status post CABG.  He is medically managed for hypercholesterolemia with a statin.  His blood pressures controlled with an ARB.  He is a nonsmoker.  He has been evaluated in the past by Dr. Excell Seltzer for claudication.  His ABIs were normal.  He continues to participate and regular activity at the [facility], 3-4 days/week, works in his yard, plays golf.  He denies claudication symptoms with walking, but does report right gluteal and low back pain with walking, relieved by rest. Pt states that has been evaluated, states this is muscle spasms, states he has a tight tendon.   Pt Diabetic: no Pt smoker: non-smoker  Pt meds include: Statin : yes ASA: yes Other anticoagulants/antiplatelets: no   Past Medical History:  Diagnosis Date  . CAD (coronary artery disease)    s/p multiple PCIs to RCA dating back to 16. Last stent to proximal RCA in November of 2011. Also had 99% stenosis in the distal PDA with left to right collaterals and nonobstructive disease in the left coronary tree. ;  ISR of RCA 10/12 - CABG 10/9 with Dr. Zenaida Niece Trigt: L-LAD, S-OM, S-PDA  . 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 (stable)  . HTN (hypertension)   . Hyperlipidemia     Social History Social History  Substance Use Topics  . Smoking status: Never Smoker   . Smokeless tobacco: Never Used  . Alcohol use 3.6 oz/week    6 Cans of beer per week    Family History Family History  Problem Relation Age of Onset  . Heart disease Father   . Colon cancer Neg Hx   . Stomach cancer Neg Hx     Surgical History Past Surgical History:  Procedure Laterality Date  . Coronary artery bypass grafting x4 (left internal mammary artery  06/23/2011  . CORONARY STENT PLACEMENT  Nov 2011   prox RCA. Has had prior multiple procedures.  Marland Kitchen left total hip replacement  2008  . TOOTH EXTRACTION  06/2013    Allergies  Allergen Reactions  . Morphine Nausea Only    Current Outpatient Prescriptions  Medication Sig Dispense Refill  . acetaminophen (TYLENOL) 325 MG tablet Take 650 mg by mouth every 6 (six) hours as needed for mild pain or headache.    Marland Kitchen amLODipine (NORVASC) 10 MG tablet TAKE 1 TABLET ONCE DAILY. 90 tablet 2  . amoxicillin (AMOXIL) 250 MG capsule Take 250 mg by mouth as directed. 4 tablets 1 hour prior to having dental procedures    . aspirin 81 MG tablet Take 81 mg by mouth daily.    Marland Kitchen atorvastatin (LIPITOR) 80 MG tablet Take 1 tablet (80 mg total) by mouth daily. 90 tablet 3  . ezetimibe (ZETIA) 10 MG tablet Take 1 tablet (10 mg total) by mouth daily. 90 tablet 3  . losartan (COZAAR) 100  MG tablet TAKE 1 TABLET DAILY. 90 tablet 3  . NITROSTAT 0.4 MG SL tablet 1 TAB UNDER TONGUE AS NEEDED FOR CHEST PAIN. MAY REPEAT EVERY 5 MIN FOR A TOTAL OF 3 DOSES. 25 tablet 0  . Omega-3 Fatty Acids (FISH OIL DOUBLE STRENGTH) 1200 MG CAPS Take 2 capsules by mouth daily.      . Tamsulosin HCl (FLOMAX) 0.4 MG CAPS Take 0.8 mg by mouth daily.     Marland Kitchen spironolactone (ALDACTONE) 25 MG tablet Take 0.5 tablets (12.5 mg total) by mouth daily. 15 tablet 6   No current facility-administered medications for this visit.     Review of Systems : See HPI for pertinent positives and negatives.  Physical Examination  Vitals:   01/18/17 1145 01/18/17 1148 01/18/17 1149   BP: (!) 150/68 (!) 151/72 (!) 146/59  Pulse: 60  60  Resp: 18    Temp: 97.4 F (36.3 C)    TempSrc: Oral    SpO2: 97%    Weight: 202 lb (91.6 kg)    Height: 5\' 7"  (1.702 m)     Body mass index is 31.64 kg/m.  General: WDWN obese male in NAD GAIT: normal Eyes: PERRLA Pulmonary:  Respirations are non-labored, good air movement, CTAB, no rales, rhonchi, or wheezing.  Cardiac: regular rhythm, no detected murmur.  VASCULAR EXAM Carotid Bruits Right Left   positive Negative    Aorta is not palpable. Radial pulses are 2+ palpable and equal.                                                                                                                            LE Pulses Right Left       POPLITEAL  not palpable   not palpable       POSTERIOR TIBIAL  not palpable   not palpable        DORSALIS PEDIS      ANTERIOR TIBIAL 2+ palpable  1+ palpable     Gastrointestinal: soft, nontender, BS WNL, no r/g, no palpable masses.  Musculoskeletal: No muscle atrophy/wasting. M/S 5/5 throughout, extremities without ischemic changes.  Neurologic: A&O X 3; Appropriate Affect, Speech is normal CN 2-12 intact, pain and light touch intact in extremities, Motor exam as listed above.     Assessment: [name] is a 76 y.o. male who has a history of CAD and a carotid bruit, but no history of stroke or TIA.   Fortunately he does not have DM, has never used tobacco, and stays very active.  He takes a daily statin and ASA.   DATA (01/18/17): Carotid duplex: No significant stenosis of the bilateral CA or ECA. Right ICA: 40-59% stenosis. Left ICA: <40% stenosis. Bilateral vertebral artery flow is antegrade.  Bilateral subclavian artery waveforms are normal.  Less stenosis of the bilateral ICA compared to the exam at [facility] on 12-01-15.    Plan: Follow-up in 1 year with Carotid Duplex scan.  I discussed in depth with the patient the nature of  atherosclerosis, and emphasized the importance of maximal medical management including strict control of blood pressure, blood glucose, and lipid levels, obtaining regular exercise, and continued cessation of smoking.  The patient is aware that without maximal medical management the underlying atherosclerotic disease process will progress, limiting the benefit of any interventions. The patient was given information about stroke prevention and what symptoms should prompt the patient to seek immediate medical care. Thank you for allowing us to participate in this patient's care.  Charisse MarchSuzanne Kass Herberger, RN, MSN, FNP-C [facility] [place] Office: [phone]  Clinic Physician: Early  01/18/17 11:57 AM

## 2017-01-18 NOTE — Patient Instructions (Signed)
Stroke Prevention Some medical conditions and behaviors are associated with an increased chance of having a stroke. You may prevent a stroke by making healthy choices and managing medical conditions. How can I reduce my risk of having a stroke?  Stay physically active. Get at least 30 minutes of activity on most or all days.  Do not smoke. It may also be helpful to avoid exposure to secondhand smoke.  Limit alcohol use. Moderate alcohol use is considered to be:  No more than 2 drinks per day for men.  No more than 1 drink per day for nonpregnant women.  Eat healthy foods. This involves:  Eating 5 or more servings of fruits and vegetables a day.  Making dietary changes that address high blood pressure (hypertension), high cholesterol, diabetes, or obesity.  Manage your cholesterol levels.  Making food choices that are high in fiber and low in saturated fat, trans fat, and cholesterol may control cholesterol levels.  Take any prescribed medicines to control cholesterol as directed by your health care provider.  Manage your diabetes.  Controlling your carbohydrate and sugar intake is recommended to manage diabetes.  Take any prescribed medicines to control diabetes as directed by your health care provider.  Control your hypertension.  Making food choices that are low in salt (sodium), saturated fat, trans fat, and cholesterol is recommended to manage hypertension.  Ask your health care provider if you need treatment to lower your blood pressure. Take any prescribed medicines to control hypertension as directed by your health care provider.  If you are 18-39 years of age, have your blood pressure checked every 3-5 years. If you are 40 years of age or older, have your blood pressure checked every year.  Maintain a healthy weight.  Reducing calorie intake and making food choices that are low in sodium, saturated fat, trans fat, and cholesterol are recommended to manage  weight.  Stop drug abuse.  Avoid taking birth control pills.  Talk to your health care provider about the risks of taking birth control pills if you are over 35 years old, smoke, get migraines, or have ever had a blood clot.  Get evaluated for sleep disorders (sleep apnea).  Talk to your health care provider about getting a sleep evaluation if you snore a lot or have excessive sleepiness.  Take medicines only as directed by your health care provider.  For some people, aspirin or blood thinners (anticoagulants) are helpful in reducing the risk of forming abnormal blood clots that can lead to stroke. If you have the irregular heart rhythm of atrial fibrillation, you should be on a blood thinner unless there is a good reason you cannot take them.  Understand all your medicine instructions.  Make sure that other conditions (such as anemia or atherosclerosis) are addressed. Get help right away if:  You have sudden weakness or numbness of the face, arm, or leg, especially on one side of the body.  Your face or eyelid droops to one side.  You have sudden confusion.  You have trouble speaking (aphasia) or understanding.  You have sudden trouble seeing in one or both eyes.  You have sudden trouble walking.  You have dizziness.  You have a loss of balance or coordination.  You have a sudden, severe headache with no known cause.  You have new chest pain or an irregular heartbeat. Any of these symptoms may represent a serious problem that is an emergency. Do not wait to see if the symptoms will go away.   Get medical help at once. Call your local emergency services (911 in [place] Do not drive yourself to the hospital. This information is not intended to replace advice given to you by your health care provider. Make sure you discuss any questions you have with your health care provider. Document Released: 10/07/2004 Document Revised: 02/05/2016 Document Reviewed: 03/02/2013 Elsevier  Interactive Patient Education  2017 Elsevier Inc.  

## 2017-01-22 ENCOUNTER — Other Ambulatory Visit (HOSPITAL_COMMUNITY): Payer: Self-pay | Admitting: Internal Medicine

## 2017-01-24 NOTE — Addendum Note (Signed)
Addended by: Burton ApleyPETTY, Jakub Debold A on: 01/24/2017 10:26 AM   Modules accepted: Orders

## 2017-03-28 ENCOUNTER — Other Ambulatory Visit (HOSPITAL_COMMUNITY): Payer: Self-pay | Admitting: Internal Medicine

## 2017-04-01 ENCOUNTER — Other Ambulatory Visit (HOSPITAL_COMMUNITY): Payer: Self-pay | Admitting: Internal Medicine

## 2017-04-13 ENCOUNTER — Ambulatory Visit: Payer: Medicare HMO | Admitting: Adult Health

## 2017-04-18 ENCOUNTER — Ambulatory Visit (HOSPITAL_COMMUNITY)
Admission: RE | Admit: 2017-04-18 | Discharge: 2017-04-18 | Disposition: A | Discharge status: Home / Self Care | Payer: Medicare HMO | Source: Ambulatory Visit | Attending: Internal Medicine | Admitting: Internal Medicine

## 2017-04-18 ENCOUNTER — Encounter (HOSPITAL_COMMUNITY): Payer: Self-pay | Admitting: Internal Medicine

## 2017-04-18 VITALS — BP 138/62 | HR 68 | Wt 201.0 lb

## 2017-04-18 DIAGNOSIS — R001 Bradycardia, unspecified: Secondary | ICD-10-CM | POA: Insufficient documentation

## 2017-04-18 DIAGNOSIS — Z7982 Long term (current) use of aspirin: Secondary | ICD-10-CM | POA: Insufficient documentation

## 2017-04-18 DIAGNOSIS — I10 Essential (primary) hypertension: Secondary | ICD-10-CM | POA: Diagnosis not present

## 2017-04-18 DIAGNOSIS — Z951 Presence of aortocoronary bypass graft: Secondary | ICD-10-CM | POA: Diagnosis not present

## 2017-04-18 DIAGNOSIS — Q245 Malformation of coronary vessels: Secondary | ICD-10-CM | POA: Insufficient documentation

## 2017-04-18 DIAGNOSIS — E784 Other hyperlipidemia: Secondary | ICD-10-CM | POA: Diagnosis not present

## 2017-04-18 DIAGNOSIS — M722 Plantar fascial fibromatosis: Secondary | ICD-10-CM | POA: Insufficient documentation

## 2017-04-18 DIAGNOSIS — R079 Chest pain, unspecified: Secondary | ICD-10-CM | POA: Insufficient documentation

## 2017-04-18 DIAGNOSIS — I6523 Occlusion and stenosis of bilateral carotid arteries: Secondary | ICD-10-CM

## 2017-04-18 DIAGNOSIS — Z79899 Other long term (current) drug therapy: Secondary | ICD-10-CM | POA: Diagnosis not present

## 2017-04-18 DIAGNOSIS — E785 Hyperlipidemia, unspecified: Secondary | ICD-10-CM | POA: Insufficient documentation

## 2017-04-18 DIAGNOSIS — I6529 Occlusion and stenosis of unspecified carotid artery: Secondary | ICD-10-CM | POA: Diagnosis not present

## 2017-04-18 DIAGNOSIS — I2510 Atherosclerotic heart disease of native coronary artery without angina pectoris: Secondary | ICD-10-CM | POA: Diagnosis not present

## 2017-04-18 DIAGNOSIS — E7849 Other hyperlipidemia: Secondary | ICD-10-CM

## 2017-04-18 LAB — COMPREHENSIVE METABOLIC PANEL
ALT: 17 U/L (ref 17–63)
AST: 23 U/L (ref 15–41)
Albumin: 4 g/dL (ref 3.5–5.0)
Alkaline Phosphatase: 48 U/L (ref 38–126)
Anion gap: 9 (ref 5–15)
BUN: 12 mg/dL (ref 6–20)
CO2: 24 mmol/L (ref 22–32)
Calcium: 9.4 mg/dL (ref 8.9–10.3)
Chloride: 107 mmol/L (ref 101–111)
Creatinine, Ser: 0.87 mg/dL (ref 0.61–1.24)
GFR calc Af Amer: >60 mL/min (ref >60)
GFR calc non Af Amer: >60 mL/min (ref >60)
Glucose, Bld: 99 mg/dL (ref 65–99)
Potassium: 4.1 mmol/L (ref 3.5–5.1)
Sodium: 140 mmol/L (ref 135–145)
Total Bilirubin: 1.7 mg/dL — ABNORMAL HIGH (ref 0.3–1.2)
Total Protein: 6.8 g/dL (ref 6.5–8.1)

## 2017-04-18 LAB — CHOLESTEROL, TOTAL: Cholesterol: 150 mg/dL (ref 0–200)

## 2017-04-18 NOTE — Progress Notes (Signed)
Patient ID: [name], male   DOB: 09/22/1940, 76 y.o.   MRN: [id number]    Advanced Heart Failure Clinic Note   PCP: Dr Rosezetta SchlatterBurnette Primary Cardiologist: Dr. Gala RomneyBensimhon   HPI:  [name] is a very pleasant 76 y.o. male previously followed by Dr. Graceann CongressJoseph [place].  He has a history of CAD, multiple angioplasties to the RCA dating back to 1983 followed by CABG in 2012, HTN, and hyperlipidemia.  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  He was hospitalized in 11/11 because of recurrent chest pain. He underwent drug-eluting stent to the proximal RCA July 24, 2010. Ejection fraction was 65%. He also had 99% stenosis in the distal PDA with left to right collaterals and nonobstructive disease in the left coronary tree.  [place]. Found to have progressive CAD. Underwent Coronary artery bypass grafting x4 (left internal mammary artery to LAD, saphenous vein graft to circumflex marginal, saphenous vein graft to posterior descending branch of the distal right coronary, saphenous vein graft to the posterolateral branch of the distal right coronary artery).   Carotid u/s 2/12: 40-59% bilat (stable) Cartoid u/s 3/12: LICA 60-79%, RICA 40-59%  Carotid u/s 9/13 40-59% bilaterally  Carotid u/s 1/16 40-59% bilaterally  Carotid u/s 3/17 RICA 60-79%, LICA 40-59% - [facility]s with [facility] in 5/18: 5/18 RICA 40-59% and LICA 1-39%  11/14/12 ABI This led to a noninvasive lower extremity arterial study. This demonstrated noncompressible tibial arteries. His ABIs were 1.1 on the right and 1.0 on the left. His toe brachial indices were 0.77 on the right and 0.63 on the left. Waveforms were triphasic. Seen by Dr. Excell Seltzerooper and felt he was OK.   Previously suggested to increased Atorvastatin to 80 mg daily but previously had elevated LFTs on 80mg  daily. Has tried Crestor in past and felt it was not effective (numbers went up). Was seen in  Lipid Clinic and niaspan added to help bring numbers down.  Stopped Niaspan after AIM-HIGH trial. No longer followed in Lipid Clinic. Tolerating atorva 80 well since 2014   He presents today for yearly follow up. Last Carotid US with 60-79% RICA stenosis, 40-59% LICA.  Referred to [facility], watchful waiting for now. Had repeat u/s at [facility] with Dr. Myra GianottiBrabham in 5/18 40-59% RICA and 1-39% LICA. Continues to feel well. [facility] [place] [employer] several times per week. Does treadmill and bike. Some weights. Playing golf 2-3x/week. Unable to walk course due to back pain and left plantar fascittis. Denies any SOB. No CP. SBP remains 130-140 range   Lab Results  Component Value Date   CHOL 176 05/03/2016   HDL 46 05/03/2016   LDLCALC 116 (H) 05/03/2016   TRIG 69 05/03/2016   CHOLHDL 3.8 05/03/2016    ROS: All systems negative except as listed in HPI, PMH and Problem List.  Past Medical History:  Diagnosis Date  . CAD (coronary artery disease)    s/p multiple PCIs to RCA dating back to 61983. Last stent to proximal RCA in November of 2011. Also had 99% stenosis in the distal PDA with left to right collaterals and nonobstructive disease in the left coronary tree. ;  ISR of RCA 10/12 - CABG 10/9 with Dr. Zenaida NieceVan Trigt: L-LAD, S-OM, S-PDA  . 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 (stable)  . HTN (hypertension)   . Hyperlipidemia     Current Outpatient Prescriptions  Medication Sig Dispense  Refill  . acetaminophen (TYLENOL) 325 MG tablet Take 650 mg by mouth every 6 (six) hours as needed for mild pain or headache.    Marland Kitchen amLODipine (NORVASC) 10 MG tablet TAKE 1 TABLET ONCE DAILY. 90 tablet 2  . amoxicillin (AMOXIL) 250 MG capsule Take 250 mg by mouth as directed. 4 tablets 1 hour prior to having dental procedures    . aspirin 81 MG tablet Take 81 mg by mouth daily.    Marland Kitchen atorvastatin (LIPITOR) 80 MG tablet TAKE 1 TABLET ONCE DAILY. 90 tablet 0  . ezetimibe (ZETIA) 10 MG tablet TAKE 1 TABLET DAILY. 90  tablet 0  . losartan (COZAAR) 100 MG tablet TAKE 1 TABLET DAILY. 90 tablet 3  . NITROSTAT 0.4 MG SL tablet 1 TAB UNDER TONGUE AS NEEDED FOR CHEST PAIN. MAY REPEAT EVERY 5 MIN FOR A TOTAL OF 3 DOSES. 25 tablet 0  . Omega-3 Fatty Acids (FISH OIL DOUBLE STRENGTH) 1200 MG CAPS Take 2 capsules by mouth daily.      Marland Kitchen spironolactone (ALDACTONE) 25 MG tablet TAKE (1/2) TABLET DAILY. 45 tablet 3  . Tamsulosin HCl (FLOMAX) 0.4 MG CAPS Take 0.8 mg by mouth daily.      No current facility-administered medications for this encounter.      PHYSICAL EXAM: Vitals:   04/18/17 1008  BP: 138/62  Pulse: 68  SpO2: 97%  Weight: 201 lb (91.2 kg)    General:  Well appearing. No resp difficulty HEENT: normal Neck: supple. JVP flat. Carotids 2+ bilaterally; R bruit. No thyromegaly or nodule noted.  Cor: PMI nondisplaced. Regular rate & rhythm. No rubs, gallops or murmurs. Lungs: clear Abdomen: soft, nontender, nondistended. No hepatosplenomegaly. No bruits or masses. Good bowel sounds. Extremities: no cyanosis, clubbing, rash, edema Neuro: alert & oriented x 3, cranial nerves grossly intact. moves all 4 extremities w/o difficulty. Affect pleasant  ECG: NSR 62 bpm possible limb lead reversal No ST-T wave abnormalities.  Personally reviewed  ASSESSMENT & PLAN: 1. CAD  S/p CABG 2012 - Doing well but functional capacity limited a bit by plantar fasciitis and back pain  - Will proceed with ETT - Continue statin and ASA. Unable to toelrate b-blocker due to fatigue and bradycardia 2. HTN - BP reasonable controlled - Continue losartan 100 mg daily and amlodipine 10 mg daily.  - Encouraged him to follow BP a bit more closely. If SBP creeps up over 140 consistently will add low-dose spiro.  3. Carotid stenosis - Remains asx.. Established with Dr. Myra Gianotti now.   - Recent results reviewed with him.  4. HL - repeat lipids and CMET today. Goal LDL < 70 - Continue atorva 80   [name] Dambrosio,MD 11:05  AM

## 2017-04-18 NOTE — Patient Instructions (Signed)
Labs today (will call for abnormal results, otherwise no news is good news)  Treadmill stress test has been ordered for you.  We will schedule this at checkout.   Follow up in 1 year, please call us to schedule your appointment.

## 2017-04-19 ENCOUNTER — Telehealth (HOSPITAL_COMMUNITY): Payer: Self-pay | Admitting: *Deleted

## 2017-04-19 DIAGNOSIS — E7849 Other hyperlipidemia: Secondary | ICD-10-CM

## 2017-04-19 NOTE — Telephone Encounter (Signed)
Cholesterol, Total  Order: [id number]  Status:  Final result Visible to patient:  No (Not Released) Dx:  Other hyperlipidemia  Notes recorded by Georgina PeerFarver, Brette Cast S, RN on 04/19/2017 at 12:30 PM EDT Patient called back and he is agreeable. Patient scheduled for tomorrow and is aware of nothing to eat or drink after midnight tonight. Lab order placed. ------  Notes recorded by Georgina PeerFarver, Ellerie Arenz S, RN on 04/19/2017 at 12:12 PM EDT Called and left message on both numbers listed asking for him to call us back. ------  Notes recorded by Dolores PattyBensimhon, Daniel R, MD on 04/18/2017 at 9:40 PM EDT Hey. Please get full fasting lipid profile. Thanks

## 2017-04-20 ENCOUNTER — Ambulatory Visit (HOSPITAL_COMMUNITY)
Admission: RE | Admit: 2017-04-20 | Discharge: 2017-04-20 | Disposition: A | Discharge status: Home / Self Care | Payer: Medicare HMO | Source: Ambulatory Visit | Attending: Cardiology | Admitting: Cardiology

## 2017-04-20 DIAGNOSIS — E784 Other hyperlipidemia: Secondary | ICD-10-CM | POA: Diagnosis not present

## 2017-04-20 DIAGNOSIS — E7849 Other hyperlipidemia: Secondary | ICD-10-CM

## 2017-04-20 LAB — LIPID PANEL
Cholesterol: 152 mg/dL (ref 0–200)
HDL: 39 mg/dL — ABNORMAL LOW (ref >40)
LDL Cholesterol: 96 mg/dL (ref 0–99)
Total CHOL/HDL Ratio: 3.9 RATIO
Triglycerides: 84 mg/dL (ref <150)
VLDL: 17 mg/dL (ref 0–40)

## 2017-04-27 ENCOUNTER — Ambulatory Visit (INDEPENDENT_AMBULATORY_CARE_PROVIDER_SITE_OTHER): Payer: Medicare HMO

## 2017-04-27 DIAGNOSIS — I2510 Atherosclerotic heart disease of native coronary artery without angina pectoris: Secondary | ICD-10-CM

## 2017-04-27 LAB — EXERCISE TOLERANCE TEST
Estimated workload: 7 METS
Exercise duration (min): 5 min
Exercise duration (sec): 41 s
MPHR: 146 {beats}/min
Peak HR: 126 {beats}/min
Percent HR: 86 %
RPE: 14
Rest HR: 70 {beats}/min

## 2017-05-19 ENCOUNTER — Telehealth (HOSPITAL_COMMUNITY): Payer: Self-pay

## 2017-05-19 ENCOUNTER — Encounter (HOSPITAL_COMMUNITY): Payer: Self-pay | Admitting: *Deleted

## 2017-05-19 NOTE — Telephone Encounter (Signed)
Exercise Tolerance Test  Order: [id number]  Status:  Final result Visible to patient:  No (Not Released) Dx:  Atherosclerosis of native coronary ar...  Notes recorded by Chyrl CivatteBradley, Federico Maiorino G, RN on 05/19/2017 at 2:16 PM EDT Patient returned call and left VM on CHF triage line returning call. Attempted to call patient back on both lines, no answer, LVMTCB. ------  Notes recorded by Noralee SpaceSchub, Heather M, RN on 05/19/2017 at 12:12 PM EDT Still unable to reach pt, Left message to call back, mailed letter for pt to call us back as well ------  Notes recorded by Georgina PeerFarver, Pamela S, RN on 05/12/2017 at 4:54 PM EDT Called and left message to call us back. ------  Notes recorded by Noralee SpaceSchub, Heather M, RN on 05/04/2017 at 2:22 PM EDT Left message to call back ------  Notes recorded by Bensimhon, Bevelyn Bucklesaniel R, MD on 04/29/2017 at 8:24 PM EDT No ischemia. Please let him know.

## 2017-06-22 DIAGNOSIS — M1990 Unspecified osteoarthritis, unspecified site: Secondary | ICD-10-CM | POA: Diagnosis not present

## 2017-06-22 DIAGNOSIS — Z125 Encounter for screening for malignant neoplasm of prostate: Secondary | ICD-10-CM | POA: Diagnosis not present

## 2017-06-22 DIAGNOSIS — Z1389 Encounter for screening for other disorder: Secondary | ICD-10-CM | POA: Diagnosis not present

## 2017-06-22 DIAGNOSIS — Z6830 Body mass index (BMI) 30.0-30.9, adult: Secondary | ICD-10-CM | POA: Diagnosis not present

## 2017-06-22 DIAGNOSIS — I10 Essential (primary) hypertension: Secondary | ICD-10-CM | POA: Diagnosis not present

## 2017-06-22 DIAGNOSIS — E7800 Pure hypercholesterolemia, unspecified: Secondary | ICD-10-CM | POA: Insufficient documentation

## 2017-06-22 DIAGNOSIS — I119 Hypertensive heart disease without heart failure: Secondary | ICD-10-CM | POA: Insufficient documentation

## 2017-06-22 DIAGNOSIS — H9193 Unspecified hearing loss, bilateral: Secondary | ICD-10-CM | POA: Diagnosis not present

## 2017-06-22 DIAGNOSIS — I2510 Atherosclerotic heart disease of native coronary artery without angina pectoris: Secondary | ICD-10-CM | POA: Diagnosis not present

## 2017-06-22 DIAGNOSIS — M722 Plantar fascial fibromatosis: Secondary | ICD-10-CM | POA: Diagnosis not present

## 2017-06-22 DIAGNOSIS — E668 Other obesity: Secondary | ICD-10-CM | POA: Diagnosis not present

## 2017-06-23 ENCOUNTER — Other Ambulatory Visit (HOSPITAL_COMMUNITY): Payer: Self-pay | Admitting: Internal Medicine

## 2017-06-24 DIAGNOSIS — R69 Illness, unspecified: Secondary | ICD-10-CM | POA: Diagnosis not present

## 2017-07-01 ENCOUNTER — Other Ambulatory Visit (HOSPITAL_COMMUNITY): Payer: Self-pay | Admitting: Internal Medicine

## 2017-07-12 ENCOUNTER — Other Ambulatory Visit (HOSPITAL_COMMUNITY): Payer: Self-pay | Admitting: Internal Medicine

## 2017-07-29 DIAGNOSIS — H2513 Age-related nuclear cataract, bilateral: Secondary | ICD-10-CM | POA: Diagnosis not present

## 2017-07-29 DIAGNOSIS — H5213 Myopia, bilateral: Secondary | ICD-10-CM | POA: Diagnosis not present

## 2017-07-30 ENCOUNTER — Other Ambulatory Visit: Payer: Self-pay | Admitting: Cardiology

## 2017-08-16 ENCOUNTER — Other Ambulatory Visit (HOSPITAL_COMMUNITY): Payer: Self-pay | Admitting: Internal Medicine

## 2017-10-11 ENCOUNTER — Other Ambulatory Visit (HOSPITAL_COMMUNITY): Payer: Self-pay | Admitting: Internal Medicine

## 2017-11-03 DIAGNOSIS — R69 Illness, unspecified: Secondary | ICD-10-CM | POA: Diagnosis not present

## 2018-01-10 ENCOUNTER — Other Ambulatory Visit: Payer: Self-pay | Admitting: Cardiology

## 2018-01-23 DIAGNOSIS — R69 Illness, unspecified: Secondary | ICD-10-CM | POA: Diagnosis not present

## 2018-01-30 ENCOUNTER — Ambulatory Visit: Payer: Medicare HMO | Admitting: Family

## 2018-01-30 ENCOUNTER — Ambulatory Visit (HOSPITAL_COMMUNITY)
Admission: RE | Admit: 2018-01-30 | Discharge: 2018-01-30 | Disposition: A | Discharge status: Home / Self Care | Payer: Medicare HMO | Source: Ambulatory Visit | Attending: Family | Admitting: Family

## 2018-01-30 ENCOUNTER — Encounter: Payer: Self-pay | Admitting: Family

## 2018-01-30 VITALS — BP 167/72 | HR 63 | Temp 97.6°F | Resp 18 | Ht 67.0 in | Wt 193.6 lb

## 2018-01-30 DIAGNOSIS — I6523 Occlusion and stenosis of bilateral carotid arteries: Secondary | ICD-10-CM | POA: Insufficient documentation

## 2018-01-30 NOTE — Patient Instructions (Signed)

## 2018-01-30 NOTE — Progress Notes (Signed)
Chief Complaint: Follow up Extracranial Carotid Artery Stenosis   History of Present Illness  [name] is a 77 y.o. male who was referred by Dr. 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  Dr. Myra Gianotti evaluated pt initially in April of 2017. The patient had been followed with ultrasound for carotid stenosis, measuring 60-79% stenosis. The left was stable at 40-59% stenosis.  He denies any known history of stroke or TIA. Specifically he deniesa history of amaurosis fugax or monocular blindness, unilateral facial drooping, hemiplegia, orreceptive or expressive aphasia.    The patient has a history of coronary artery disease. He is status post CABG in 2012.  He has been evaluated in the past by Dr. Excell Seltzer for claudication. His ABIs were normal.  He continues to participate and regular activity at the [facility], 3-4 days/week, works in his yard, plays golf.  He states his blood pressure at home runs 140's/60's.   His cardiologist is Dr. Clarise Cruz, states he is due for another visit this [name], states his cardiologist prescribes his blood pressure medications.    He denies claudication symptoms with walking, but does report right gluteal and low back pain with walking, relieved by rest. Pt states that has been evaluated, states this is muscle spasms, states he has a tight tendon issue in his hip, and plantar fasciitis in left foot.   Pt Diabetic: no Pt smoker: non-smoker  Pt meds include: Statin : yes ASA: yes Other anticoagulants/antiplatelets: no    Past Medical History:  Diagnosis Date  . CAD (coronary artery disease)    s/p multiple PCIs to RCA dating back to 63. Last stent to proximal RCA in November of 2011. Also had 99% stenosis in the distal PDA with left to right collaterals and nonobstructive disease in the left coronary tree. ;  ISR of RCA 10/12 - CABG 10/9 with Dr. Zenaida Niece Trigt: L-LAD, S-OM, S-PDA  . Carotid bruit    carotid u/s 2/12: 40-59%  bilaterally. (stable)  . HTN (hypertension)   . Hyperlipidemia     Social History Social History   Tobacco Use  . Smoking status: Never Smoker  . Smokeless tobacco: Never Used  Substance Use Topics  . Alcohol use: Yes    Alcohol/week: 3.6 oz    Types: 6 Cans of beer per week  . Drug use: No    Family History Family History  Problem Relation Age of Onset  . Heart disease Father   . Colon cancer Neg Hx   . Stomach cancer Neg Hx     Surgical History Past Surgical History:  Procedure Laterality Date  . Coronary artery bypass grafting x4 (left internal mammary artery  06/23/2011  . CORONARY STENT PLACEMENT  Nov 2011   prox RCA. Has had prior multiple procedures.  Marland Kitchen left total hip replacement  2008  . TOOTH EXTRACTION  06/2013    Allergies  Allergen Reactions  . Morphine Nausea Only    Current Outpatient Medications  Medication Sig Dispense Refill  . acetaminophen (TYLENOL) 325 MG tablet Take 650 mg by mouth every 6 (six) hours as needed for mild pain or headache.    Marland Kitchen amLODipine (NORVASC) 10 MG tablet TAKE 1 TABLET ONCE DAILY. 90 tablet 3  . amoxicillin (AMOXIL) 250 MG capsule Take 250 mg by mouth as directed. 4 tablets 1 hour prior to having dental procedures    . aspirin 81 MG tablet Take 81 mg by mouth daily.    Marland Kitchen atorvastatin (LIPITOR) 80 MG tablet  TAKE 1 TABLET ONCE DAILY. 90 tablet 3  . ezetimibe (ZETIA) 10 MG tablet TAKE 1 TABLET BY MOUTH DAILY. 90 tablet 0  . losartan (COZAAR) 100 MG tablet TAKE 1 TABLET BY MOUTH DAILY. 90 tablet 3  . NITROSTAT 0.4 MG SL tablet 1 TAB UNDER TONGUE AS NEEDED FOR CHEST PAIN. MAY REPEAT EVERY 5 MIN FOR A TOTAL OF 3 DOSES. 25 tablet 0  . Omega-3 Fatty Acids (FISH OIL DOUBLE STRENGTH) 1200 MG CAPS Take 2 capsules by mouth daily.      Marland Kitchen spironolactone (ALDACTONE) 25 MG tablet TAKE (1/2) TABLET DAILY. 45 tablet 3  . Tamsulosin HCl (FLOMAX) 0.4 MG CAPS Take 0.8 mg by mouth daily.      No current facility-administered medications for  this visit.     Review of Systems : See HPI for pertinent positives and negatives.  Physical Examination  Vitals:   01/30/18 1038 01/30/18 1040  BP: (!) 172/67 (!) 167/72  Pulse: 63   Resp: 18   Temp: 97.6 F (36.4 C)   TempSrc: Oral   SpO2: 96%   Weight: 193 lb 9.6 oz (87.8 kg)   Height:  (1.702 m)    Body mass index is 30.32 kg/m.  General: General: WDWN obese male in NAD GAIT: normal HENT: no gross abnormalities  Eyes: PERRLA Pulmonary:  Respirations are non-labored, good air movement in all fields, CTAB, no rales, rhonchi, or wheezing. Cardiac: regular rhythm, no detected murmur.  VASCULAR EXAM Carotid Bruits Right Left   positive Negative    Abdominal aortic pulse is not  palpable. Radial pulses are 2+ palpable and equal.                                                                                                                                          LE Pulses Right Left       POPLITEAL  not palpable   not palpable       POSTERIOR TIBIAL  not palpable   not palpable        DORSALIS PEDIS      ANTERIOR TIBIAL 2+ palpable  1+ palpable     Gastrointestinal: soft, nontender, BS WNL, no r/g, no palpable masses. Musculoskeletal: No muscle atrophy/wasting. M/S 5/5 throughout, extremities without ischemic changes. Skin: No rashes, no ulcers, no cellulitis.   Neurologic:  A&O X 3; appropriate affect, sensation is normal; speech is normal, CN 2-12 intact, pain and light touch intact in extremities, motor exam as listed above. Psychiatric: Normal thought content, mood appropriate to clinical situation.    Assessment: [name] is a 77 y.o. male who who has a history of CAD and a carotid bruit, but no history of stroke or TIA.   Fortunately he does not have DM, has never used tobacco, and stays very active.  He takes a daily statin and ASA.   DATA Carotid  Duplex (01-30-18): 40-59% stenosis in the bilateral ICA. >50% stenosis in the  right distal CCA (278 cm/s) Right ECA: >50% stenosis Bilateral vertebral artery flow is antegrade.  Bilateral subclavian artery waveforms are normal.  Increased stenosis in the left ICA compared to the exam on 01-18-17, but comparable to the stenosis noted by [facility] on 12-01-15.     Plan: Follow-up in 1 year with Carotid Duplex scan.    I discussed in depth with the patient the nature of atherosclerosis, and emphasized the importance of maximal medical management including strict control of blood pressure, blood glucose, and lipid levels, obtaining regular exercise, and continued cessation of smoking.  The patient is aware that without maximal medical management the underlying atherosclerotic disease process will progress, limiting the benefit of any interventions. The patient was given information about stroke prevention and what symptoms should prompt the patient to seek immediate medical care. Thank you for allowing [place] to participate in this patient's care.  Charisse March, RN, MSN, FNP-C [facility] Office: [phone]  Clinic Physician: Myra Gianotti  01/30/18 10:43 AM

## 2018-02-19 ENCOUNTER — Other Ambulatory Visit (HOSPITAL_COMMUNITY): Payer: Self-pay | Admitting: Internal Medicine

## 2018-04-10 ENCOUNTER — Other Ambulatory Visit (HOSPITAL_COMMUNITY): Payer: Self-pay | Admitting: Internal Medicine

## 2018-05-18 ENCOUNTER — Encounter (HOSPITAL_COMMUNITY): Payer: Medicare HMO | Admitting: Internal Medicine

## 2018-05-26 ENCOUNTER — Other Ambulatory Visit (HOSPITAL_COMMUNITY): Payer: Self-pay | Admitting: Internal Medicine

## 2018-06-19 DIAGNOSIS — R82998 Other abnormal findings in urine: Secondary | ICD-10-CM | POA: Diagnosis not present

## 2018-06-19 DIAGNOSIS — Z0000 Encounter for general adult medical examination without abnormal findings: Secondary | ICD-10-CM | POA: Insufficient documentation

## 2018-06-19 DIAGNOSIS — R69 Illness, unspecified: Secondary | ICD-10-CM | POA: Diagnosis not present

## 2018-06-19 DIAGNOSIS — I10 Essential (primary) hypertension: Secondary | ICD-10-CM | POA: Diagnosis not present

## 2018-06-19 DIAGNOSIS — E7800 Pure hypercholesterolemia, unspecified: Secondary | ICD-10-CM | POA: Diagnosis not present

## 2018-06-19 DIAGNOSIS — Z125 Encounter for screening for malignant neoplasm of prostate: Secondary | ICD-10-CM | POA: Diagnosis not present

## 2018-06-29 DIAGNOSIS — Z23 Encounter for immunization: Secondary | ICD-10-CM | POA: Diagnosis not present

## 2018-06-29 DIAGNOSIS — Z125 Encounter for screening for malignant neoplasm of prostate: Secondary | ICD-10-CM | POA: Diagnosis not present

## 2018-06-29 DIAGNOSIS — E7800 Pure hypercholesterolemia, unspecified: Secondary | ICD-10-CM | POA: Diagnosis not present

## 2018-06-29 DIAGNOSIS — N401 Enlarged prostate with lower urinary tract symptoms: Secondary | ICD-10-CM | POA: Diagnosis not present

## 2018-06-29 DIAGNOSIS — I2510 Atherosclerotic heart disease of native coronary artery without angina pectoris: Secondary | ICD-10-CM | POA: Diagnosis not present

## 2018-06-29 DIAGNOSIS — M722 Plantar fascial fibromatosis: Secondary | ICD-10-CM | POA: Diagnosis not present

## 2018-06-29 DIAGNOSIS — M1990 Unspecified osteoarthritis, unspecified site: Secondary | ICD-10-CM | POA: Diagnosis not present

## 2018-06-29 DIAGNOSIS — Z0000 Encounter for general adult medical examination without abnormal findings: Secondary | ICD-10-CM | POA: Diagnosis not present

## 2018-06-29 DIAGNOSIS — H6123 Impacted cerumen, bilateral: Secondary | ICD-10-CM | POA: Diagnosis not present

## 2018-06-29 DIAGNOSIS — I10 Essential (primary) hypertension: Secondary | ICD-10-CM | POA: Diagnosis not present

## 2018-06-29 DIAGNOSIS — E668 Other obesity: Secondary | ICD-10-CM | POA: Diagnosis not present

## 2018-07-03 ENCOUNTER — Ambulatory Visit (HOSPITAL_COMMUNITY)
Admission: RE | Admit: 2018-07-03 | Discharge: 2018-07-03 | Disposition: A | Discharge status: Home / Self Care | Payer: Medicare HMO | Source: Ambulatory Visit | Attending: Internal Medicine | Admitting: Internal Medicine

## 2018-07-03 ENCOUNTER — Other Ambulatory Visit: Payer: Self-pay

## 2018-07-03 ENCOUNTER — Encounter (HOSPITAL_COMMUNITY): Payer: Self-pay | Admitting: Internal Medicine

## 2018-07-03 VITALS — BP 151/59 | HR 73 | Wt 191.6 lb

## 2018-07-03 DIAGNOSIS — I2510 Atherosclerotic heart disease of native coronary artery without angina pectoris: Secondary | ICD-10-CM | POA: Insufficient documentation

## 2018-07-03 DIAGNOSIS — E7849 Other hyperlipidemia: Secondary | ICD-10-CM | POA: Diagnosis not present

## 2018-07-03 DIAGNOSIS — E785 Hyperlipidemia, unspecified: Secondary | ICD-10-CM | POA: Diagnosis not present

## 2018-07-03 DIAGNOSIS — R001 Bradycardia, unspecified: Secondary | ICD-10-CM | POA: Diagnosis not present

## 2018-07-03 DIAGNOSIS — Z951 Presence of aortocoronary bypass graft: Secondary | ICD-10-CM | POA: Insufficient documentation

## 2018-07-03 DIAGNOSIS — M549 Dorsalgia, unspecified: Secondary | ICD-10-CM | POA: Diagnosis not present

## 2018-07-03 DIAGNOSIS — I6523 Occlusion and stenosis of bilateral carotid arteries: Secondary | ICD-10-CM | POA: Insufficient documentation

## 2018-07-03 DIAGNOSIS — M722 Plantar fascial fibromatosis: Secondary | ICD-10-CM | POA: Insufficient documentation

## 2018-07-03 DIAGNOSIS — I10 Essential (primary) hypertension: Secondary | ICD-10-CM | POA: Insufficient documentation

## 2018-07-03 DIAGNOSIS — Z79899 Other long term (current) drug therapy: Secondary | ICD-10-CM | POA: Insufficient documentation

## 2018-07-03 DIAGNOSIS — R5383 Other fatigue: Secondary | ICD-10-CM | POA: Insufficient documentation

## 2018-07-03 DIAGNOSIS — Z7982 Long term (current) use of aspirin: Secondary | ICD-10-CM | POA: Insufficient documentation

## 2018-07-03 NOTE — Patient Instructions (Signed)
We will contact you in 1 year to schedule your next appointment.  

## 2018-07-03 NOTE — Progress Notes (Signed)
Patient ID: [name], male   DOB: 1941-07-26, 77 y.o.   MRN: [id number]    Advanced Heart Failure Clinic Note   PCP: Dr Rosezetta Schlatter Primary Cardiologist: Dr. Gala [name]   HPI:  [name] is a 77 y.o. male previously followed by Dr. Graceann [name].  He has a history of CAD, multiple angioplasties to the RCA dating back to 1983 followed by CABG in 2012, HTN, and hyperlipidemia.  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  He was hospitalized in 11/11 because of recurrent chest pain. He underwent drug-eluting stent to the proximal RCA July 24, 2010. Ejection fraction was 65%. He also had 99% stenosis in the distal PDA with left to right collaterals and nonobstructive disease in the left coronary tree.  Admitted in October 2012 with [place]. Found to have progressive CAD. Underwent Coronary artery bypass grafting x4 (left internal mammary artery to LAD, saphenous vein graft to circumflex marginal, saphenous vein graft to posterior descending branch of the distal right coronary, saphenous vein graft to the posterolateral branch of the distal right coronary artery).   Carotid u/s 2/12: 40-59% bilat (stable) Cartoid u/s 3/12: LICA 60-79%, RICA 40-59%  Carotid u/s 9/13 40-59% bilaterally  Carotid u/s 1/16 40-59% bilaterally  Carotid u/s 3/17 RICA 60-79%, LICA 40-59% - [facility] with [facility] in 5/18: 5/18 RICA 40-59% and LICA 1-39%  11/14/12 ABI This led to a noninvasive lower extremity arterial study. This demonstrated noncompressible tibial arteries. His ABIs were 1.1 on the right and 1.0 on the left. His toe brachial indices were 0.77 on the right and 0.63 on the left. Waveforms were triphasic. Seen by Dr. Excell Seltzer and felt he was OK.   Previously suggested to increased Atorvastatin to 80 mg daily but previously had elevated LFTs on 80mg  daily. Has tried Crestor in past and felt it was not effective (numbers went up). Was seen in [facility] and niaspan added to help bring numbers down.  Stopped Niaspan after AIM-HIGH trial. No longer followed in Lipid Clinic. Tolerating atorva 80 well since 2014   He presents today for yearly follow up. Feeling good overall. Continues to exercise multiple times weekly and plays golf. Denies lightheadedness, dizziness, or SOB.  Mostly limited from back pain and left plantar fasciitis. BPs at home run in upper 130s . Saw his PCP for yearly last week and BP 138/70. Labs drawn. Had Cholesterol checked. LDL still running slightly high. Watching salt and fluid intake. Denies peripheral edema, orthopnea, or PND.   Lab Results  Component Value Date   CHOL 152 04/20/2017   HDL 39 (L) 04/20/2017   LDLCALC 96 04/20/2017   TRIG 84 04/20/2017   CHOLHDL 3.9 04/20/2017   Review of systems complete and found to be negative unless listed in HPI.    Past Medical History:  Diagnosis Date  . CAD (coronary artery disease)    s/p multiple PCIs to RCA dating back to 36. Last stent to proximal RCA in November of 2011. Also had 99% stenosis in the distal PDA with left to right collaterals and nonobstructive disease in the left coronary tree. ;  ISR of RCA 10/12 - CABG 10/9 with Dr. Zenaida Niece [name]: L-LAD, S-OM, S-PDA  . 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 (stable)  . HTN (hypertension)   . Hyperlipidemia     Current Outpatient Medications  Medication Sig Dispense Refill  . acetaminophen (TYLENOL) 325  MG tablet Take 650 mg by mouth every 6 (six) hours as needed for mild pain or headache.    Marland Kitchen amLODipine (NORVASC) 10 MG tablet TAKE 1 TABLET ONCE DAILY. 90 tablet 3  . amoxicillin (AMOXIL) 250 MG capsule Take 250 mg by mouth as directed. 4 tablets 1 hour prior to having dental procedures    . aspirin 81 MG tablet Take 81 mg by mouth daily.    Marland Kitchen atorvastatin (LIPITOR) 80 MG tablet TAKE 1 TABLET ONCE DAILY. 90 tablet 3  . ezetimibe (ZETIA) 10 MG tablet TAKE 1 TABLET BY MOUTH DAILY. 90 tablet 0  .  losartan (COZAAR) 100 MG tablet TAKE 1 TABLET BY MOUTH DAILY. 90 tablet 3  . NITROSTAT 0.4 MG SL tablet 1 TAB UNDER TONGUE AS NEEDED FOR CHEST PAIN. MAY REPEAT EVERY 5 MIN FOR A TOTAL OF 3 DOSES. 25 tablet 0  . Omega-3 Fatty Acids (FISH OIL DOUBLE STRENGTH) 1200 MG CAPS Take 2 capsules by mouth daily.      Marland Kitchen spironolactone (ALDACTONE) 25 MG tablet TAKE (1/2) TABLET DAILY. 45 tablet 0  . Tamsulosin HCl (FLOMAX) 0.4 MG CAPS Take 0.8 mg by mouth daily.      No current facility-administered medications for this encounter.    Vitals:   07/03/18 0919  BP: (!) 151/59  Pulse: 73  SpO2: 98%  Weight: 86.9 kg (191 lb 9.6 oz)   Wt Readings from Last 3 Encounters:  07/03/18 86.9 kg (191 lb 9.6 oz)  01/30/18 87.8 kg (193 lb 9.6 oz)  04/18/17 91.2 kg (201 lb)   PHYSICAL EXAM: General: Well appearing. No resp difficulty. HEENT: Normal Neck: Supple. JVP 5-6. Carotids 2+ bilat; no bruits. No thyromegaly or nodule noted. Cor: PMI nondisplaced. RRR, No M/G/R noted Lungs: CTAB, normal effort. Abdomen: Soft, non-tender, non-distended, no HSM. No bruits or masses. +BS  Extremities: No cyanosis, clubbing, or rash. R and LLE no edema.  Neuro: Alert & orientedx3, cranial nerves grossly intact. moves all 4 extremities w/o difficulty. Affect pleasant   ECG: NSR 68, personally reviewed.   ASSESSMENT & PLAN: 1. CAD  S/p CABG 2012 - Doing well but functional capacity limited a bit by plantar fasciitis and back pain  - ETT 04/2017 with no ischemia.  - Continue statin and ASA. Unable to toelrate b-blocker due to fatigue and bradycardia 2. HTN - BP elevated on arrival  - Continue losartan 100 mg daily and amlodipine 10 mg daily.  - Consider spiro, pt would like to hold off. Will discuss with Dr. 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 CCA with >50% hemodynamically significant plaque.  - Remains asymptomatic. Established with Dr. Myra Gianotti now.   4. HL - Repeat lipids and  CMET today. Goal LDL < 70. ( 96 04/20/2017).  - Will await labwork from PCP.  - Continue atorva 80  BP elevated on arrival, but run in 130s and stable at home. Pt prefers to keep medical regiment the same. Instructed if BP remains above 140, or if he obtains readings in the 150s to call our clinic for further.   Graciella Freer, PA-C  9:32 AM  Greater than 50% of the 30 minute visit was spent in counseling/coordination of care regarding disease state education, salt/fluid restriction, sliding scale diuretics, and medication compliance.

## 2018-07-07 ENCOUNTER — Other Ambulatory Visit (HOSPITAL_COMMUNITY): Payer: Self-pay | Admitting: Internal Medicine

## 2018-07-11 ENCOUNTER — Other Ambulatory Visit (HOSPITAL_COMMUNITY): Payer: Self-pay | Admitting: Internal Medicine

## 2018-07-17 ENCOUNTER — Other Ambulatory Visit (HOSPITAL_COMMUNITY): Payer: Self-pay | Admitting: Internal Medicine

## 2018-08-03 ENCOUNTER — Other Ambulatory Visit (HOSPITAL_COMMUNITY): Payer: Self-pay | Admitting: Internal Medicine

## 2018-08-04 DIAGNOSIS — Z23 Encounter for immunization: Secondary | ICD-10-CM | POA: Diagnosis not present

## 2018-08-21 DIAGNOSIS — H524 Presbyopia: Secondary | ICD-10-CM | POA: Diagnosis not present

## 2018-08-21 DIAGNOSIS — H2513 Age-related nuclear cataract, bilateral: Secondary | ICD-10-CM | POA: Diagnosis not present

## 2018-09-01 ENCOUNTER — Other Ambulatory Visit (HOSPITAL_COMMUNITY): Payer: Self-pay | Admitting: Internal Medicine

## 2018-09-22 ENCOUNTER — Other Ambulatory Visit: Payer: Self-pay | Admitting: Cardiology

## 2018-10-08 ENCOUNTER — Other Ambulatory Visit (HOSPITAL_COMMUNITY): Payer: Self-pay | Admitting: Internal Medicine

## 2018-11-09 ENCOUNTER — Other Ambulatory Visit (HOSPITAL_COMMUNITY): Payer: Self-pay | Admitting: Internal Medicine

## 2018-11-15 DIAGNOSIS — Z0100 Encounter for examination of eyes and vision without abnormal findings: Secondary | ICD-10-CM | POA: Diagnosis not present

## 2018-12-12 ENCOUNTER — Other Ambulatory Visit (HOSPITAL_COMMUNITY): Payer: Self-pay | Admitting: Internal Medicine

## 2018-12-30 ENCOUNTER — Other Ambulatory Visit (HOSPITAL_COMMUNITY): Payer: Self-pay | Admitting: Internal Medicine

## 2019-02-18 ENCOUNTER — Other Ambulatory Visit (HOSPITAL_COMMUNITY): Payer: Self-pay | Admitting: Internal Medicine

## 2019-04-02 ENCOUNTER — Other Ambulatory Visit (HOSPITAL_COMMUNITY): Payer: Self-pay | Admitting: Internal Medicine

## 2019-04-06 ENCOUNTER — Other Ambulatory Visit (HOSPITAL_COMMUNITY): Payer: Self-pay | Admitting: Internal Medicine

## 2019-04-19 ENCOUNTER — Other Ambulatory Visit: Payer: Self-pay | Admitting: Internal Medicine

## 2019-04-27 ENCOUNTER — Other Ambulatory Visit (HOSPITAL_COMMUNITY): Payer: Self-pay | Admitting: Internal Medicine

## 2019-04-30 ENCOUNTER — Other Ambulatory Visit (HOSPITAL_COMMUNITY): Payer: Self-pay | Admitting: Internal Medicine

## 2019-05-03 ENCOUNTER — Other Ambulatory Visit (HOSPITAL_COMMUNITY)
Admission: RE | Admit: 2019-05-03 | Discharge: 2019-05-03 | Disposition: A | Discharge status: Home / Self Care | Payer: Medicare HMO | Source: Ambulatory Visit | Attending: Internal Medicine | Admitting: Internal Medicine

## 2019-05-03 ENCOUNTER — Other Ambulatory Visit: Payer: Self-pay

## 2019-05-03 ENCOUNTER — Other Ambulatory Visit (HOSPITAL_COMMUNITY): Payer: Self-pay | Admitting: *Deleted

## 2019-05-03 ENCOUNTER — Ambulatory Visit (HOSPITAL_COMMUNITY)
Admission: RE | Admit: 2019-05-03 | Discharge: 2019-05-03 | Disposition: A | Discharge status: Home / Self Care | Payer: Medicare HMO | Source: Ambulatory Visit | Attending: Internal Medicine | Admitting: Internal Medicine

## 2019-05-03 ENCOUNTER — Telehealth (HOSPITAL_COMMUNITY): Payer: Self-pay | Admitting: *Deleted

## 2019-05-03 DIAGNOSIS — I2510 Atherosclerotic heart disease of native coronary artery without angina pectoris: Secondary | ICD-10-CM | POA: Diagnosis not present

## 2019-05-03 DIAGNOSIS — R079 Chest pain, unspecified: Secondary | ICD-10-CM

## 2019-05-03 DIAGNOSIS — Z01812 Encounter for preprocedural laboratory examination: Secondary | ICD-10-CM | POA: Insufficient documentation

## 2019-05-03 DIAGNOSIS — Z20828 Contact with and (suspected) exposure to other viral communicable diseases: Secondary | ICD-10-CM | POA: Insufficient documentation

## 2019-05-03 DIAGNOSIS — I200 Unstable angina: Secondary | ICD-10-CM

## 2019-05-03 LAB — SARS CORONAVIRUS 2 (TAT 6-24 HRS): SARS Coronavirus 2: NEGATIVE

## 2019-05-03 MED ORDER — SODIUM CHLORIDE 0.9% FLUSH: 3.0000 mL | Freq: Two times a day (BID) | INTRAVENOUS | Status: DC

## 2019-05-03 NOTE — Telephone Encounter (Signed)
Pt called to report he has been having CP.  He states it occurred Sunday while cutting the grass and then resolved on it's own.  Since then he has had the CP again on Tue night that woke him up from sleep and then CP again Wed night, he states all times it has resolved on its own.  He reports the pain is like a heaviness/pressure in his chest, does not radiate, denies SOB w/it.  Discussed all above w/Dr Bensimhon, he recommends pt get a LHC tomorrow due to his h/o cad and cabg.  He will do a virtual visit w/pt this afternoon as well.  Spoke w/pt and wife, they are aware and agreeable w/plan.  Pt will go this afternoon for his COVID test and report to hospital tomorrow at 10:30 for LHC at 12:30, all instructions reviewed w/him and his wife.  He does voice concern about coming to hospital due to Covid, reassured pt we are taking appropriate precautions to ensure pt safety, he is agreeable.

## 2019-05-03 NOTE — Progress Notes (Signed)
Heart Failure TeleHealth Note  Due to national recommendations of social distancing due to [place] 19, Audio/video telehealth visit is felt to be most appropriate for this patient at this time.  See MyChart message from today for patient consent regarding telehealth for [facility].  Date:  05/03/2019   ID:  [name], DOB 30-Aug-1941, MRN [id number]  Location: Home  Provider location: [facility] Type of Visit: Established patient  PCP:  Tisovec, Fransico Him, MD  Cardiologist:  No primary care provider on file. Primary HF: Shaine Mount  Chief Complaint: Heart Failure follow-up   History of Present Illness:  [name] is a 78 y.o. male with history of CAD, multiple angioplasties to the RCA dating back to 1983 followed by CABG in 2012, HTN, and hyperlipidemia.  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  He was hospitalized in 11/11 because of recurrent chest pain. He underwent drug-eluting stent to the proximal RCA July 24, 2010. Ejection fraction was 65%. He also had 99% stenosis in the distal PDA with left to right collaterals and nonobstructive disease in the left coronary tree.  Admitted in October 2012 with [place]. Found to have progressive CAD. Underwent Coronary artery bypass grafting x4 (left internal mammary artery to LAD, saphenous vein graft to circumflex marginal, saphenous vein graft to posterior descending branch of the distal right coronary, saphenous vein graft to the posterolateral branch of the distal right coronary artery).   Previously suggested to increased Atorvastatin to 80 mg daily but previously had elevated LFTs on 80mg  daily. Has tried Crestor in past and felt it was not effective (numbers went up). Was seen in [facility] and niaspan added to help bring numbers down.  Stopped Niaspan after AIM-HIGH trial. No longer followed in Lipid Clinic. Tolerating atorva 80 well since 2014    He presents via audio/video conferencing for a telehealth visit today due to CP. Starting on Sunday developed exertional CP while pushing lawnmower. Pain went away after stopping. Able to finish cutting the grass. Past two nights has had CP at night. Took NTG with relief.      Carotid u/s 2/12: 40-59% bilat (stable) Cartoid u/s 2/11: LICA 94-17%, RICA 40-81%  Carotid u/s 9/13 40-59% bilaterally  Carotid u/s 1/16 40-59% bilaterally  Carotid u/s 4/48 RICA 18-56%, LICA 31-49% - [facility] with [facility] in 5/18: 5/18 RICA 70-26% and LICA 3-78%  01/19/84 ABI This led to a noninvasive lower extremity arterial study. This demonstrated noncompressible tibial arteries. His ABIs were 1.1 on the right and 1.0 on the left. His toe brachial indices were 0.77 on the right and 0.63 on the left. Waveforms were triphasic. Seen by Dr. Burt Knack and felt he was OK.   [name] denies symptoms worrisome for COVID 19.   Past Medical History:  Diagnosis Date  . CAD (coronary artery disease)    s/p multiple PCIs to RCA dating back to 78. Last stent to proximal RCA in November of 2011. Also had 99% stenosis in the distal PDA with left to right collaterals and nonobstructive disease in the left coronary tree. ;  ISR of RCA 10/12 - CABG 10/9 with Dr. Lucianne Lei Trigt: L-LAD, S-OM, S-PDA  . 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 (stable)  . HTN (hypertension)   . Hyperlipidemia    Past Surgical History:  Procedure Laterality Date  . Coronary artery bypass  grafting x4 (left internal mammary artery  06/23/2011  . CORONARY STENT PLACEMENT  Nov 2011   prox RCA. Has had prior multiple procedures.  Marland Kitchen. left total hip replacement  2008  . TOOTH EXTRACTION  06/2013     Current Outpatient Medications  Medication Sig Dispense Refill  . acetaminophen (TYLENOL) 325 MG tablet Take 650 mg by mouth every 6 (six) hours as needed for mild pain or headache.    Marland Kitchen. amLODipine (NORVASC) 10 MG tablet TAKE 1  TABLET ONCE DAILY. 90 tablet 0  . amoxicillin (AMOXIL) 250 MG capsule Take 250 mg by mouth as directed. 4 tablets 1 hour prior to having dental procedures    . aspirin 81 MG tablet Take 81 mg by mouth daily.    Marland Kitchen. atorvastatin (LIPITOR) 80 MG tablet TAKE 1 TABLET ONCE DAILY. 90 tablet 2  . ezetimibe (ZETIA) 10 MG tablet TAKE 1 TABLET BY MOUTH DAILY. 90 tablet 0  . losartan (COZAAR) 100 MG tablet TAKE ONE TABLET BY MOUTH DAILY 90 tablet 3  . NITROSTAT 0.4 MG SL tablet 1 TAB UNDER TONGUE AS NEEDED FOR CHEST PAIN. MAY REPEAT EVERY 5 MIN FOR A TOTAL OF 3 DOSES. 25 tablet 0  . Omega-3 Fatty Acids (FISH OIL DOUBLE STRENGTH) 1200 MG CAPS Take 2 capsules by mouth daily.      Marland Kitchen. spironolactone (ALDACTONE) 25 MG tablet TAKE (1/2) TABLET DAILY. 45 tablet 0  . Tamsulosin HCl (FLOMAX) 0.4 MG CAPS Take 0.8 mg by mouth daily.      No current facility-administered medications for this encounter.    Facility-Administered Medications Ordered in Other Encounters  Medication Dose Route Frequency Provider Last Rate Last Dose  . sodium chloride flush (NS) 0.9 % injection 3 mL  3 mL Intravenous Q12H Danner Paulding, Bevelyn Bucklesaniel R, MD        Allergies:   Morphine   Social History:  The patient  reports that he has never smoked. He has never used smokeless tobacco. He reports current alcohol use of about 6.0 standard drinks of alcohol per week. He reports that he does not use drugs.   Family History:  The patient's family history includes Heart disease in his father.   ROS:  Please see the history of present illness.   All other systems are personally reviewed and negative.   Exam:  (Video/Tele Health Call; Exam is subjective and or/visual.) General:  Speaks in full sentences. No resp difficulty. Lungs: Normal respiratory effort with conversation.  Abdomen: Non-distended per patient report Extremities: Pt denies edema. Neuro: Alert & oriented x 3.   Recent Labs: No results found for requested labs within last 8760  hours.  Personally reviewed   Wt Readings from Last 3 Encounters:  07/03/18 86.9 kg (191 lb 9.6 oz)  01/30/18 87.8 kg (193 lb 9.6 oz)  04/18/17 91.2 kg (201 lb)      ASSESSMENT AND PLAN:  1. Recurrent CP with h/o CAD  S/p CABG 2012 - CP very concerning for progressive angina and I worry about possible impending graft failure - Currently no CP for last 12 hours.  - Will plan cath tomorrow. If any recurrence of CP today call 911 - Continue statin and ASA. Unable to toelrate b-blocker due to fatigue and bradycardia 2. HTN - Continue losartan 100 mg daily and amlodipine 10 mg daily.  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 CCA with >50% hemodynamically significant plaque.  - Remains asymptomatic. Follows with  Dr. Myra GianottiBrabham 4. HL - Continue atorva 80   COVID screen The patient does not have any symptoms that suggest any further testing/ screening at this time.  Social distancing reinforced today.  Recommended follow-up:  As above  Relevant cardiac medications were reviewed at length with the patient today.   The patient does not have concerns regarding their medications at this time.   The following changes were made today:  As above  Today, I have spent 16 minutes with the patient with telehealth technology discussing the above issues .    Signed, Arvilla Meresaniel Laasia Arcos, MD  05/03/2019 12:44 PM  [facility] [address] [facility] [place] [address] [phone] (office) [phone] (fax)

## 2019-05-03 NOTE — H&P (View-Only) (Signed)
Heart Failure TeleHealth Note  Due to national recommendations of social distancing due to [place] 19, Audio/video telehealth visit is felt to be most appropriate for this patient at this time.  See MyChart message from today for patient consent regarding telehealth for [facility].  Date:  05/03/2019   ID:  [name], DOB 30-Aug-1941, MRN [id number]  Location: Home  Provider location: [facility] Type of Visit: Established patient  PCP:  Tisovec, Fransico Him, MD  Cardiologist:  No primary care provider on file. Primary HF: Anistyn Graddy  Chief Complaint: Heart Failure follow-up   History of Present Illness:  [name] is a 78 y.o. male with history of CAD, multiple angioplasties to the RCA dating back to 1983 followed by CABG in 2012, HTN, and hyperlipidemia.  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  He was hospitalized in 11/11 because of recurrent chest pain. He underwent drug-eluting stent to the proximal RCA July 24, 2010. Ejection fraction was 65%. He also had 99% stenosis in the distal PDA with left to right collaterals and nonobstructive disease in the left coronary tree.  Admitted in October 2012 with [place]. Found to have progressive CAD. Underwent Coronary artery bypass grafting x4 (left internal mammary artery to LAD, saphenous vein graft to circumflex marginal, saphenous vein graft to posterior descending branch of the distal right coronary, saphenous vein graft to the posterolateral branch of the distal right coronary artery).   Previously suggested to increased Atorvastatin to 80 mg daily but previously had elevated LFTs on 80mg  daily. Has tried Crestor in past and felt it was not effective (numbers went up). Was seen in [facility] and niaspan added to help bring numbers down.  Stopped Niaspan after AIM-HIGH trial. No longer followed in Lipid Clinic. Tolerating atorva 80 well since 2014    He presents via audio/video conferencing for a telehealth visit today due to CP. Starting on Sunday developed exertional CP while pushing lawnmower. Pain went away after stopping. Able to finish cutting the grass. Past two nights has had CP at night. Took NTG with relief.      Carotid u/s 2/12: 40-59% bilat (stable) Cartoid u/s 2/11: LICA 94-17%, RICA 40-81%  Carotid u/s 9/13 40-59% bilaterally  Carotid u/s 1/16 40-59% bilaterally  Carotid u/s 4/48 RICA 18-56%, LICA 31-49% - [facility] with [facility] in 5/18: 5/18 RICA 70-26% and LICA 3-78%  01/19/84 ABI This led to a noninvasive lower extremity arterial study. This demonstrated noncompressible tibial arteries. His ABIs were 1.1 on the right and 1.0 on the left. His toe brachial indices were 0.77 on the right and 0.63 on the left. Waveforms were triphasic. Seen by Dr. Burt Knack and felt he was OK.   [name] denies symptoms worrisome for COVID 19.   Past Medical History:  Diagnosis Date  . CAD (coronary artery disease)    s/p multiple PCIs to RCA dating back to 78. Last stent to proximal RCA in November of 2011. Also had 99% stenosis in the distal PDA with left to right collaterals and nonobstructive disease in the left coronary tree. ;  ISR of RCA 10/12 - CABG 10/9 with Dr. Lucianne Lei Trigt: L-LAD, S-OM, S-PDA  . 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 (stable)  . HTN (hypertension)   . Hyperlipidemia    Past Surgical History:  Procedure Laterality Date  . Coronary artery bypass  grafting x4 (left internal mammary artery  06/23/2011  . CORONARY STENT PLACEMENT  Nov 2011   prox RCA. Has had prior multiple procedures.  . left total hip replacement  2008  . TOOTH EXTRACTION  06/2013     Current Outpatient Medications  Medication Sig Dispense Refill  . acetaminophen (TYLENOL) 325 MG tablet Take 650 mg by mouth every 6 (six) hours as needed for mild pain or headache.    . amLODipine (NORVASC) 10 MG tablet TAKE 1  TABLET ONCE DAILY. 90 tablet 0  . amoxicillin (AMOXIL) 250 MG capsule Take 250 mg by mouth as directed. 4 tablets 1 hour prior to having dental procedures    . aspirin 81 MG tablet Take 81 mg by mouth daily.    . atorvastatin (LIPITOR) 80 MG tablet TAKE 1 TABLET ONCE DAILY. 90 tablet 2  . ezetimibe (ZETIA) 10 MG tablet TAKE 1 TABLET BY MOUTH DAILY. 90 tablet 0  . losartan (COZAAR) 100 MG tablet TAKE ONE TABLET BY MOUTH DAILY 90 tablet 3  . NITROSTAT 0.4 MG SL tablet 1 TAB UNDER TONGUE AS NEEDED FOR CHEST PAIN. MAY REPEAT EVERY 5 MIN FOR A TOTAL OF 3 DOSES. 25 tablet 0  . Omega-3 Fatty Acids (FISH OIL DOUBLE STRENGTH) 1200 MG CAPS Take 2 capsules by mouth daily.      . spironolactone (ALDACTONE) 25 MG tablet TAKE (1/2) TABLET DAILY. 45 tablet 0  . Tamsulosin HCl (FLOMAX) 0.4 MG CAPS Take 0.8 mg by mouth daily.      No current facility-administered medications for this encounter.    Facility-Administered Medications Ordered in Other Encounters  Medication Dose Route Frequency Provider Last Rate Last Dose  . sodium chloride flush (NS) 0.9 % injection 3 mL  3 mL Intravenous Q12H Sumiya Mamaril R, MD        Allergies:   Morphine   Social History:  The patient  reports that he has never smoked. He has never used smokeless tobacco. He reports current alcohol use of about 6.0 standard drinks of alcohol per week. He reports that he does not use drugs.   Family History:  The patient's family history includes Heart disease in his father.   ROS:  Please see the history of present illness.   All other systems are personally reviewed and negative.   Exam:  (Video/Tele Health Call; Exam is subjective and or/visual.) General:  Speaks in full sentences. No resp difficulty. Lungs: Normal respiratory effort with conversation.  Abdomen: Non-distended per patient report Extremities: Pt denies edema. Neuro: Alert & oriented x 3.   Recent Labs: No results found for requested labs within last 8760  hours.  Personally reviewed   Wt Readings from Last 3 Encounters:  07/03/18 86.9 kg (191 lb 9.6 oz)  01/30/18 87.8 kg (193 lb 9.6 oz)  04/18/17 91.2 kg (201 lb)      ASSESSMENT AND PLAN:  1. Recurrent CP with h/o CAD  S/p CABG 2012 - CP very concerning for progressive angina and I worry about possible impending graft failure - Currently no CP for last 12 hours.  - Will plan cath tomorrow. If any recurrence of CP today call 911 - Continue statin and ASA. Unable to toelrate b-blocker due to fatigue and bradycardia 2. HTN - Continue losartan 100 mg daily and amlodipine 10 mg daily.  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 CCA with >50% hemodynamically significant plaque.  - Remains asymptomatic. Follows with   Dr. Myra GianottiBrabham 4. HL - Continue atorva 80   COVID screen The patient does not have any symptoms that suggest any further testing/ screening at this time.  Social distancing reinforced today.  Recommended follow-up:  As above  Relevant cardiac medications were reviewed at length with the patient today.   The patient does not have concerns regarding their medications at this time.   The following changes were made today:  As above  Today, I have spent 16 minutes with the patient with telehealth technology discussing the above issues .    Signed, Arvilla Meresaniel Rahima Fleishman, MD  05/03/2019 12:44 PM  [facility] [address] [facility] [place] [address] [phone] (office) [phone] (fax)

## 2019-05-03 NOTE — Telephone Encounter (Signed)
Pts wife called requesting an appt with Dr.Bensimhon today. I told her he did not have any openings but I could addhim to the app schedule tomorrow to see our nurse practitioner. pts wife only wants patient seen by Dr.Bensimhon states he has been having bed chest pain at night. She requests I speak to Dr.Bensimhon directly and see if he can add patient on. Call routed to [place].

## 2019-05-04 ENCOUNTER — Other Ambulatory Visit: Payer: Self-pay

## 2019-05-04 ENCOUNTER — Ambulatory Visit (HOSPITAL_COMMUNITY)
Admission: RE | Admit: 2019-05-04 | Discharge: 2019-05-04 | Disposition: A | Discharge status: Home / Self Care | Payer: Medicare HMO | Source: Ambulatory Visit | Attending: Internal Medicine | Admitting: Internal Medicine

## 2019-05-04 ENCOUNTER — Encounter (HOSPITAL_COMMUNITY)
Admission: RE | Disposition: A | Discharge status: Home / Self Care | Payer: Self-pay | Source: Ambulatory Visit | Attending: Internal Medicine

## 2019-05-04 ENCOUNTER — Encounter (HOSPITAL_COMMUNITY): Payer: Self-pay | Admitting: *Deleted

## 2019-05-04 DIAGNOSIS — Z951 Presence of aortocoronary bypass graft: Secondary | ICD-10-CM | POA: Diagnosis not present

## 2019-05-04 DIAGNOSIS — R079 Chest pain, unspecified: Secondary | ICD-10-CM

## 2019-05-04 DIAGNOSIS — Z7982 Long term (current) use of aspirin: Secondary | ICD-10-CM | POA: Insufficient documentation

## 2019-05-04 DIAGNOSIS — I25110 Atherosclerotic heart disease of native coronary artery with unstable angina pectoris: Secondary | ICD-10-CM

## 2019-05-04 DIAGNOSIS — Z885 Allergy status to narcotic agent status: Secondary | ICD-10-CM | POA: Diagnosis not present

## 2019-05-04 DIAGNOSIS — I2510 Atherosclerotic heart disease of native coronary artery without angina pectoris: Secondary | ICD-10-CM

## 2019-05-04 DIAGNOSIS — Z79899 Other long term (current) drug therapy: Secondary | ICD-10-CM | POA: Diagnosis not present

## 2019-05-04 DIAGNOSIS — I10 Essential (primary) hypertension: Secondary | ICD-10-CM | POA: Insufficient documentation

## 2019-05-04 DIAGNOSIS — E785 Hyperlipidemia, unspecified: Secondary | ICD-10-CM | POA: Insufficient documentation

## 2019-05-04 DIAGNOSIS — I6523 Occlusion and stenosis of bilateral carotid arteries: Secondary | ICD-10-CM | POA: Insufficient documentation

## 2019-05-04 HISTORY — PX: CORONARY/GRAFT ANGIOGRAPHY: CATH118237

## 2019-05-04 LAB — CBC
HCT: 41.1 % (ref 39.0–52.0)
Hemoglobin: 14 g/dL (ref 13.0–17.0)
MCH: 32.9 pg (ref 26.0–34.0)
MCHC: 34.1 g/dL (ref 30.0–36.0)
MCV: 96.5 fL (ref 80.0–100.0)
Platelets: 228 10*3/uL (ref 150–400)
RBC: 4.26 MIL/uL (ref 4.22–5.81)
RDW: 12.1 % (ref 11.5–15.5)
WBC: 8.9 10*3/uL (ref 4.0–10.5)
nRBC: 0 % (ref 0.0–0.2)

## 2019-05-04 LAB — BASIC METABOLIC PANEL
Anion gap: 10 (ref 5–15)
BUN: 11 mg/dL (ref 8–23)
CO2: 23 mmol/L (ref 22–32)
Calcium: 9.4 mg/dL (ref 8.9–10.3)
Chloride: 106 mmol/L (ref 98–111)
Creatinine, Ser: 0.8 mg/dL (ref 0.61–1.24)
GFR calc Af Amer: >60 mL/min (ref >60)
GFR calc non Af Amer: >60 mL/min (ref >60)
Glucose, Bld: 107 mg/dL — ABNORMAL HIGH (ref 70–99)
Potassium: 3.8 mmol/L (ref 3.5–5.1)
Sodium: 139 mmol/L (ref 135–145)

## 2019-05-04 SURGERY — CORONARY/GRAFT ANGIOGRAPHY
Anesthesia: LOCAL

## 2019-05-04 MED ORDER — FENTANYL CITRATE (PF) 100 MCG/2ML IJ SOLN
INTRAMUSCULAR | Status: AC
  Filled 2019-05-04: qty 2

## 2019-05-04 MED ORDER — LIDOCAINE HCL (PF) 1 % IJ SOLN
INTRAMUSCULAR | Status: DC | PRN
  Administered 2019-05-04: 15 mL

## 2019-05-04 MED ORDER — FENTANYL CITRATE (PF) 100 MCG/2ML IJ SOLN
INTRAMUSCULAR | Status: DC | PRN
  Administered 2019-05-04 (×2): 25 ug via INTRAVENOUS
  Administered 2019-05-04: 50 ug via INTRAVENOUS

## 2019-05-04 MED ORDER — MIDAZOLAM HCL 2 MG/2ML IJ SOLN
INTRAMUSCULAR | Status: AC
  Filled 2019-05-04: qty 2

## 2019-05-04 MED ORDER — SODIUM CHLORIDE 0.9% FLUSH: 3.0000 mL | Freq: Two times a day (BID) | INTRAVENOUS | Status: DC

## 2019-05-04 MED ORDER — HEPARIN (PORCINE) IN NACL 1000-0.9 UT/500ML-% IV SOLN
INTRAVENOUS | Status: DC | PRN
  Administered 2019-05-04: 500 mL

## 2019-05-04 MED ORDER — MIDAZOLAM HCL 2 MG/2ML IJ SOLN
INTRAMUSCULAR | Status: DC | PRN
  Administered 2019-05-04: 1 mg via INTRAVENOUS
  Administered 2019-05-04: 2 mg via INTRAVENOUS

## 2019-05-04 MED ORDER — ONDANSETRON HCL 4 MG/2ML IJ SOLN: 4.0000 mg | Freq: Four times a day (QID) | INTRAMUSCULAR | Status: DC | PRN

## 2019-05-04 MED ORDER — SODIUM CHLORIDE 0.9 % IV SOLN: 250.0000 mL | INTRAVENOUS | Status: DC | PRN

## 2019-05-04 MED ORDER — LABETALOL HCL 5 MG/ML IV SOLN: 10.0000 mg | INTRAVENOUS | Status: DC | PRN

## 2019-05-04 MED ORDER — SODIUM CHLORIDE 0.9 % IV SOLN
INTRAVENOUS | Status: DC
  Administered 2019-05-04: 13:00:00 via INTRAVENOUS

## 2019-05-04 MED ORDER — ACETAMINOPHEN 325 MG PO TABS: 650.0000 mg | ORAL_TABLET | ORAL | Status: DC | PRN

## 2019-05-04 MED ORDER — IOHEXOL 350 MG/ML SOLN
INTRAVENOUS | Status: DC | PRN
  Administered 2019-05-04: 15:00:00 175 mL via INTRA_ARTERIAL

## 2019-05-04 MED ORDER — HEPARIN (PORCINE) IN NACL 1000-0.9 UT/500ML-% IV SOLN
INTRAVENOUS | Status: AC
  Filled 2019-05-04: qty 1500

## 2019-05-04 MED ORDER — SODIUM CHLORIDE 0.9% FLUSH: 3.0000 mL | INTRAVENOUS | Status: DC | PRN

## 2019-05-04 MED ORDER — HYDRALAZINE HCL 20 MG/ML IJ SOLN: 10.0000 mg | INTRAMUSCULAR | Status: DC | PRN

## 2019-05-04 MED ORDER — SODIUM CHLORIDE 0.9 % IV SOLN: INTRAVENOUS | Status: AC

## 2019-05-04 MED ORDER — LIDOCAINE HCL (PF) 1 % IJ SOLN
INTRAMUSCULAR | Status: AC
  Filled 2019-05-04: qty 30

## 2019-05-04 SURGICAL SUPPLY — 19 items
CATH EXPO 5F MPA-1 (CATHETERS) ×1 IMPLANT
CATH INFINITI 5 FR 3DRC (CATHETERS) ×1 IMPLANT
CATH INFINITI 5 FR AL2 (CATHETERS) ×1 IMPLANT
CATH INFINITI 5 FR IM (CATHETERS) ×1 IMPLANT
CATH INFINITI 5 FR LCB (CATHETERS) ×1 IMPLANT
CATH INFINITI 5 FR RCB (CATHETERS) ×1 IMPLANT
CATH INFINITI 5FR AL1 (CATHETERS) ×1 IMPLANT
CATH INFINITI 5FR AL3 (CATHETERS) ×1 IMPLANT
CATH INFINITI 5FR MULTPACK ANG (CATHETERS) ×1 IMPLANT
CATH INFINITI JR4 5F (CATHETERS) ×2 IMPLANT
CATH LAUNCHER 5F 3DRIGHT (CATHETERS) IMPLANT
CATH LAUNCHER 6FR AL3 (CATHETERS) ×1 IMPLANT
CATHETER LAUNCHER 5F 3DRIGHT (CATHETERS) ×2
CLOSURE MYNX CONTROL 5F (Vascular Products) ×1 IMPLANT
KIT HEART LEFT (KITS) ×2 IMPLANT
PACK CARDIAC CATHETERIZATION (CUSTOM PROCEDURE TRAY) ×2 IMPLANT
SHEATH PINNACLE 5F 10CM (SHEATH) ×1 IMPLANT
TRANSDUCER W/STOPCOCK (MISCELLANEOUS) ×2 IMPLANT
WIRE EMERALD 3MM-J .035X150CM (WIRE) ×1 IMPLANT

## 2019-05-04 NOTE — Discharge Instructions (Signed)
Femoral Site Care °This sheet gives you information about how to care for yourself after your procedure. Your health care provider may also give you more specific instructions. If you have problems or questions, contact your health care provider. °What can I expect after the procedure? °After the procedure, it is common to have: °· Bruising that usually fades within 1-2 weeks. °· Tenderness at the site. °Follow these instructions at home: °Wound care °· Follow instructions from your health care provider about how to take care of your insertion site. Make sure you: °? Wash your hands with soap and water before you change your bandage (dressing). If soap and water are not available, use hand sanitizer. °? Change your dressing as told by your health care provider. °? Leave stitches (sutures), skin glue, or adhesive strips in place. These skin closures may need to stay in place for 2 weeks or longer. If adhesive strip edges start to loosen and curl up, you may trim the loose edges. Do not remove adhesive strips completely unless your health care provider tells you to do that. °· Do not take baths, swim, or use a hot tub until your health care provider approves. °· You may shower 24-48 hours after the procedure or as told by your health care provider. °? Gently wash the site with plain soap and water. °? Pat the area dry with a clean towel. °? Do not rub the site. This may cause bleeding. °· Do not apply powder or lotion to the site. Keep the site clean and dry. °· Check your femoral site every day for signs of infection. Check for: °? Redness, swelling, or pain. °? Fluid or blood. °? Warmth. °? Pus or a bad smell. °Activity °· For the first 2-3 days after your procedure, or as long as directed: °? Avoid climbing stairs as much as possible. °? Do not squat. °· Do not lift anything that is heavier than 10 lb (4.5 kg), or the limit that you are told, until your health care provider says that it is safe. °· Rest as  directed. °? Avoid sitting for a long time without moving. Get up to take short walks every 1-2 hours. °· Do not drive for 24 hours if you were given a medicine to help you relax (sedative). °General instructions °· Take over-the-counter and prescription medicines only as told by your health care provider. °· Keep all follow-up visits as told by your health care provider. This is important. °Contact a health care provider if you have: °· A fever or chills. °· You have redness, swelling, or pain around your insertion site. °Get help right away if: °· The catheter insertion area swells very fast. °· You pass out. °· You suddenly start to sweat or your skin gets clammy. °· The catheter insertion area is bleeding, and the bleeding does not stop when you hold steady pressure on the area. °· The area near or just beyond the catheter insertion site becomes pale, cool, tingly, or numb. °These symptoms may represent a serious problem that is an emergency. Do not wait to see if the symptoms will go away. Get medical help right away. Call your local emergency services (911 in the [place] Do not drive yourself to the hospital. °Summary °· After the procedure, it is common to have bruising that usually fades within 1-2 weeks. °· Check your femoral site every day for signs of infection. °· Do not lift anything that is heavier than 10 lb (4.5 kg), or the   limit that you are told, until your health care provider says that it is safe. °This information is not intended to replace advice given to you by your health care provider. Make sure you discuss any questions you have with your health care provider. °Document Released: 05/03/2014 Document Revised: 09/12/2017 Document Reviewed: 09/12/2017 °Elsevier Patient Education © 2020 Elsevier Inc. ° °

## 2019-05-04 NOTE — Interval H&P Note (Signed)
History and Physical Interval Note:  05/04/2019 12:06 PM  [name]  has presented today for surgery, with the diagnosis of chest pain.  The various methods of treatment have been discussed with the patient and family. After consideration of risks, benefits and other options for treatment, the patient has consented to  Procedure(s): LEFT HEART CATH AND CORS/GRAFTS ANGIOGRAPHY (N/A) and possible percutaneous coronary angioplasty  as a surgical intervention.  The patient's history has been reviewed, patient examined, no change in status, stable for surgery.  I have reviewed the patient's chart and labs.  Questions were answered to the patient's satisfaction.     [name]

## 2019-05-07 ENCOUNTER — Encounter (HOSPITAL_COMMUNITY): Payer: Self-pay | Admitting: Internal Medicine

## 2019-05-09 ENCOUNTER — Telehealth (HOSPITAL_COMMUNITY): Payer: Self-pay | Admitting: *Deleted

## 2019-05-09 NOTE — Telephone Encounter (Signed)
Pt left VM stating he called our office yesterday and left msg requested a return call. Called pt back no answer/no vm set up.

## 2019-05-16 ENCOUNTER — Encounter (HOSPITAL_COMMUNITY): Payer: Self-pay | Admitting: Internal Medicine

## 2019-06-13 ENCOUNTER — Encounter (HOSPITAL_COMMUNITY): Payer: Self-pay | Admitting: Internal Medicine

## 2019-06-13 ENCOUNTER — Other Ambulatory Visit: Payer: Self-pay

## 2019-06-13 ENCOUNTER — Ambulatory Visit (HOSPITAL_COMMUNITY)
Admission: RE | Admit: 2019-06-13 | Discharge: 2019-06-13 | Disposition: A | Discharge status: Home / Self Care | Payer: Medicare HMO | Source: Ambulatory Visit | Attending: Internal Medicine | Admitting: Internal Medicine

## 2019-06-13 ENCOUNTER — Encounter (HOSPITAL_COMMUNITY): Payer: Self-pay | Admitting: *Deleted

## 2019-06-13 VITALS — BP 151/51 | HR 71 | Wt 195.8 lb

## 2019-06-13 DIAGNOSIS — Z885 Allergy status to narcotic agent status: Secondary | ICD-10-CM | POA: Insufficient documentation

## 2019-06-13 DIAGNOSIS — I6523 Occlusion and stenosis of bilateral carotid arteries: Secondary | ICD-10-CM | POA: Diagnosis not present

## 2019-06-13 DIAGNOSIS — I2510 Atherosclerotic heart disease of native coronary artery without angina pectoris: Secondary | ICD-10-CM

## 2019-06-13 DIAGNOSIS — Z7982 Long term (current) use of aspirin: Secondary | ICD-10-CM | POA: Insufficient documentation

## 2019-06-13 DIAGNOSIS — E785 Hyperlipidemia, unspecified: Secondary | ICD-10-CM | POA: Insufficient documentation

## 2019-06-13 DIAGNOSIS — Z951 Presence of aortocoronary bypass graft: Secondary | ICD-10-CM | POA: Diagnosis not present

## 2019-06-13 DIAGNOSIS — I110 Hypertensive heart disease with heart failure: Secondary | ICD-10-CM | POA: Insufficient documentation

## 2019-06-13 DIAGNOSIS — Z8249 Family history of ischemic heart disease and other diseases of the circulatory system: Secondary | ICD-10-CM | POA: Insufficient documentation

## 2019-06-13 DIAGNOSIS — Z955 Presence of coronary angioplasty implant and graft: Secondary | ICD-10-CM | POA: Insufficient documentation

## 2019-06-13 DIAGNOSIS — I10 Essential (primary) hypertension: Secondary | ICD-10-CM

## 2019-06-13 DIAGNOSIS — Z79899 Other long term (current) drug therapy: Secondary | ICD-10-CM | POA: Diagnosis not present

## 2019-06-13 DIAGNOSIS — E7849 Other hyperlipidemia: Secondary | ICD-10-CM

## 2019-06-13 MED ORDER — PANTOPRAZOLE SODIUM 40 MG PO TBEC: 40.0000 mg | DELAYED_RELEASE_TABLET | Freq: Every day | ORAL | 1 refills | Status: DC

## 2019-06-13 MED ORDER — AMLODIPINE BESYLATE 10 MG PO TABS: 10.0000 mg | ORAL_TABLET | Freq: Every day | ORAL | 6 refills | Status: DC

## 2019-06-13 NOTE — Progress Notes (Signed)
Advanced Heart Failure Clinic Note   Date:  06/13/2019   ID:  [name], DOB 12/25/1940, MRN [id number]  Location: Home  Provider location: [facility] Type of Visit: Established patient  PCP:  Tisovec, Adelfa Kohichard W, MD  Cardiologist:  No primary care provider on file. Primary HF: Giancarlos Berendt  Chief Complaint: Heart Failure follow-up   History of Present Illness:  [name] is a 78 y.o. male with history of CAD, multiple angioplasties to the RCA dating back to 1983 followed by CABG in 2012, HTN, and hyperlipidemia.  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  He was hospitalized in 11/11 because of recurrent chest pain. He underwent drug-eluting stent to the proximal RCA July 24, 2010. Ejection fraction was 65%. He also had 99% stenosis in the distal PDA with left to right collaterals and nonobstructive disease in the left coronary tree.  [place] in [place] with [facility]. Found to have progressive CAD. Underwent Coronary artery bypass grafting x4 (left internal mammary artery to LAD, saphenous vein graft to circumflex marginal, saphenous vein graft to posterior descending branch of the distal right coronary, saphenous vein graft to the posterolateral branch of the distal right coronary artery).   Had recurrent CP in 8/20 underwent cath which showed stable anatomy no targets for PCI  1. 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 LAD 100% 3. RCA 100% 4. LCX 50% in mid LCX 5. LIMA to LAD widely patent 6. SVG to R PDA widely patent 7. SVG to OM occluded ostially 8. SVG to PL occluded ostially  Has moderate disease in anomalous LCX but not crittcal and anatomy precludes PCI. Chronic occlusion of SVG x 2   He presents for f/u. Says he still gets CP a couple times per week. No longer to Y due to COVID. But working in yard and digging holes without CP. Says burping will help the pain go away.  Denies reflux symptoms but says says CP frequently occurs when lying down. Has been very stressful year due to family issues.   Carotid u/s 2/12: 40-59% bilat (stable) Cartoid u/s 3/12: LICA 60-79%, RICA 40-59%  Carotid u/s 9/13 40-59% bilaterally  Carotid u/s 1/16 40-59% bilaterally  Carotid u/s 3/17 RICA 60-79%, LICA 40-59% - referred to [facility] Carotid u/s with [facility] in 5/18: 5/18 RICA 40-59% and LICA 1-39%  11/14/12 ABI This led to a noninvasive lower extremity arterial study. This demonstrated noncompressible tibial arteries. His ABIs were 1.1 on the right and 1.0 on the left. His toe brachial indices were 0.77 on the right and 0.63 on the left. Waveforms were triphasic. Seen by Dr. Excell Seltzerooper and felt he was OK.   Past Medical History:  Diagnosis Date  . CAD (coronary artery disease)    s/p multiple PCIs to RCA dating back to [id number]. Last stent to proximal RCA in November of 2011. Also had 99% stenosis in the distal PDA with left to right collaterals and nonobstructive disease in the left coronary tree. ;  ISR of RCA 10/12 - CABG 10/9 with Dr. Zenaida NieceVan Trigt: L-LAD, S-OM, S-PDA  . 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 (stable)  . HTN (hypertension)   . Hyperlipidemia    Past Surgical History:  Procedure Laterality Date  . Coronary artery bypass grafting x4 (left internal mammary artery  06/23/2011  . CORONARY STENT PLACEMENT  Nov 2011  prox RCA. Has had prior multiple procedures.  Remus Blake ANGIOGRAPHY N/A 05/04/2019   Procedure: CORONARY/GRAFT ANGIOGRAPHY;  Surgeon: Jolaine Artist, MD;  Location: [facility];  Service: Cardiovascular;  Laterality: N/A;  . left total hip replacement  2008  . TOOTH EXTRACTION  06/2013     Current Outpatient Medications  Medication Sig Dispense Refill  . acetaminophen (TYLENOL) 325 MG tablet Take 650 mg by mouth every 6 (six) hours as needed for mild pain or headache.    Marland Kitchen amLODipine (NORVASC) 5 MG tablet Take 5 mg by mouth  daily.    Marland Kitchen amoxicillin (AMOXIL) 250 MG capsule Take 250 mg by mouth as directed. 4 tablets 1 hour prior to having dental procedures    . aspirin 81 MG tablet Take 81 mg by mouth daily.    Marland Kitchen atorvastatin (LIPITOR) 80 MG tablet TAKE 1 TABLET ONCE DAILY. 90 tablet 2  . ezetimibe (ZETIA) 10 MG tablet TAKE 1 TABLET BY MOUTH DAILY. 90 tablet 0  . ibuprofen (ADVIL) 200 MG tablet Take 400 mg by mouth every 6 (six) hours as needed (pain).    Marland Kitchen losartan (COZAAR) 100 MG tablet TAKE ONE TABLET BY MOUTH DAILY 90 tablet 3  . nitroGLYCERIN (NITROSTAT) 0.4 MG SL tablet Place 0.4 mg under the tongue every 5 (five) minutes as needed for chest pain.    . Omega-3 Fatty Acids (FISH OIL) 1000 MG CAPS Take 2,000 mg by mouth daily.    Marland Kitchen spironolactone (ALDACTONE) 25 MG tablet Take 12.5 mg by mouth daily.    . Tamsulosin HCl (FLOMAX) 0.4 MG CAPS Take 0.8 mg by mouth at bedtime.      No current facility-administered medications for this encounter.     Allergies:   Morphine   Social History:  The patient  reports that he has never smoked. He has never used smokeless tobacco. He reports current alcohol use of about 6.0 standard drinks of alcohol per week. He reports that he does not use drugs.   Family History:  The patient's family history includes Heart disease in his father.   ROS:  Please see the history of present illness.   All other systems are personally reviewed and negative.   Vitals:   06/13/19 1216  BP: (!) 151/51  Pulse: 71  SpO2: 97%    Exam:   General:  Well appearing. No resp difficulty HEENT: normal Neck: supple. no JVD. Carotids 2+ bilat; no bruits. No lymphadenopathy or thryomegaly appreciated. Cor: PMI nondisplaced. Regular rate & rhythm. No rubs, gallops or murmurs. Lungs: clear Abdomen: soft, nontender, nondistended. No hepatosplenomegaly. No bruits or masses. Good bowel sounds. Extremities: no cyanosis, clubbing, rash, edema Neuro: alert & orientedx3, cranial nerves grossly intact.  moves all 4 extremities w/o difficulty. Affect pleasant   Recent Labs: 05/04/2019: BUN 11; Creatinine, Ser 0.80; Hemoglobin 14.0; Platelets 228; Potassium 3.8; Sodium 139  Personally reviewed   Wt Readings from Last 3 Encounters:  06/13/19 88.8 kg (195 lb 12.8 oz)  05/04/19 88.5 kg (195 lb)  07/03/18 86.9 kg (191 lb 9.6 oz)      ASSESSMENT AND PLAN:  1. CAD  S/p CABG 2012 - recent cath with stable revascularization - still with intermittent CP but sounds non-anginal - ? GERD vs emotional stress - start PPI - Continue statin and ASA. Unable to toelrate b-blocker due to fatigue and bradycardia - resume exercise when possible   2. HTN - SBP 140/60s at home - Continue losartan 100 mg daily. -  Increase amlodipine to 10 mg daily.  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 CCA with >50% hemodynamically significant plaque.  - Remains asymptomatic. Follows with Dr. Myra Gianotti  4. HL - Continue atorva 80    Signed, Arvilla Meres, MD  06/13/2019 12:35 PM  Advanced [facility] Failure Clinic [facility] [address] [facility] [place] [address] [phone] (office) [phone] (fax)

## 2019-06-13 NOTE — Patient Instructions (Signed)
Medication Changes:  Amlodipine increased to 10mg  ( 1 tablet ) Daily.  Protonix 40mg  ( 1 tablet ) Daily.   At the [facility], you and your health needs are our priority. As part of our continuing mission to provide you with exceptional heart care, we have created designated Provider Care Teams. These Care Teams include your primary Cardiologist (physician) and Advanced Practice Providers (APPs- Physician Assistants and Nurse Practitioners) who all work together to provide you with the care you need, when you need it.   You may see any of the following providers on your designated Care Team at your next follow up: Marland Kitchen Dr Glori Bickers . Dr Loralie Champagne . Darrick Grinder, NP   Please be sure to bring in all your medications bottles to every appointment.

## 2019-06-21 DIAGNOSIS — R69 Illness, unspecified: Secondary | ICD-10-CM | POA: Diagnosis not present

## 2019-06-28 DIAGNOSIS — Z125 Encounter for screening for malignant neoplasm of prostate: Secondary | ICD-10-CM | POA: Diagnosis not present

## 2019-06-28 DIAGNOSIS — E7800 Pure hypercholesterolemia, unspecified: Secondary | ICD-10-CM | POA: Diagnosis not present

## 2019-06-29 DIAGNOSIS — I10 Essential (primary) hypertension: Secondary | ICD-10-CM | POA: Diagnosis not present

## 2019-06-29 DIAGNOSIS — R82998 Other abnormal findings in urine: Secondary | ICD-10-CM | POA: Diagnosis not present

## 2019-07-02 DIAGNOSIS — R69 Illness, unspecified: Secondary | ICD-10-CM | POA: Diagnosis not present

## 2019-07-03 DIAGNOSIS — M1990 Unspecified osteoarthritis, unspecified site: Secondary | ICD-10-CM | POA: Diagnosis not present

## 2019-07-03 DIAGNOSIS — M722 Plantar fascial fibromatosis: Secondary | ICD-10-CM | POA: Diagnosis not present

## 2019-07-03 DIAGNOSIS — D692 Other nonthrombocytopenic purpura: Secondary | ICD-10-CM | POA: Diagnosis not present

## 2019-07-03 DIAGNOSIS — H9190 Unspecified hearing loss, unspecified ear: Secondary | ICD-10-CM | POA: Diagnosis not present

## 2019-07-03 DIAGNOSIS — I2510 Atherosclerotic heart disease of native coronary artery without angina pectoris: Secondary | ICD-10-CM | POA: Diagnosis not present

## 2019-07-03 DIAGNOSIS — E669 Obesity, unspecified: Secondary | ICD-10-CM | POA: Diagnosis not present

## 2019-07-03 DIAGNOSIS — Z1331 Encounter for screening for depression: Secondary | ICD-10-CM | POA: Diagnosis not present

## 2019-07-03 DIAGNOSIS — I119 Hypertensive heart disease without heart failure: Secondary | ICD-10-CM | POA: Diagnosis not present

## 2019-07-03 DIAGNOSIS — E7800 Pure hypercholesterolemia, unspecified: Secondary | ICD-10-CM | POA: Diagnosis not present

## 2019-07-03 DIAGNOSIS — Z0000 Encounter for general adult medical examination without abnormal findings: Secondary | ICD-10-CM | POA: Diagnosis not present

## 2019-07-04 DIAGNOSIS — Z1212 Encounter for screening for malignant neoplasm of rectum: Secondary | ICD-10-CM | POA: Diagnosis not present

## 2019-07-06 ENCOUNTER — Other Ambulatory Visit (HOSPITAL_COMMUNITY): Payer: Self-pay | Admitting: Internal Medicine

## 2019-07-11 ENCOUNTER — Other Ambulatory Visit (HOSPITAL_COMMUNITY): Payer: Self-pay | Admitting: Internal Medicine

## 2019-08-12 ENCOUNTER — Other Ambulatory Visit (HOSPITAL_COMMUNITY): Payer: Self-pay | Admitting: Internal Medicine

## 2019-08-16 DIAGNOSIS — R69 Illness, unspecified: Secondary | ICD-10-CM | POA: Diagnosis not present

## 2019-08-27 DIAGNOSIS — H2513 Age-related nuclear cataract, bilateral: Secondary | ICD-10-CM | POA: Diagnosis not present

## 2019-08-27 DIAGNOSIS — H524 Presbyopia: Secondary | ICD-10-CM | POA: Diagnosis not present

## 2019-09-11 ENCOUNTER — Ambulatory Visit: Payer: Medicare HMO | Attending: Internal Medicine

## 2019-09-17 ENCOUNTER — Ambulatory Visit: Payer: Medicare HMO | Attending: Internal Medicine

## 2019-09-17 ENCOUNTER — Other Ambulatory Visit (HOSPITAL_COMMUNITY): Payer: Self-pay | Admitting: Internal Medicine

## 2019-09-17 DIAGNOSIS — U071 Other skin changes: Secondary | ICD-10-CM

## 2019-09-17 DIAGNOSIS — R238 Other skin changes: Secondary | ICD-10-CM

## 2019-09-18 LAB — NOVEL CORONAVIRUS, NAA: SARS-CoV-2, NAA: NOT DETECTED

## 2019-10-09 ENCOUNTER — Other Ambulatory Visit (HOSPITAL_COMMUNITY): Payer: Self-pay | Admitting: Internal Medicine

## 2019-10-18 ENCOUNTER — Other Ambulatory Visit (HOSPITAL_COMMUNITY): Payer: Self-pay | Admitting: Internal Medicine

## 2019-10-27 ENCOUNTER — Ambulatory Visit: Payer: Medicare HMO | Attending: Internal Medicine

## 2019-10-27 DIAGNOSIS — Z23 Encounter for immunization: Secondary | ICD-10-CM | POA: Insufficient documentation

## 2019-10-27 NOTE — Progress Notes (Signed)
   Covid-19 Vaccination Clinic  Name:  [name]    MRN: [id number] DOB: 1941/04/19  10/27/2019  [name] was observed post Covid-19 immunization for 15 minutes without incidence. He was provided with Vaccine Information Sheet and instruction to access the V-Safe system.   [name] was instructed to call 911 with any severe reactions post vaccine: Marland Kitchen Difficulty breathing  . Swelling of your face and throat  . A fast heartbeat  . A bad rash all over your body  . Dizziness and weakness    Immunizations Administered    Name Date Dose VIS Date Route   Pfizer COVID-19 Vaccine 10/27/2019  1:58 PM 0.3 mL 08/24/2019 Intramuscular   Manufacturer: [employer], [employer]   Lot: [id number]   NDC: [address]-4

## 2019-11-19 ENCOUNTER — Ambulatory Visit: Payer: Medicare HMO | Attending: Internal Medicine

## 2019-11-19 DIAGNOSIS — Z23 Encounter for immunization: Secondary | ICD-10-CM

## 2019-11-19 NOTE — Progress Notes (Signed)
   Covid-19 Vaccination Clinic  Name:  [name]    MRN: [id number] DOB: October 31, 1940  11/19/2019  Mr. [name] was observed post Covid-19 immunization for 15 minutes without incident. He was provided with Vaccine Information Sheet and instruction to access the V-Safe system.   Mr. [name] was instructed to call 911 with any severe reactions post vaccine: Marland Kitchen Difficulty breathing  . Swelling of face and throat  . A fast heartbeat  . A bad rash all over body  . Dizziness and weakness   Immunizations Administered    Name Date Dose VIS Date Route   Pfizer COVID-19 Vaccine 11/19/2019 11:07 AM 0.3 mL 08/24/2019 Intramuscular   Manufacturer: [employer], [employer]   Lot: [id number]   NDC: [address]-9

## 2019-12-01 ENCOUNTER — Other Ambulatory Visit (HOSPITAL_COMMUNITY): Payer: Self-pay | Admitting: Internal Medicine

## 2020-01-13 ENCOUNTER — Other Ambulatory Visit (HOSPITAL_COMMUNITY): Payer: Self-pay | Admitting: Internal Medicine

## 2020-01-23 DIAGNOSIS — M1611 Unilateral primary osteoarthritis, right hip: Secondary | ICD-10-CM | POA: Diagnosis not present

## 2020-01-23 DIAGNOSIS — M545 Low back pain: Secondary | ICD-10-CM | POA: Diagnosis not present

## 2020-01-29 ENCOUNTER — Other Ambulatory Visit (HOSPITAL_COMMUNITY): Payer: Self-pay | Admitting: Internal Medicine

## 2020-02-06 DIAGNOSIS — K4090 Unilateral inguinal hernia, without obstruction or gangrene, not specified as recurrent: Secondary | ICD-10-CM | POA: Diagnosis not present

## 2020-02-20 DIAGNOSIS — M1611 Unilateral primary osteoarthritis, right hip: Secondary | ICD-10-CM | POA: Diagnosis not present

## 2020-02-25 ENCOUNTER — Telehealth (HOSPITAL_COMMUNITY): Payer: Self-pay

## 2020-02-25 NOTE — Telephone Encounter (Signed)
Received a medical clearance form from [facility] for [employer]. Medical clearance form signed and faxed back successfully to [phone] attn Sherri

## 2020-03-07 ENCOUNTER — Ambulatory Visit: Payer: Self-pay | Admitting: Surgery

## 2020-03-07 DIAGNOSIS — K4090 Unilateral inguinal hernia, without obstruction or gangrene, not specified as recurrent: Secondary | ICD-10-CM | POA: Diagnosis not present

## 2020-03-07 NOTE — H&P (Signed)
Newman Nickels Appointment: 03/07/2020 10:10 AM Location: [facility] Patient #: [id number] DOB: [birth year] Married / Language: Cleophus Molt / Race: White Male  History of Present Illness Marcello Moores A. Yesena Reaves MD; 03/07/2020 10:52 AM) Patient words: Patient presents for evaluation of right groin pain. He's had it for a number of months. Location is right groin and activity makes this worse especially bending over, lifting, pushing or pulling. The pain is episodic and sharp in nature. He does feel a bulge in his right groin is well. Denies any left groin pain.  The patient is a 79 year old male.   Past Surgical History Darden Palmer, [place]; 03/07/2020 10:25 AM) Coronary Artery Bypass Graft Hip Surgery Left.  Allergies Darden Palmer, [place]; 03/07/2020 10:26 AM) Morphine Sulfate (Concentrate) *ANALGESICS - OPIOID*  Medication History Darden Palmer, RMA; 03/07/2020 10:29 AM) amLODIPine Besylate (10MG  Tablet, Oral) Active. Atorvastatin Calcium (80MG  Tablet, Oral) Active. Pantoprazole Sodium (40MG  Tablet DR, Oral) Active. Spironolactone (25MG  Tablet, Oral) Active. Tamsulosin HCl (0.4MG  Capsule, Oral) Active. Losartan Potassium (100MG  Tablet, Oral) Active. Ezetimibe (10MG  Tablet, Oral) Active. Nitroglycerin (0.4MG  Tab Sublingual, Sublingual) Active. Omega-3 EPA Fish Oil (1205MG  Capsule, Oral) Active.  Social History Darden Palmer, [place]; 03/07/2020 10:25 AM) Alcohol use Occasional alcohol use. Caffeine use Carbonated beverages, Coffee. No drug use Tobacco use Never smoker.  Family History Darden Palmer, [place]; 03/07/2020 10:25 AM) Colon Polyps Father. Heart Disease Father. Hypertension Father. Melanoma Father.  Other Problems Darden Palmer, [place]; 03/07/2020 10:25 AM) Arthritis Back Pain Chest pain Enlarged Prostate High blood pressure Hypercholesterolemia     Review of Systems (Shlomo Seres A. Jacson Rapaport MD; 03/07/2020 10:54 AM) General Not Present- Appetite  Loss, Chills, Fatigue, Fever, Night Sweats, Weight Gain and Weight Loss. HEENT Present- Wears glasses/contact lenses. Not Present- Earache, Hearing Loss, Hoarseness, Nose Bleed, Oral Ulcers, Ringing in the Ears, Seasonal Allergies, Sinus Pain, Sore Throat, Visual Disturbances and Yellow Eyes. Gastrointestinal Not Present- Abdominal Pain, Bloating, Bloody Stool, Change in Bowel Habits, Chronic diarrhea, Constipation, Difficulty Swallowing, Excessive gas, Gets full quickly at meals, Hemorrhoids, Indigestion, Nausea, Rectal Pain and Vomiting. Male Genitourinary Present- Frequency. Not Present- Blood in Urine, Change in Urinary Stream, Impotence, Nocturia, Painful Urination, Urgency and Urine Leakage. Musculoskeletal Present- Back Pain. Not Present- Joint Pain, Joint Stiffness, Muscle Pain, Muscle Weakness and Swelling of Extremities. Psychiatric Not Present- Anxiety, Bipolar, Change in Sleep Pattern, Depression, Fearful and Frequent crying. Endocrine Not Present- Cold Intolerance, Excessive Hunger, Hair Changes, Heat Intolerance, Hot flashes and New Diabetes. Hematology Not Present- Blood Thinners, Easy Bruising, Excessive bleeding, Gland problems, HIV and Persistent Infections. All other systems negative  Vitals Lattie Haw Caldwell RMA; 03/07/2020 10:30 AM) 03/07/2020 10:29 AM Weight: 197.13 lb Height: 67in Body Surface Area: 2.01 m Body Mass Index: 30.87 kg/m  Temp.: 98.62F  Pulse: 87 (Regular)  BP: 160/74(Sitting, Left Arm, Standard)        Physical Exam (Trinidy Masterson A. Iviana Blasingame MD; 03/07/2020 10:53 AM)  General Mental Status-Alert. General Appearance-Consistent with stated age. Hydration-Well hydrated. Voice-Normal.  Head and Neck Head-normocephalic, atraumatic with no lesions or palpable masses. Trachea-midline. Thyroid Gland Characteristics - normal size and consistency.  Eye Eyeball - Bilateral-Extraocular movements intact. Sclera/Conjunctiva - Bilateral-No  scleral icterus.  Chest and Lung Exam Chest and lung exam reveals -quiet, even and easy respiratory effort with no use of accessory muscles and on auscultation, normal breath sounds, no adventitious sounds and normal vocal resonance. Inspection Chest Wall - Normal. Back - normal.  Breast Breast - Left-Symmetric, Non Tender, No Biopsy scars, no Dimpling - Left,  No Inflammation, No Lumpectomy scars, No Mastectomy scars, No Peau d' Orange. Breast - Right-Symmetric, Non Tender, No Biopsy scars, no Dimpling - Right, No Inflammation, No Lumpectomy scars, No Mastectomy scars, No Peau d' Orange. Breast Lump-No Palpable Breast Mass.  Cardiovascular Cardiovascular examination reveals -normal heart sounds, regular rate and rhythm with no murmurs and normal pedal pulses bilaterally.  Abdomen Inspection Skin - Scar - no surgical scars. Palpation/Percussion Palpation and Percussion of the abdomen reveal - Soft, Non Tender, No Rebound tenderness, No Rigidity (guarding) and No hepatosplenomegaly. Auscultation Auscultation of the abdomen reveals - Bowel sounds normal. Note: Small reducible right inguinal hernia. No evidence of left inguinal hernia.  Neurologic Neurologic evaluation reveals -alert and oriented x 3 with no impairment of recent or remote memory. Mental Status-Normal.  Musculoskeletal Normal Exam - Left-Upper Extremity Strength Normal and Lower Extremity Strength Normal. Normal Exam - Right-Upper Extremity Strength Normal and Lower Extremity Strength Normal.  Lymphatic Head & Neck  General Head & Neck Lymphatics: Bilateral - Description - Normal. Axillary  General Axillary Region: Bilateral - Description - Normal. Tenderness - Non Tender. Femoral & Inguinal  Generalized Femoral & Inguinal Lymphatics: Bilateral - Description - Normal. Tenderness - Non Tender.    Assessment & Plan (Masayuki Sakai A. Olive Motyka MD; 03/07/2020 10:54 AM)  RIGHT INGUINAL HERNIA  (K40.90) Impression: Reducible symptomatic right inguinal hernia. Patient desires repair. The risk of hernia repair include bleeding, infection, organ injury, bowel injury, bladder injury, nerve injury recurrent hernia, blood clots, worsening of underlying condition, chronic pain, mesh use, open surgery, death, and the need for other operations. Pt agrees to proceed Total time 45 minutes discussing surgery, complications, Dr. dictation, examination, reviewing the chart.  Current Plans You are being scheduled for surgery- Our schedulers will call you.  You should hear from our office's scheduling department within 5 working days about the location, date, and time of surgery. We try to make accommodations for patient's preferences in scheduling surgery, but sometimes the OR schedule or the surgeon's schedule prevents [place] from making those accommodations.  If you have not heard from our office [phone]) in 5 working days, call the office and ask for your surgeon's nurse.  If you have other questions about your diagnosis, plan, or surgery, call the office and ask for your surgeon's nurse.  Pt Education - Pamphlet Given - Hernia Surgery: discussed with patient and provided information. Pt Education - CCS Mesh education: discussed with patient and provided information. Pt Education - Consent for inguinal hernia - [place]: discussed with patient and provided information. The anatomy & physiology of the abdominal wall and pelvic floor was discussed. The pathophysiology of hernias in the inguinal and pelvic region was discussed. Natural history risks such as progressive enlargement, pain, incarceration, and strangulation was discussed. Contributors to complications such as smoking, obesity, diabetes, prior surgery, etc were discussed.  I feel the risks of no intervention will lead to serious problems that outweigh the operative risks; therefore, I recommended surgery to reduce and repair the  hernia. I explained r an open approach. I noted usual use of mesh to patch and/or buttress hernia repair  Risks such as bleeding, infection, abscess, need for further treatment, heart attack, death, and other risks were discussed. I noted a good likelihood this will help address the problem. Goals of post-operative recovery were discussed as well. Possibility that this will not correct all symptoms was explained. I stressed the importance of low-impact activity, aggressive pain control, avoiding constipation, & not pushing through pain  to minimize risk of post-operative chronic pain or injury. Possibility of reherniation was discussed. We will work to minimize complications.  An educational handout further explaining the pathology & treatment options was given as well. Questions were answered. The patient expresses understanding & wishes to proceed with surgery.

## 2020-03-28 NOTE — Progress Notes (Signed)
Patient scheduled for PAT visit on 04-01-20.  Need orders for procedure scheduled for 04-08-20

## 2020-03-28 NOTE — Progress Notes (Addendum)
COVID Vaccine Completed: Date COVID Vaccine completed: COVID vaccine manufacturer: [employer]   PCP - Guerry Bruin, MD w/ surgery clearance dated 02-28-20 on chart Cardiologist - Arvilla Meres, MD LOV 06-13-19 w/ cardiac clearance on chart dated 02-22-20  Chest x-ray -  EKG -  Stress Test - 04-27-17  ECHO -  Cardiac Cath -   Sleep Study -  CPAP -   Fasting Blood Sugar -  Checks Blood Sugar _____ times a day  Blood Thinner Instructions: Aspirin Instructions: 81mg  w/ instructions to hold 5 days prior to surgery per clearance. Last Dose:  ADL w/o SOB  Anesthesia review:   Patient denies shortness of breath, fever, cough and chest pain at PAT appointment   Patient verbalized understanding of instructions that were given to them at the PAT appointment. Patient was also instructed that they will need to review over the PAT instructions again at home before surgery.

## 2020-03-28 NOTE — Progress Notes (Signed)
In addition to surgical orders, please fax cardiac clearance to [phone]. Thank you

## 2020-03-28 NOTE — Patient Instructions (Addendum)
DUE TO COVID-19 ONLY ONE VISITOR ARE ALLOWED TO COME WITH YOU AND STAY IN THE WAITING ROOM ONLY DURING PRE OP AND  PROCEDURE. THEN TWO VISITORS MAY VISIT WITH YOU IN YOUR PRIVATE ROOM DURING VISITING HOURS ONLY!! (10AM-8PM)   COVID SWAB TESTING MUST BE COMPLETED ON:     04-04-20 [address] [place] [facility] enter pre surgical testing line (Must self quarantine after testing. Follow instructions on handout.)             Your procedure is scheduled on: 04-08-20   Report to [facility] Main  Entrance   Report to admitting at 8:30 AM    Call this number if you have problems the morning of surgery [phone]   Do not eat food or drink liquids :After Midnight.  You may have Clear Liquids until 8:00 AM   day of surgery. Complete one Ensure drink the morning of surgery at 8:00 AM  the day of surgery.     CLEAR LIQUID DIET   Foods Allowed                                                                     Foods Excluded  Coffee and tea, regular and decaf                             liquids that you cannot  Plain Jell-O any favor except red or purple                                           see through such as: Fruit ices (not with fruit pulp)                                     milk, soups, orange juice  Iced Popsicles                                    All solid food Carbonated beverages, regular and diet                                    Cranberry, grape and apple juices Sports drinks like Gatorade Lightly seasoned clear broth or consume(fat free) Sugar, honey syrup   _____________________________________________________________________    Take these medicines the morning of surgery with A SIP OF WATER:  Amolodipine (Norvasc), Atorvastatin (Lipitor), Ezetimibe (Zetia), Pantoprazole  (Protonix),    Oral Hygiene is also important to reduce your risk of infection.                                     Remember - BRUSH YOUR TEETH THE MORNING OF  SURGERY WITH YOUR REGULAR TOOTHPASTE.  AFTERWARDS NO WATER, GUM,  CANDY OR MINTS.   Do NOT smoke after Midnight  You may not have any metal on your body including jewelry, and body piercings              Do not wear lotions, powders, cologne, or deodorant                          Men may shave face and neck.   Do not bring valuables to the hospital. [facility] IS NOT RESPONSIBLE  FOR VALUABLES.   Contacts, dentures or bridgework may not be worn into surgery.   Bring small overnight bag day of surgery.    Special Instructions: Bring a copy of your healthcare power of attorney and living will documents the day of surgery if you haven't scanned them in before.              Please read over the following fact sheets you were given: IF YOU HAVE QUESTIONS ABOUT YOUR PRE OP INSTRUCTIONS PLEASE CALL  [phone]   [place] - Preparing for Surgery Before surgery, you can play an important role.  Because skin is not sterile, your skin needs to be as free of germs as possible.  You can reduce the number of germs on your skin by washing with CHG (chlorahexidine gluconate) soap before surgery.  CHG is an antiseptic cleaner which kills germs and bonds with the skin to continue killing germs even after washing. Please DO NOT use if you have an allergy to CHG or antibacterial soaps.  If your skin becomes reddened/irritated stop using the CHG and inform your nurse when you arrive at Short Stay. Do not shave (including legs and underarms) for at least 48 hours prior to the first CHG shower.  You may shave your face/neck.  Please follow these instructions carefully:  1.  Shower with CHG Soap the night before surgery and the  morning of surgery.  2.  If you choose to wash your hair, wash your hair first as usual with your normal  shampoo.  3.  After you shampoo, rinse your hair and body thoroughly to remove the shampoo.                             4.  Use CHG as you would  any other liquid soap.  You can apply chg directly to the skin and wash.  Gently with a scrungie or clean washcloth.  5.  Apply the CHG Soap to your body ONLY FROM THE NECK DOWN.   Do   not use on face/ open                           Wound or open sores. Avoid contact with eyes, ears mouth and   genitals (private parts).                       Wash face,  Genitals (private parts) with your normal soap.             6.  Wash thoroughly, paying special attention to the area where your    surgery  will be performed.  7.  Thoroughly rinse your body with warm water from the neck down.  8.  DO NOT shower/wash with your normal soap after using and rinsing off the CHG Soap.                9.  Pat yourself dry with a clean towel.            10.  Wear clean pajamas.            11.  Place clean sheets on your bed the night of your first shower and do not  sleep with pets. Day of Surgery : Do not apply any lotions/deodorants the morning of surgery.  Please wear clean clothes to the hospital/surgery center.  FAILURE TO FOLLOW THESE INSTRUCTIONS MAY RESULT IN THE CANCELLATION OF YOUR SURGERY  PATIENT SIGNATURE_________________________________  NURSE SIGNATURE__________________________________  ________________________________________________________________________

## 2020-03-31 DIAGNOSIS — M1611 Unilateral primary osteoarthritis, right hip: Secondary | ICD-10-CM | POA: Diagnosis present

## 2020-04-01 ENCOUNTER — Encounter (HOSPITAL_COMMUNITY)
Admission: RE | Admit: 2020-04-01 | Discharge: 2020-04-01 | Disposition: A | Discharge status: Home / Self Care | Payer: Medicare HMO | Source: Ambulatory Visit | Attending: Orthopedic Surgery | Admitting: Orthopedic Surgery

## 2020-04-01 ENCOUNTER — Encounter (HOSPITAL_COMMUNITY): Payer: Self-pay

## 2020-04-01 ENCOUNTER — Other Ambulatory Visit: Payer: Self-pay

## 2020-04-01 DIAGNOSIS — I10 Essential (primary) hypertension: Secondary | ICD-10-CM | POA: Diagnosis not present

## 2020-04-01 DIAGNOSIS — Z01818 Encounter for other preprocedural examination: Secondary | ICD-10-CM | POA: Diagnosis not present

## 2020-04-01 LAB — BASIC METABOLIC PANEL
Anion gap: 10 (ref 5–15)
BUN: 16 mg/dL (ref 8–23)
CO2: 26 mmol/L (ref 22–32)
Calcium: 9.4 mg/dL (ref 8.9–10.3)
Chloride: 102 mmol/L (ref 98–111)
Creatinine, Ser: 0.97 mg/dL (ref 0.61–1.24)
GFR calc Af Amer: >60 mL/min (ref >60)
GFR calc non Af Amer: >60 mL/min (ref >60)
Glucose, Bld: 130 mg/dL — ABNORMAL HIGH (ref 70–99)
Potassium: 4.7 mmol/L (ref 3.5–5.1)
Sodium: 138 mmol/L (ref 135–145)

## 2020-04-01 LAB — CBC
HCT: 36.1 % — ABNORMAL LOW (ref 39.0–52.0)
Hemoglobin: 12 g/dL — ABNORMAL LOW (ref 13.0–17.0)
MCH: 32.3 pg (ref 26.0–34.0)
MCHC: 33.2 g/dL (ref 30.0–36.0)
MCV: 97 fL (ref 80.0–100.0)
Platelets: 295 10*3/uL (ref 150–400)
RBC: 3.72 MIL/uL — ABNORMAL LOW (ref 4.22–5.81)
RDW: 12.1 % (ref 11.5–15.5)
WBC: 9.7 10*3/uL (ref 4.0–10.5)
nRBC: 0 % (ref 0.0–0.2)

## 2020-04-01 LAB — SURGICAL PCR SCREEN
MRSA, PCR: NEGATIVE
Staphylococcus aureus: NEGATIVE

## 2020-04-04 ENCOUNTER — Encounter (HOSPITAL_COMMUNITY): Payer: Self-pay | Admitting: Physician Assistant

## 2020-04-04 ENCOUNTER — Other Ambulatory Visit (HOSPITAL_COMMUNITY)
Admission: RE | Admit: 2020-04-04 | Discharge: 2020-04-04 | Disposition: A | Discharge status: Home / Self Care | Payer: Medicare HMO | Source: Ambulatory Visit | Attending: Orthopedic Surgery | Admitting: Orthopedic Surgery

## 2020-04-04 ENCOUNTER — Encounter (HOSPITAL_COMMUNITY): Payer: Self-pay | Admitting: Certified Registered"

## 2020-04-04 DIAGNOSIS — Z20822 Contact with and (suspected) exposure to covid-19: Secondary | ICD-10-CM | POA: Insufficient documentation

## 2020-04-04 DIAGNOSIS — Z01812 Encounter for preprocedural laboratory examination: Secondary | ICD-10-CM | POA: Diagnosis not present

## 2020-04-04 LAB — SARS CORONAVIRUS 2 (TAT 6-24 HRS): SARS Coronavirus 2: NEGATIVE

## 2020-04-04 NOTE — Progress Notes (Addendum)
Anesthesia Chart Review   Case: [id number] Date/Time: 04/08/20 1044   Procedure: TOTAL HIP ARTHROPLASTY (Right Hip)   Anesthesia type: Choice   Pre-op diagnosis: djd right hip   Location: [facility] 07 / [facility]   Surgeons: Teryl Lucy, MD      DISCUSSION:79 y.o. never smoker with h/o HTN, HLD, CAD (CABG 2012), carotid stenosis, right hip djd scheduled for above procedure 04/08/2020 with Dr. Teryl Lucy.    Underwent cath 04/2019 with stable anatomy no PCI targets.  Last seen by Dr. Gala Romney 06/13/2019.  Per notes pt stable.  Carotid stenosis stable with 40-59% disease to bilateral carotids, pt remains asymptomatic. Cardiac clearance received from Dr. Gala Romney which states pt is optimized for surgery from a cardiac standpoint only.     Carotid stenosis followed by Dr. Myra Gianotti.  Pt last seen by vascular surgery 01/30/2018.  Carotid Duplex at this time with 40-59% stenosis of bilateral ICA, >50% stenosis of the right distal CCA, right ECA >50% stenosis.  Recommended at this time that patient follow up in 1 year for repeat carotid duplex scan.  Repeat study has not been done.  Pt remains asymptomatic.   Addendum 04/07/2020:  Per Dr. Shelba Flake scheduler pt will be rescheduled.  VS: BP (!) 161/62   Pulse 69   Temp 36.9 C (Oral)   Resp 16   Ht 5\' 7"  (1.702 m)   Wt 90.7 kg   SpO2 98%   BMI 31.32 kg/m   PROVIDERS: Tisovec, , MD is PCP   Adelfa Koh, MD is Cardiologist  LABS: Labs reviewed: Acceptable for surgery. (all labs ordered are listed, but only abnormal results are displayed)  Labs Reviewed - No data to display   IMAGES: Carotid VAS Arvilla Meres 01/30/2018 Final Interpretation:  Right Carotid: Velocities in the right ICA are consistent with a 40-59%         stenosis. Hemodynamically significant plaque >50%  visualized in         the CCA. The ECA appears >50% stenosed.   Left Carotid: Velocities in the left ICA are consistent with a 40-59%  stenosis.    EKG: 04/01/2020 Rate 66 bpm Normal sinus rhythm  No significant change since last tracing   CV: Cardiac Cath 05/04/2019  Ost RCA to Prox RCA lesion is 100% stenosed.  Prox LAD to Mid LAD lesion is 100% stenosed.  Mid Cx lesion is 50% stenosed.  Prox Graft lesion is 100% stenosed.  Origin to Prox Graft lesion is 100% stenosed.    Conclusion:  1. 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 LAD 100% 3. RCA 100% 4. LCX 50% in mid LCX 5. LIMA to LAD widely patent 6. SVG to R PDA widely patent 7. SVG to OM occluded ostially 8. SVG to PL occluded ostially  He has lost 2 grafts but revascularization relatively complete. 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  04/27/2017 ETT  Blood pressure demonstrated a hypertensive response to exercise.  There was no ST segment deviation noted during stress.   1. Mildly decreased exercise tolerance.  2. Hypertensive BP response.  3. No evidence for ischemia by ST segment analysis.    Past Medical History:  Diagnosis Date  . CAD (coronary artery disease)    s/p multiple PCIs to RCA dating back to 70. Last stent to proximal RCA in November of 2011. Also had 99% stenosis in the distal PDA with left to right collaterals and nonobstructive  disease in the left coronary tree. ;  ISR of RCA 10/12 - CABG 10/9 with Dr. Zenaida Niece Trigt: L-LAD, S-OM, S-PDA  . 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 (stable)  . HTN (hypertension)   . Hyperlipidemia     Past Surgical History:  Procedure Laterality Date  . CARDIAC CATHETERIZATION    . Coronary artery bypass grafting x4 (left internal mammary artery  06/23/2011  . CORONARY STENT PLACEMENT  Nov 2011   prox RCA. Has had prior multiple procedures.  Rosalie Doctor ANGIOGRAPHY N/A 05/04/2019   Procedure: CORONARY/GRAFT ANGIOGRAPHY;  Surgeon: Dolores Patty, MD;  Location: [facility] INVASIVE CV LAB;  Service: Cardiovascular;  Laterality:  N/A;  . JOINT REPLACEMENT    . left total hip replacement  2008  . TONSILLECTOMY    . TOOTH EXTRACTION  06/2013    MEDICATIONS: . acetaminophen (TYLENOL) 325 MG tablet  . amLODipine (NORVASC) 10 MG tablet  . amoxicillin (AMOXIL) 500 MG capsule  . aspirin 81 MG tablet  . atorvastatin (LIPITOR) 80 MG tablet  . ezetimibe (ZETIA) 10 MG tablet  . ibuprofen (ADVIL) 200 MG tablet  . losartan (COZAAR) 100 MG tablet  . nitroGLYCERIN (NITROSTAT) 0.4 MG SL tablet  . Omega-3 Fatty Acids (FISH OIL) 1000 MG CAPS  . pantoprazole (PROTONIX) 40 MG tablet  . spironolactone (ALDACTONE) 25 MG tablet  . Tamsulosin HCl (FLOMAX) 0.4 MG CAPS   No current facility-administered medications for this encounter.    Jodell Cipro, PA-C [facility] Pre-Surgical Testing [phone]

## 2020-04-07 ENCOUNTER — Other Ambulatory Visit: Payer: Self-pay

## 2020-04-07 DIAGNOSIS — I6523 Occlusion and stenosis of bilateral carotid arteries: Secondary | ICD-10-CM

## 2020-04-08 ENCOUNTER — Encounter: Payer: Self-pay | Admitting: Physician Assistant

## 2020-04-08 ENCOUNTER — Other Ambulatory Visit: Payer: Self-pay

## 2020-04-08 ENCOUNTER — Ambulatory Visit (HOSPITAL_COMMUNITY): Admission: RE | Admit: 2020-04-08 | Payer: Medicare HMO | Source: Ambulatory Visit | Admitting: Orthopedic Surgery

## 2020-04-08 ENCOUNTER — Ambulatory Visit (HOSPITAL_COMMUNITY)
Admission: RE | Admit: 2020-04-08 | Discharge: 2020-04-08 | Disposition: A | Discharge status: Home / Self Care | Payer: Medicare HMO | Source: Ambulatory Visit | Attending: Vascular Surgery | Admitting: Vascular Surgery

## 2020-04-08 ENCOUNTER — Encounter (HOSPITAL_COMMUNITY): Admission: RE | Payer: Self-pay | Source: Ambulatory Visit

## 2020-04-08 ENCOUNTER — Ambulatory Visit: Payer: Medicare HMO | Admitting: Physician Assistant

## 2020-04-08 VITALS — BP 182/62 | HR 55 | Temp 97.6°F | Resp 20 | Ht 67.0 in | Wt 194.8 lb

## 2020-04-08 DIAGNOSIS — I6523 Occlusion and stenosis of bilateral carotid arteries: Secondary | ICD-10-CM | POA: Diagnosis not present

## 2020-04-08 SURGERY — ARTHROPLASTY, HIP, TOTAL,POSTERIOR APPROACH
Anesthesia: Choice | Site: Hip | Laterality: Right

## 2020-04-08 NOTE — Progress Notes (Signed)
History of Present Illness:  Patient is a 79 y.o. year old male who presents for evaluation of carotid stenosis.  The patient denies symptoms of TIA, amaurosis, or stroke.  The patient is currently on aspirin antiplatelet therapy.    He wasreferred by Dr. Theron Arista evaluation of carotid occlusive disease. Dr. Myra Gianotti evaluated pt initially in April of 2017.  He was scheduled for THA that was canceled yesterday 04/07/2020 by Anesthesiologist until he is cleared by [place] secondary to known ICA stenosis bilaterally.  He was last seen in our office 01/30/2018 with ICA stenosis of 40-59%.  He has been asymptomatic of stroke and TIA.    He is on asa and a statin daily.  Past Medical History:  Diagnosis Date  . CAD (coronary artery disease)    s/p multiple PCIs to RCA dating back to 48. Last stent to proximal RCA in November of 2011. Also had 99% stenosis in the distal PDA with left to right collaterals and nonobstructive disease in the left coronary tree. ;  ISR of RCA 10/12 - CABG 10/9 with Dr. Zenaida Niece Trigt: L-LAD, S-OM, S-PDA  . 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 (stable)  . HTN (hypertension)   . Hyperlipidemia     Past Surgical History:  Procedure Laterality Date  . CARDIAC CATHETERIZATION    . Coronary artery bypass grafting x4 (left internal mammary artery  06/23/2011  . CORONARY STENT PLACEMENT  Nov 2011   prox RCA. Has had prior multiple procedures.  Rosalie Doctor ANGIOGRAPHY N/A 05/04/2019   Procedure: CORONARY/GRAFT ANGIOGRAPHY;  Surgeon: Dolores Patty, MD;  Location: [facility] INVASIVE CV LAB;  Service: Cardiovascular;  Laterality: N/A;  . JOINT REPLACEMENT    . left total hip replacement  2008  . TONSILLECTOMY    . TOOTH EXTRACTION  06/2013     Social History Social History   Tobacco Use  . Smoking status: Never Smoker  . Smokeless tobacco: Never Used  Vaping Use  . Vaping Use: Never used  Substance Use Topics  . Alcohol use: Yes     Alcohol/week: 6.0 standard drinks    Types: 6 Cans of beer per week    Comment: weekly  . Drug use: No    Family History Family History  Problem Relation Age of Onset  . Heart disease Father   . Colon cancer Neg Hx   . Stomach cancer Neg Hx     Allergies  Allergies  Allergen Reactions  . Morphine Nausea Only     Current Outpatient Medications  Medication Sig Dispense Refill  . acetaminophen (TYLENOL) 325 MG tablet Take 650 mg by mouth every 6 (six) hours as needed for mild pain or headache.    Marland Kitchen amLODipine (NORVASC) 10 MG tablet TAKE 1 TABLET ONCE DAILY. (Patient taking differently: Take 10 mg by mouth daily. ) 90 tablet 3  . amoxicillin (AMOXIL) 500 MG capsule Take 2,000 mg by mouth See admin instructions. Take 2000 mg by mouth 1 hour prior to having dental procedures    . aspirin 81 MG tablet Take 81 mg by mouth daily.    Marland Kitchen atorvastatin (LIPITOR) 80 MG tablet TAKE ONE TABLET BY MOUTH DAILY (Patient taking differently: Take 80 mg by mouth daily. ) 90 tablet 3  . ezetimibe (ZETIA) 10 MG tablet TAKE 1 TABLET BY MOUTH DAILY. (Patient taking differently: Take 10 mg by mouth daily. ) 90 tablet 3  . ibuprofen (ADVIL) 200 MG tablet Take 400  mg by mouth every 6 (six) hours as needed for moderate pain.     Marland Kitchen losartan (COZAAR) 100 MG tablet TAKE ONE TABLET BY MOUTH DAILY (Patient taking differently: Take 100 mg by mouth daily. ) 90 tablet 3  . nitroGLYCERIN (NITROSTAT) 0.4 MG SL tablet Place 0.4 mg under the tongue every 5 (five) minutes as needed for chest pain.    . Omega-3 Fatty Acids (FISH OIL) 1000 MG CAPS Take 2,000 mg by mouth daily.    . pantoprazole (PROTONIX) 40 MG tablet TAKE 1 TABLET ONCE DAILY. (Patient taking differently: Take 40 mg by mouth daily. ) 90 tablet 3  . spironolactone (ALDACTONE) 25 MG tablet TAKE (1/2) TABLET DAILY. (Patient taking differently: Take 25 mg by mouth daily. ) 45 tablet 3  . Tamsulosin HCl (FLOMAX) 0.4 MG CAPS Take 0.8 mg by mouth at bedtime.       No current facility-administered medications for this visit.    ROS:   General:  No weight loss, Fever, chills  HEENT: No recent headaches, no nasal bleeding, no visual changes, no sore throat  Neurologic: No dizziness, blackouts, seizures. No recent symptoms of stroke or mini- stroke. No recent episodes of slurred speech, or temporary blindness.  Cardiac: No recent episodes of chest pain/pressure, no shortness of breath at rest.  No shortness of breath with exertion.  Denies history of atrial fibrillation or irregular heartbeat  Vascular: No history of rest pain in feet.  No history of claudication.  No history of non-healing ulcer, No history of DVT   Pulmonary: No home oxygen, no productive cough, no hemoptysis,  No asthma or wheezing  Musculoskeletal:  [ ]  Arthritis, [ ]  Low back pain,  [x ] Joint pain  Hematologic:No history of hypercoagulable state.  No history of easy bleeding.  No history of anemia  Gastrointestinal: No hematochezia or melena,  No gastroesophageal reflux, no trouble swallowing  Urinary: [ ]  chronic Kidney disease, [ ]  on HD - [ ]  MWF or [ ]  TTHS, [ ]  Burning with urination, [ ]  Frequent urination, [ ]  Difficulty urinating;   Skin: No rashes  Psychological: No history of anxiety,  No history of depression   Physical Examination  Vitals:   04/08/20 1008 04/08/20 1013  BP: (!) 183/65 (!) 182/62  Pulse: 55   Resp: 20   Temp: 97.6 F (36.4 C)   TempSrc: Temporal   SpO2: 98%   Weight: 194 lb 12.8 oz (88.4 kg)   Height: 5\' 7"  (1.702 m)     Body mass index is 30.51 kg/m.  General:  Alert and oriented, no acute distress HEENT: Normal Neck: B carotid bruit or JVD Pulmonary: Clear to auscultation bilaterally Cardiac: Regular Rate and Rhythm without murmur Gastrointestinal: Soft, non-tender, non-distended, no mass, no scars Skin: No rash Extremity Pulses:  2+ radial, brachial, femoral, dorsalis pedis, posterior tibial pulses  bilaterally Musculoskeletal: No deformity or edema  Neurologic: Upper and lower extremity motor 5/5 and symmetric  DATA:    Right Carotid Findings:  +----------+--------+--------+--------+-------------------------+--------+       PSV cm/sEDV cm/sStenosisPlaque Description    Comments  +----------+--------+--------+--------+-------------------------+--------+  CCA Prox 106   9                           +----------+--------+--------+--------+-------------------------+--------+  CCA Mid  86   14       heterogenous             +----------+--------+--------+--------+-------------------------+--------+  CCA  Distal102   20       heterogenous             +----------+--------+--------+--------+-------------------------+--------+  ICA Prox 336   73   60-79% heterogenous and calcific      +----------+--------+--------+--------+-------------------------+--------+  ICA Mid  117   29                          +----------+--------+--------+--------+-------------------------+--------+  ICA Distal104   23                          +----------+--------+--------+--------+-------------------------+--------+  ECA    542   12   >50%                     +----------+--------+--------+--------+-------------------------+--------+   +----------+--------+-------+----------------+-------------------+       PSV cm/sEDV cmsDescribe    Arm Pressure (mmHG)  +----------+--------+-------+----------------+-------------------+  Subclavian202   4   Multiphasic, WNL            +----------+--------+-------+----------------+-------------------+   +---------+--------+--+--------+--+---------+  VertebralPSV cm/s86EDV cm/s18Antegrade   +---------+--------+--+--------+--+---------+      Left Carotid Findings:  +----------+--------+--------+--------+------------------+--------+       PSV cm/sEDV cm/sStenosisPlaque DescriptionComments  +----------+--------+--------+--------+------------------+--------+  CCA Prox 127   14                      +----------+--------+--------+--------+------------------+--------+  CCA Mid  101   15       heterogenous         +----------+--------+--------+--------+------------------+--------+  CCA Distal116   18       heterogenous         +----------+--------+--------+--------+------------------+--------+  ICA Prox 343   75   60-79% calcific           +----------+--------+--------+--------+------------------+--------+  ICA Mid  256   47                      +----------+--------+--------+--------+------------------+--------+  ICA Distal96   19                      +----------+--------+--------+--------+------------------+--------+  ECA    153   4                       +----------+--------+--------+--------+------------------+--------+   +----------+--------+--------+----------------+-------------------+       PSV cm/sEDV cm/sDescribe    Arm Pressure (mmHG)  +----------+--------+--------+----------------+-------------------+  [id number]   8    Multiphasic, WNL            +----------+--------+--------+----------------+-------------------+   +---------+--------+---+--------+--+---------+  VertebralPSV cm/s100EDV cm/s21Antegrade  +---------+--------+---+--------+--+---------+         Summary:  Right Carotid: Velocities in the right ICA are consistent with a 60-79%         stenosis.   Left Carotid: Velocities in the left ICA are  consistent with a 60-79%  stenosis.   Vertebrals: Bilateral vertebral arteries demonstrate antegrade flow.     ASSESSMENT:  Asymptomatic  ICA stenosis B carotid now 60-79% with velocities > 300.     PLAN:  He is cleared for THA from a carotid stenosis indication.  No carotid intervention is recommended unless he is symptomatic or has carotid stenosis > 80%.   I will bring him back in 6 months for repeat carotid duplex studies.  We reviewed symptoms of stroke and TIA.  If he has these symptoms he will call 911.  Continue activity as tolerates without restrictions.  Recommend continued Aspirin and Statin daily.   Mosetta PigeonEmma Maureen Phelix Fudala PA-C [facility] Office: [phone]  MD in clinic [place]

## 2020-04-11 NOTE — Patient Instructions (Addendum)
DUE TO COVID-19 ONLY ONE VISITOR ARE ALLOWED TO COME WITH YOU AND STAY IN THE WAITING ROOM ONLY DURING PRE OP AND PROCEDURE. THEN TWO VISITORS MAY VISIT WITH YOU IN YOUR PRIVATE ROOM DURING VISITING HOURS ONLY!! (10AM-8PM)   COVID SWAB TESTING MUST BE COMPLETED ON:     Friday, 04-18-20 @ 2:45 AM         [address] [place], [place] [address]  (Must self quarantine after testing. Follow instructions on handout.)        Your procedure is scheduled on:  04-22-20   Report to [facility] Main  Entrance    Report to admitting at 8:15 AM   Call this number if you have problems the morning of surgery [phone]   NO SOLID FOOD AFTER MIDNIGHT THE NIGHT PRIOR TO SURGERY. NOTHING BY MOUTH EXCEPT CLEAR LIQUIDS UNTIL 7:45 AM . PLEASE FINISH ENSURE DRINK PER SURGEON ORDER  WHICH NEEDS TO BE COMPLETED AT 7:45 AM.   CLEAR LIQUID DIET   Foods Allowed                                                                     Foods Excluded  Coffee and tea, regular and decaf                             liquids that you cannot  Plain Jell-O any favor except red or purple                                           see through such as: Fruit ices (not with fruit pulp)                                     milk, soups, orange juice  Iced Popsicles                                    All solid food Carbonated beverages, regular and diet                                    Cranberry, grape and apple juices Sports drinks like Gatorade Lightly seasoned clear broth or consume(fat free) Sugar, honey syrup   _____________________________________________________________________  Take these medicines the morning of surgery with A SIP OF WATER:   Amlodipine (Norvasc), Atorvastatin (Lipitor), Ezetimibe (Zetia), Pantoprazole (Protonix)    Oral Hygiene is also important to reduce your risk of infection.                                     Remember - BRUSH YOUR TEETH THE MORNING OF SURGERY WITH YOUR REGULAR  TOOTHPASTE.  AFTERWARDS NO WATER, GUM,  CANDY OR MINTS   Do NOT smoke after Midnight  You may not have any metal on your body including hair pins, jewelry, and body piercings              Do not wear make-up, lotions, powders, perfumes/cologne, or deodorant              Men may shave face and neck.   Do not bring valuables to the hospital. [facility] IS NOT  RESPONSIBLE   FOR VALUABLES.   Contacts, dentures or bridgework may not be worn into surgery.   Bring small overnight bag day of surgery.                Please read over the following fact sheets you were given: IF YOU HAVE QUESTIONS ABOUT YOUR PRE OP INSTRUCTIONS PLEASE CALL  [phone]   [facility] - Preparing for Surgery Before surgery, you can play an important role.  Because skin is not sterile, your skin needs to be as free of germs as possible.  You can reduce the number of germs on your skin by washing with CHG (chlorahexidine gluconate) soap before surgery.  CHG is an antiseptic cleaner which kills germs and bonds with the skin to continue killing germs even after washing. Please DO NOT use if you have an allergy to CHG or antibacterial soaps.  If your skin becomes reddened/irritated stop using the CHG and inform your nurse when you arrive at Short Stay. Do not shave (including legs and underarms) for at least 48 hours prior to the first CHG shower.  You may shave your face/neck.  Please follow these instructions carefully:  1.  Shower with CHG Soap the night before surgery and the  morning of surgery.  2.  If you choose to wash your hair, wash your hair first as usual with your normal  shampoo.  3.  After you shampoo, rinse your hair and body thoroughly to remove the shampoo.                             4.  Use CHG as you would any other liquid soap.  You can apply chg directly to the skin and wash.  Gently with a scrungie or clean washcloth.  5.  Apply the CHG Soap to your body ONLY  FROM THE NECK DOWN.   Do   not use on face/ open                           Wound or open sores. Avoid contact with eyes, ears mouth and   genitals (private parts).                       Wash face,  Genitals (private parts) with your normal soap.             6.  Wash thoroughly, paying special attention to the area where your    surgery  will be performed.  7.  Thoroughly rinse your body with warm water from the neck down.  8.  DO NOT shower/wash with your normal soap after using and rinsing off the CHG Soap.                9.  Pat yourself dry with a clean towel.            10.  Wear clean pajamas.  11.  Place clean sheets on your bed the night of your first shower and do not  sleep with pets. Day of Surgery : Do not apply any lotions/deodorants the morning of surgery.  Please wear clean clothes to the hospital/surgery center.  FAILURE TO FOLLOW THESE INSTRUCTIONS MAY RESULT IN THE CANCELLATION OF YOUR SURGERY  PATIENT SIGNATURE_________________________________  NURSE SIGNATURE__________________________________  ________________________________________________________________________   [name]  An incentive spirometer is a tool that can help keep your lungs clear and active. This tool measures how well you are filling your lungs with each breath. Taking long deep breaths may help reverse or decrease the chance of developing breathing (pulmonary) problems (especially infection) following:  A long period of time when you are unable to move or be active. BEFORE THE PROCEDURE   If the spirometer includes an indicator to show your best effort, your nurse or respiratory therapist will set it to a desired goal.  If possible, sit up straight or lean slightly forward. Try not to slouch.  Hold the incentive spirometer in an upright position. INSTRUCTIONS FOR USE  1. Sit on the edge of your bed if possible, or sit up as far as you can in bed or on a chair. 2. Hold the  incentive spirometer in an upright position. 3. Breathe out normally. 4. Place the mouthpiece in your mouth and seal your lips tightly around it. 5. Breathe in slowly and as deeply as possible, raising the piston or the ball toward the top of the column. 6. Hold your breath for 3-5 seconds or for as long as possible. Allow the piston or ball to fall to the bottom of the column. 7. Remove the mouthpiece from your mouth and breathe out normally. 8. Rest for a few seconds and repeat Steps 1 through 7 at least 10 times every 1-2 hours when you are awake. Take your time and take a few normal breaths between deep breaths. 9. The spirometer may include an indicator to show your best effort. Use the indicator as a goal to work toward during each repetition. 10. After each set of 10 deep breaths, practice coughing to be sure your lungs are clear. If you have an incision (the cut made at the time of surgery), support your incision when coughing by placing a pillow or rolled up towels firmly against it. Once you are able to get out of bed, walk around indoors and cough well. You may stop using the incentive spirometer when instructed by your caregiver.  RISKS AND COMPLICATIONS  Take your time so you do not get dizzy or light-headed.  If you are in pain, you may need to take or ask for pain medication before doing incentive spirometry. It is harder to take a deep breath if you are having pain. AFTER USE  Rest and breathe slowly and easily.  It can be helpful to keep track of a log of your progress. Your caregiver can provide you with a simple table to help with this. If you are using the spirometer at home, follow these instructions: SEEK MEDICAL CARE IF:   You are having difficultly using the spirometer.  You have trouble using the spirometer as often as instructed.  Your pain medication is not giving enough relief while using the spirometer.  You develop fever of 100.5 F (38.1 C) or  higher. SEEK IMMEDIATE MEDICAL CARE IF:   You cough up bloody sputum that had not been present before.  You develop fever of 102 F (38.9  C) or greater.  You develop worsening pain at or near the incision site. MAKE SURE YOU:   Understand these instructions.  Will watch your condition.  Will get help right away if you are not doing well or get worse. Document Released: 01/10/2007 Document Revised: 11/22/2011 Document Reviewed: 03/13/2007 [facility] Patient Information 2014 [place], [place].   ________________________________________________________________________

## 2020-04-14 ENCOUNTER — Other Ambulatory Visit: Payer: Self-pay | Admitting: Internal Medicine

## 2020-04-15 ENCOUNTER — Other Ambulatory Visit (HOSPITAL_COMMUNITY): Payer: Self-pay | Admitting: *Deleted

## 2020-04-15 ENCOUNTER — Encounter (HOSPITAL_COMMUNITY)
Admission: RE | Admit: 2020-04-15 | Discharge: 2020-04-15 | Disposition: A | Discharge status: Home / Self Care | Payer: Medicare HMO | Source: Ambulatory Visit | Attending: Orthopedic Surgery | Admitting: Orthopedic Surgery

## 2020-04-15 ENCOUNTER — Encounter (HOSPITAL_COMMUNITY): Payer: Self-pay

## 2020-04-15 ENCOUNTER — Other Ambulatory Visit: Payer: Self-pay

## 2020-04-15 DIAGNOSIS — Z01812 Encounter for preprocedural laboratory examination: Secondary | ICD-10-CM | POA: Insufficient documentation

## 2020-04-15 LAB — CBC
HCT: 38.7 % — ABNORMAL LOW (ref 39.0–52.0)
Hemoglobin: 12.8 g/dL — ABNORMAL LOW (ref 13.0–17.0)
MCH: 32.6 pg (ref 26.0–34.0)
MCHC: 33.1 g/dL (ref 30.0–36.0)
MCV: 98.5 fL (ref 80.0–100.0)
Platelets: 258 10*3/uL (ref 150–400)
RBC: 3.93 MIL/uL — ABNORMAL LOW (ref 4.22–5.81)
RDW: 12.7 % (ref 11.5–15.5)
WBC: 8 10*3/uL (ref 4.0–10.5)
nRBC: 0 % (ref 0.0–0.2)

## 2020-04-15 LAB — SURGICAL PCR SCREEN
MRSA, PCR: NEGATIVE
Staphylococcus aureus: NEGATIVE

## 2020-04-15 LAB — BASIC METABOLIC PANEL
Anion gap: 6 (ref 5–15)
BUN: 21 mg/dL (ref 8–23)
CO2: 27 mmol/L (ref 22–32)
Calcium: 9.1 mg/dL (ref 8.9–10.3)
Chloride: 105 mmol/L (ref 98–111)
Creatinine, Ser: 1.11 mg/dL (ref 0.61–1.24)
GFR calc Af Amer: >60 mL/min (ref >60)
GFR calc non Af Amer: >60 mL/min (ref >60)
Glucose, Bld: 102 mg/dL — ABNORMAL HIGH (ref 70–99)
Potassium: 5.1 mmol/L (ref 3.5–5.1)
Sodium: 138 mmol/L (ref 135–145)

## 2020-04-18 ENCOUNTER — Other Ambulatory Visit (HOSPITAL_COMMUNITY)
Admission: RE | Admit: 2020-04-18 | Discharge: 2020-04-18 | Disposition: A | Discharge status: Home / Self Care | Payer: Medicare HMO | Source: Ambulatory Visit | Attending: Orthopedic Surgery | Admitting: Orthopedic Surgery

## 2020-04-18 DIAGNOSIS — Z01812 Encounter for preprocedural laboratory examination: Secondary | ICD-10-CM | POA: Insufficient documentation

## 2020-04-18 DIAGNOSIS — Z20822 Contact with and (suspected) exposure to covid-19: Secondary | ICD-10-CM | POA: Insufficient documentation

## 2020-04-18 LAB — SARS CORONAVIRUS 2 (TAT 6-24 HRS): SARS Coronavirus 2: NEGATIVE

## 2020-04-22 ENCOUNTER — Encounter (HOSPITAL_COMMUNITY)
Admission: RE | Disposition: A | Discharge status: Home / Self Care | Payer: Self-pay | Source: Ambulatory Visit | Attending: Orthopedic Surgery

## 2020-04-22 ENCOUNTER — Observation Stay (HOSPITAL_COMMUNITY): Payer: Medicare HMO

## 2020-04-22 ENCOUNTER — Other Ambulatory Visit: Payer: Self-pay

## 2020-04-22 ENCOUNTER — Ambulatory Visit (HOSPITAL_COMMUNITY): Payer: Medicare HMO | Admitting: Certified Registered"

## 2020-04-22 ENCOUNTER — Encounter (HOSPITAL_COMMUNITY): Payer: Self-pay | Admitting: Orthopedic Surgery

## 2020-04-22 ENCOUNTER — Observation Stay (HOSPITAL_COMMUNITY)
Admission: RE | Admit: 2020-04-22 | Discharge: 2020-04-23 | Disposition: A | Discharge status: Home / Self Care | Payer: Medicare HMO | Source: Ambulatory Visit | Attending: Orthopedic Surgery | Admitting: Orthopedic Surgery

## 2020-04-22 DIAGNOSIS — M1611 Unilateral primary osteoarthritis, right hip: Secondary | ICD-10-CM | POA: Diagnosis present

## 2020-04-22 DIAGNOSIS — Z951 Presence of aortocoronary bypass graft: Secondary | ICD-10-CM | POA: Insufficient documentation

## 2020-04-22 DIAGNOSIS — Z96642 Presence of left artificial hip joint: Secondary | ICD-10-CM | POA: Insufficient documentation

## 2020-04-22 DIAGNOSIS — E871 Hypo-osmolality and hyponatremia: Secondary | ICD-10-CM | POA: Diagnosis not present

## 2020-04-22 DIAGNOSIS — I214 Non-ST elevation (NSTEMI) myocardial infarction: Secondary | ICD-10-CM | POA: Diagnosis not present

## 2020-04-22 DIAGNOSIS — Z20822 Contact with and (suspected) exposure to covid-19: Secondary | ICD-10-CM | POA: Diagnosis not present

## 2020-04-22 DIAGNOSIS — G92 Toxic encephalopathy: Secondary | ICD-10-CM | POA: Diagnosis not present

## 2020-04-22 DIAGNOSIS — Z96641 Presence of right artificial hip joint: Secondary | ICD-10-CM

## 2020-04-22 DIAGNOSIS — I2510 Atherosclerotic heart disease of native coronary artery without angina pectoris: Secondary | ICD-10-CM | POA: Insufficient documentation

## 2020-04-22 DIAGNOSIS — I739 Peripheral vascular disease, unspecified: Secondary | ICD-10-CM | POA: Diagnosis not present

## 2020-04-22 DIAGNOSIS — Z955 Presence of coronary angioplasty implant and graft: Secondary | ICD-10-CM | POA: Insufficient documentation

## 2020-04-22 DIAGNOSIS — Z471 Aftercare following joint replacement surgery: Secondary | ICD-10-CM | POA: Diagnosis not present

## 2020-04-22 DIAGNOSIS — I10 Essential (primary) hypertension: Secondary | ICD-10-CM | POA: Insufficient documentation

## 2020-04-22 DIAGNOSIS — I25810 Atherosclerosis of coronary artery bypass graft(s) without angina pectoris: Secondary | ICD-10-CM | POA: Diagnosis not present

## 2020-04-22 DIAGNOSIS — I97191 Other postprocedural cardiac functional disturbances following other surgery: Secondary | ICD-10-CM | POA: Diagnosis not present

## 2020-04-22 DIAGNOSIS — D62 Acute posthemorrhagic anemia: Secondary | ICD-10-CM | POA: Diagnosis not present

## 2020-04-22 DIAGNOSIS — R410 Disorientation, unspecified: Secondary | ICD-10-CM | POA: Diagnosis not present

## 2020-04-22 DIAGNOSIS — E785 Hyperlipidemia, unspecified: Secondary | ICD-10-CM | POA: Diagnosis not present

## 2020-04-22 HISTORY — PX: TOTAL HIP ARTHROPLASTY: SHX124

## 2020-04-22 SURGERY — ARTHROPLASTY, HIP, TOTAL,POSTERIOR APPROACH
Anesthesia: Spinal | Site: Hip | Laterality: Right

## 2020-04-22 MED ORDER — MIDAZOLAM HCL 2 MG/2ML IJ SOLN
INTRAMUSCULAR | Status: AC
  Filled 2020-04-22: qty 2

## 2020-04-22 MED ORDER — SPIRONOLACTONE 12.5 MG HALF TABLET
12.5000 mg | ORAL_TABLET | Freq: Every day | ORAL | Status: DC
  Administered 2020-04-22 – 2020-04-23 (×2): 12.5 mg via ORAL
  Filled 2020-04-22 (×2): qty 1

## 2020-04-22 MED ORDER — LOSARTAN POTASSIUM 50 MG PO TABS
100.0000 mg | ORAL_TABLET | Freq: Every day | ORAL | Status: DC
  Administered 2020-04-23: 100 mg via ORAL
  Filled 2020-04-22: qty 2

## 2020-04-22 MED ORDER — PROPOFOL 1000 MG/100ML IV EMUL
INTRAVENOUS | Status: AC
  Filled 2020-04-22: qty 100

## 2020-04-22 MED ORDER — PANTOPRAZOLE SODIUM 40 MG PO TBEC
40.0000 mg | DELAYED_RELEASE_TABLET | Freq: Every day | ORAL | Status: DC
  Administered 2020-04-23: 40 mg via ORAL
  Filled 2020-04-22: qty 1

## 2020-04-22 MED ORDER — ORAL CARE MOUTH RINSE: 15.0000 mL | Freq: Once | OROMUCOSAL | Status: AC

## 2020-04-22 MED ORDER — ATORVASTATIN CALCIUM 40 MG PO TABS
80.0000 mg | ORAL_TABLET | Freq: Every day | ORAL | Status: DC
  Administered 2020-04-23: 80 mg via ORAL
  Filled 2020-04-22: qty 2

## 2020-04-22 MED ORDER — FENTANYL CITRATE (PF) 100 MCG/2ML IJ SOLN
INTRAMUSCULAR | Status: AC
  Filled 2020-04-22: qty 2

## 2020-04-22 MED ORDER — 0.9 % SODIUM CHLORIDE (POUR BTL) OPTIME
TOPICAL | Status: DC | PRN
  Administered 2020-04-22: 1000 mL

## 2020-04-22 MED ORDER — ASPIRIN EC 325 MG PO TBEC
325.0000 mg | DELAYED_RELEASE_TABLET | Freq: Every day | ORAL | Status: DC
  Administered 2020-04-23: 325 mg via ORAL
  Filled 2020-04-22: qty 1

## 2020-04-22 MED ORDER — NITROGLYCERIN 0.4 MG SL SUBL: 0.4000 mg | SUBLINGUAL_TABLET | SUBLINGUAL | Status: DC | PRN

## 2020-04-22 MED ORDER — BUPIVACAINE HCL (PF) 0.25 % IJ SOLN
INTRAMUSCULAR | Status: DC | PRN
  Administered 2020-04-22: 30 mL

## 2020-04-22 MED ORDER — HYDROMORPHONE HCL 1 MG/ML IJ SOLN: 0.5000 mg | INTRAMUSCULAR | Status: DC | PRN

## 2020-04-22 MED ORDER — POLYETHYLENE GLYCOL 3350 17 G PO PACK: 17.0000 g | PACK | Freq: Every day | ORAL | Status: DC | PRN

## 2020-04-22 MED ORDER — MIDAZOLAM HCL 2 MG/2ML IJ SOLN
INTRAMUSCULAR | Status: DC | PRN
  Administered 2020-04-22: 2 mg via INTRAVENOUS

## 2020-04-22 MED ORDER — ONDANSETRON HCL 4 MG/2ML IJ SOLN
INTRAMUSCULAR | Status: AC
  Filled 2020-04-22: qty 2

## 2020-04-22 MED ORDER — CEFAZOLIN SODIUM-DEXTROSE 2-4 GM/100ML-% IV SOLN
2.0000 g | Freq: Four times a day (QID) | INTRAVENOUS | Status: AC
  Administered 2020-04-22 (×2): 2 g via INTRAVENOUS
  Filled 2020-04-22 (×2): qty 100

## 2020-04-22 MED ORDER — ACETAMINOPHEN 500 MG PO TABS
1000.0000 mg | ORAL_TABLET | Freq: Once | ORAL | Status: AC
  Administered 2020-04-22: 1000 mg via ORAL
  Filled 2020-04-22: qty 2

## 2020-04-22 MED ORDER — POTASSIUM CHLORIDE IN NACL 20-0.45 MEQ/L-% IV SOLN
INTRAVENOUS | Status: DC
  Filled 2020-04-22 (×3): qty 1000

## 2020-04-22 MED ORDER — AMLODIPINE BESYLATE 10 MG PO TABS
10.0000 mg | ORAL_TABLET | Freq: Every day | ORAL | Status: DC
  Administered 2020-04-23: 10 mg via ORAL
  Filled 2020-04-22: qty 1

## 2020-04-22 MED ORDER — MEPERIDINE HCL 50 MG/ML IJ SOLN: 6.2500 mg | INTRAMUSCULAR | Status: DC | PRN

## 2020-04-22 MED ORDER — PHENOL 1.4 % MT LIQD: 1.0000 | OROMUCOSAL | Status: DC | PRN

## 2020-04-22 MED ORDER — PHENYLEPHRINE HCL (PRESSORS) 10 MG/ML IV SOLN
INTRAVENOUS | Status: AC
  Filled 2020-04-22: qty 1

## 2020-04-22 MED ORDER — STERILE WATER FOR IRRIGATION IR SOLN
Status: DC | PRN
  Administered 2020-04-22: 2000 mL

## 2020-04-22 MED ORDER — MENTHOL 3 MG MT LOZG: 1.0000 | LOZENGE | OROMUCOSAL | Status: DC | PRN

## 2020-04-22 MED ORDER — DOCUSATE SODIUM 100 MG PO CAPS
100.0000 mg | ORAL_CAPSULE | Freq: Two times a day (BID) | ORAL | Status: DC
  Administered 2020-04-22 – 2020-04-23 (×2): 100 mg via ORAL
  Filled 2020-04-22 (×2): qty 1

## 2020-04-22 MED ORDER — PHENYLEPHRINE HCL-NACL 10-0.9 MG/250ML-% IV SOLN
INTRAVENOUS | Status: DC | PRN
Start: 2020-04-22 — End: 2020-04-22
  Administered 2020-04-22: 25 ug/min via INTRAVENOUS

## 2020-04-22 MED ORDER — DEXAMETHASONE SODIUM PHOSPHATE 10 MG/ML IJ SOLN
10.0000 mg | Freq: Once | INTRAMUSCULAR | Status: AC
  Administered 2020-04-23: 10 mg via INTRAVENOUS
  Filled 2020-04-22: qty 1

## 2020-04-22 MED ORDER — OXYCODONE HCL 5 MG PO TABS: 10.0000 mg | ORAL_TABLET | ORAL | Status: DC | PRN

## 2020-04-22 MED ORDER — LACTATED RINGERS IV SOLN: INTRAVENOUS | Status: DC

## 2020-04-22 MED ORDER — EPHEDRINE SULFATE-NACL 50-0.9 MG/10ML-% IV SOSY
PREFILLED_SYRINGE | INTRAVENOUS | Status: DC | PRN
  Administered 2020-04-22 (×3): 10 mg via INTRAVENOUS

## 2020-04-22 MED ORDER — HYDROMORPHONE HCL 1 MG/ML IJ SOLN
0.5000 mg | INTRAMUSCULAR | Status: AC | PRN
  Administered 2020-04-22: 0.5 mg via INTRAVENOUS

## 2020-04-22 MED ORDER — OXYCODONE HCL 5 MG PO TABS
ORAL_TABLET | ORAL | Status: AC
  Administered 2020-04-22: 5 mg via ORAL
  Filled 2020-04-22: qty 1

## 2020-04-22 MED ORDER — EZETIMIBE 10 MG PO TABS
10.0000 mg | ORAL_TABLET | Freq: Every day | ORAL | Status: DC
  Administered 2020-04-22 – 2020-04-23 (×2): 10 mg via ORAL
  Filled 2020-04-22 (×2): qty 1

## 2020-04-22 MED ORDER — FENTANYL CITRATE (PF) 100 MCG/2ML IJ SOLN
25.0000 ug | INTRAMUSCULAR | Status: DC | PRN
  Administered 2020-04-22 (×2): 50 ug via INTRAVENOUS

## 2020-04-22 MED ORDER — ALUM & MAG HYDROXIDE-SIMETH 200-200-20 MG/5ML PO SUSP: 30.0000 mL | ORAL | Status: DC | PRN

## 2020-04-22 MED ORDER — PROPOFOL 10 MG/ML IV BOLUS
INTRAVENOUS | Status: AC
  Filled 2020-04-22: qty 20

## 2020-04-22 MED ORDER — PROPOFOL 500 MG/50ML IV EMUL
INTRAVENOUS | Status: AC
  Filled 2020-04-22: qty 50

## 2020-04-22 MED ORDER — ONDANSETRON HCL 4 MG/2ML IJ SOLN: 4.0000 mg | Freq: Four times a day (QID) | INTRAMUSCULAR | Status: DC | PRN

## 2020-04-22 MED ORDER — EPHEDRINE 5 MG/ML INJ
INTRAVENOUS | Status: AC
  Filled 2020-04-22: qty 10

## 2020-04-22 MED ORDER — PHENYLEPHRINE 40 MCG/ML (10ML) SYRINGE FOR IV PUSH (FOR BLOOD PRESSURE SUPPORT)
PREFILLED_SYRINGE | INTRAVENOUS | Status: DC | PRN
  Administered 2020-04-22: 80 ug via INTRAVENOUS

## 2020-04-22 MED ORDER — KETOROLAC TROMETHAMINE 15 MG/ML IJ SOLN
7.5000 mg | Freq: Four times a day (QID) | INTRAMUSCULAR | Status: AC
  Administered 2020-04-22 – 2020-04-23 (×4): 7.5 mg via INTRAVENOUS
  Filled 2020-04-22 (×4): qty 1

## 2020-04-22 MED ORDER — CHLORHEXIDINE GLUCONATE 0.12 % MT SOLN
15.0000 mL | Freq: Once | OROMUCOSAL | Status: AC
  Administered 2020-04-22: 15 mL via OROMUCOSAL

## 2020-04-22 MED ORDER — DEXAMETHASONE SODIUM PHOSPHATE 10 MG/ML IJ SOLN
INTRAMUSCULAR | Status: DC | PRN
  Administered 2020-04-22: 8 mg via INTRAVENOUS

## 2020-04-22 MED ORDER — DIPHENHYDRAMINE HCL 12.5 MG/5ML PO ELIX: 12.5000 mg | ORAL_SOLUTION | ORAL | Status: DC | PRN

## 2020-04-22 MED ORDER — TAMSULOSIN HCL 0.4 MG PO CAPS
0.8000 mg | ORAL_CAPSULE | Freq: Every day | ORAL | Status: DC
  Administered 2020-04-22: 0.8 mg via ORAL
  Filled 2020-04-22: qty 2

## 2020-04-22 MED ORDER — PROPOFOL 500 MG/50ML IV EMUL
INTRAVENOUS | Status: DC | PRN
  Administered 2020-04-22: 110 ug/kg/min via INTRAVENOUS

## 2020-04-22 MED ORDER — ONDANSETRON HCL 4 MG/2ML IJ SOLN: 4.0000 mg | Freq: Once | INTRAMUSCULAR | Status: DC | PRN

## 2020-04-22 MED ORDER — ACETAMINOPHEN 500 MG PO TABS
1000.0000 mg | ORAL_TABLET | Freq: Four times a day (QID) | ORAL | Status: AC
  Administered 2020-04-22 – 2020-04-23 (×4): 1000 mg via ORAL
  Filled 2020-04-22 (×4): qty 2

## 2020-04-22 MED ORDER — HYDROMORPHONE HCL 1 MG/ML IJ SOLN
INTRAMUSCULAR | Status: AC
  Administered 2020-04-22: 0.5 mg via INTRAVENOUS
  Filled 2020-04-22: qty 1

## 2020-04-22 MED ORDER — DEXAMETHASONE SODIUM PHOSPHATE 10 MG/ML IJ SOLN
INTRAMUSCULAR | Status: AC
  Filled 2020-04-22: qty 1

## 2020-04-22 MED ORDER — METOCLOPRAMIDE HCL 5 MG/ML IJ SOLN: 5.0000 mg | Freq: Three times a day (TID) | INTRAMUSCULAR | Status: DC | PRN

## 2020-04-22 MED ORDER — BUPIVACAINE HCL (PF) 0.25 % IJ SOLN
INTRAMUSCULAR | Status: AC
  Filled 2020-04-22: qty 30

## 2020-04-22 MED ORDER — METOCLOPRAMIDE HCL 5 MG PO TABS: 5.0000 mg | ORAL_TABLET | Freq: Three times a day (TID) | ORAL | Status: DC | PRN

## 2020-04-22 MED ORDER — BISACODYL 10 MG RE SUPP: 10.0000 mg | Freq: Every day | RECTAL | Status: DC | PRN

## 2020-04-22 MED ORDER — KETOROLAC TROMETHAMINE 30 MG/ML IJ SOLN
INTRAMUSCULAR | Status: DC | PRN
  Administered 2020-04-22: 30 mg

## 2020-04-22 MED ORDER — FENTANYL CITRATE (PF) 100 MCG/2ML IJ SOLN
INTRAMUSCULAR | Status: AC
  Administered 2020-04-22: 50 ug via INTRAVENOUS
  Filled 2020-04-22: qty 2

## 2020-04-22 MED ORDER — KETOROLAC TROMETHAMINE 30 MG/ML IJ SOLN
INTRAMUSCULAR | Status: AC
  Filled 2020-04-22: qty 1

## 2020-04-22 MED ORDER — CEFAZOLIN SODIUM-DEXTROSE 2-4 GM/100ML-% IV SOLN
2.0000 g | INTRAVENOUS | Status: AC
  Administered 2020-04-22: 2 g via INTRAVENOUS
  Filled 2020-04-22: qty 100

## 2020-04-22 MED ORDER — OXYCODONE HCL 5 MG PO TABS
5.0000 mg | ORAL_TABLET | ORAL | Status: DC | PRN
  Administered 2020-04-22: 10 mg via ORAL
  Administered 2020-04-22: 5 mg via ORAL
  Administered 2020-04-23 (×2): 10 mg via ORAL
  Filled 2020-04-22 (×2): qty 2
  Filled 2020-04-22: qty 1
  Filled 2020-04-22: qty 2

## 2020-04-22 MED ORDER — BUPIVACAINE IN DEXTROSE 0.75-8.25 % IT SOLN
INTRATHECAL | Status: DC | PRN
  Administered 2020-04-22: 1.6 mL via INTRATHECAL

## 2020-04-22 MED ORDER — PROPOFOL 10 MG/ML IV BOLUS
INTRAVENOUS | Status: DC | PRN
  Administered 2020-04-22: 20 mg via INTRAVENOUS
  Administered 2020-04-22: 10 mg via INTRAVENOUS
  Administered 2020-04-22: 20 mg via INTRAVENOUS

## 2020-04-22 MED ORDER — POVIDONE-IODINE 10 % EX SWAB
2.0000 "application " | Freq: Once | CUTANEOUS | Status: AC
  Administered 2020-04-22: 2 via TOPICAL

## 2020-04-22 MED ORDER — OMEGA-3-ACID ETHYL ESTERS 1 G PO CAPS
2.0000 g | ORAL_CAPSULE | Freq: Every day | ORAL | Status: DC
  Administered 2020-04-22 – 2020-04-23 (×2): 2 g via ORAL
  Filled 2020-04-22 (×2): qty 2

## 2020-04-22 MED ORDER — MAGNESIUM CITRATE PO SOLN: 1.0000 | Freq: Once | ORAL | Status: DC | PRN

## 2020-04-22 MED ORDER — TRANEXAMIC ACID-NACL 1000-0.7 MG/100ML-% IV SOLN
1000.0000 mg | INTRAVENOUS | Status: AC
  Administered 2020-04-22: 1000 mg via INTRAVENOUS
  Filled 2020-04-22: qty 100

## 2020-04-22 MED ORDER — ACETAMINOPHEN 325 MG PO TABS: 325.0000 mg | ORAL_TABLET | Freq: Four times a day (QID) | ORAL | Status: DC | PRN

## 2020-04-22 MED ORDER — ZOLPIDEM TARTRATE 5 MG PO TABS: 5.0000 mg | ORAL_TABLET | Freq: Every evening | ORAL | Status: DC | PRN

## 2020-04-22 MED ORDER — ONDANSETRON HCL 4 MG PO TABS: 4.0000 mg | ORAL_TABLET | Freq: Four times a day (QID) | ORAL | Status: DC | PRN

## 2020-04-22 MED ORDER — ONDANSETRON HCL 4 MG/2ML IJ SOLN
INTRAMUSCULAR | Status: DC | PRN
  Administered 2020-04-22: 4 mg via INTRAVENOUS

## 2020-04-22 SURGICAL SUPPLY — 61 items
ACETAB CUP W/GRIPTION 54 (Plate) ×2 IMPLANT
BIT DRILL 2.0X128 (BIT) ×2 IMPLANT
BLADE SAW SAG 73X25 THK (BLADE) ×1
BLADE SAW SGTL 73X25 THK (BLADE) ×1 IMPLANT
CLSR STERI-STRIP ANTIMIC 1/2X4 (GAUZE/BANDAGES/DRESSINGS) ×4 IMPLANT
COVER SURGICAL LIGHT HANDLE (MISCELLANEOUS) ×2 IMPLANT
COVER WAND RF STERILE (DRAPES) IMPLANT
CUP ACETAB W/GRIPTION 54 (Plate) IMPLANT
DRAPE INCISE IOBAN 66X45 STRL (DRAPES) ×2 IMPLANT
DRAPE ORTHO SPLIT 77X108 STRL (DRAPES) ×4
DRAPE POUCH INSTRU U-SHP 10X18 (DRAPES) ×2 IMPLANT
DRAPE SHEET LG 3/4 BI-LAMINATE (DRAPES) ×2 IMPLANT
DRAPE SURG 17X11 SM STRL (DRAPES) ×2 IMPLANT
DRAPE SURG ORHT 6 SPLT 77X108 (DRAPES) ×2 IMPLANT
DRAPE U-SHAPE 47X51 STRL (DRAPES) ×2 IMPLANT
DRSG MEPILEX BORDER 4X12 (GAUZE/BANDAGES/DRESSINGS) IMPLANT
DRSG MEPILEX BORDER 4X8 (GAUZE/BANDAGES/DRESSINGS) ×2 IMPLANT
DURAPREP 26ML APPLICATOR (WOUND CARE) ×4 IMPLANT
ELECT BLADE TIP CTD 4 INCH (ELECTRODE) ×2 IMPLANT
ELECT REM PT RETURN 15FT ADLT (MISCELLANEOUS) ×2 IMPLANT
ELIMINATOR HOLE APEX DEPUY (Hips) ×1 IMPLANT
FACESHIELD WRAPAROUND (MASK) ×2 IMPLANT
FACESHIELD WRAPAROUND OR TEAM (MASK) ×1 IMPLANT
GLOVE BIO SURGEON STRL SZ7 (GLOVE) ×2 IMPLANT
GLOVE BIO SURGEON STRL SZ7.5 (GLOVE) ×2 IMPLANT
GLOVE BIOGEL PI IND STRL 7.0 (GLOVE) ×1 IMPLANT
GLOVE BIOGEL PI IND STRL 8 (GLOVE) ×1 IMPLANT
GLOVE BIOGEL PI INDICATOR 7.0 (GLOVE) ×1
GLOVE BIOGEL PI INDICATOR 8 (GLOVE) ×1
GOWN STRL REUS W/TWL LRG LVL3 (GOWN DISPOSABLE) ×4 IMPLANT
HEAD M SROM 36MM PLUS 1.5 (Hips) IMPLANT
HOOD PEEL AWAY FLYTE STAYCOOL (MISCELLANEOUS) ×6 IMPLANT
KIT BASIN OR (CUSTOM PROCEDURE TRAY) ×2 IMPLANT
KIT TURNOVER KIT A (KITS) ×2 IMPLANT
LINER NEUTRAL 54X36MM PLUS 4 (Hips) ×1 IMPLANT
MANIFOLD NEPTUNE II (INSTRUMENTS) ×2 IMPLANT
NDL SAFETY ECLIPSE 18X1.5 (NEEDLE) ×2 IMPLANT
NEEDLE HYPO 18GX1.5 SHARP (NEEDLE) ×4
NS IRRIG 1000ML POUR BTL (IV SOLUTION) ×2 IMPLANT
PACK TOTAL JOINT (CUSTOM PROCEDURE TRAY) ×2 IMPLANT
PENCIL SMOKE EVACUATOR (MISCELLANEOUS) IMPLANT
PROTECTOR NERVE ULNAR (MISCELLANEOUS) ×2 IMPLANT
RETRIEVER SUT HEWSON (MISCELLANEOUS) ×2 IMPLANT
SCREW 6.5MMX25MM (Screw) ×1 IMPLANT
SROM M HEAD 36MM PLUS 1.5 (Hips) ×2 IMPLANT
STEM SUMMIT STD 4 (Stem) ×1 IMPLANT
STRIP CLOSURE SKIN 1/2X4 (GAUZE/BANDAGES/DRESSINGS) ×1 IMPLANT
SUCTION FRAZIER HANDLE 12FR (TUBING) ×2
SUCTION TUBE FRAZIER 12FR DISP (TUBING) ×1 IMPLANT
SUT FIBERWIRE #2 38 REV NDL BL (SUTURE) ×6
SUT VIC AB 0 CT1 36 (SUTURE) ×2 IMPLANT
SUT VIC AB 1 CT1 36 (SUTURE) ×4 IMPLANT
SUT VIC AB 2-0 CT1 27 (SUTURE) ×4
SUT VIC AB 2-0 CT1 TAPERPNT 27 (SUTURE) ×2 IMPLANT
SUT VIC AB 3-0 SH 8-18 (SUTURE) ×2 IMPLANT
SUTURE FIBERWR#2 38 REV NDL BL (SUTURE) ×3 IMPLANT
SYR CONTROL 10ML LL (SYRINGE) ×4 IMPLANT
TOWEL OR 17X26 10 PK STRL BLUE (TOWEL DISPOSABLE) ×2 IMPLANT
TRAY FOLEY MTR SLVR 16FR STAT (SET/KITS/TRAYS/PACK) ×2 IMPLANT
WATER STERILE IRR 1000ML POUR (IV SOLUTION) ×4 IMPLANT
YANKAUER SUCT BULB TIP 10FT TU (MISCELLANEOUS) ×2 IMPLANT

## 2020-04-22 NOTE — Op Note (Signed)
04/22/2020  12:20 PM  PATIENT:  [name]   MRN: [id number]  PRE-OPERATIVE DIAGNOSIS: Right hip primary localized osteoarthritis  POST-OPERATIVE DIAGNOSIS:  same  PROCEDURE:  Procedure(s): RIGHT TOTAL HIP ARTHROPLASTY  PREOPERATIVE INDICATIONS:    [name] is an 79 y.o. male who has a diagnosis of right hip primary localized osteoarthritis and elected for surgical management after failing conservative treatment.  The risks benefits and alternatives were discussed with the patient including but not limited to the risks of nonoperative treatment, versus surgical intervention including infection, bleeding, nerve injury, periprosthetic fracture, the need for revision surgery, dislocation, leg length discrepancy, blood clots, cardiopulmonary complications, morbidity, mortality, among others, and they were willing to proceed.     OPERATIVE REPORT     SURGEON:  Marchia Bond, MD    ASSISTANT:  Merlene Pulling, PA-C, (Present throughout the entire procedure,  necessary for completion of procedure in a timely manner, assisting with retraction, instrumentation, and closure)     ANESTHESIA: Spinal with a Foley catheter  ESTIMATED BLOOD LOSS: 366 mL    COMPLICATIONS:  None.     UNIQUE ASPECTS OF THE CASE: I felt like I had positioned the cup matching his anatomy, not overly anteverted, but appropriate.  He had excellent stability at the completion of the case.  The size 4 femur had 1 tooth showing on the broach.  Leg lengths felt equal at the completion of the case.  I had a little bit of a rim fit on the cup, and consider whether to ream up to a 56, but I felt I had good cancellous bone with the 53 reamer.  COMPONENTS:  [employer] fit femur size 4 with a 36 mm + 1.5 metallic head ball and a Gription Acetabular shell size 54, with a single cancellous screw for backup fixation, with an apex hole eliminator and a +4 neutral polyethylene liner.    PROCEDURE IN DETAIL:   The  patient was met in the holding area and  identified.  The appropriate hip was identified and marked at the operative site.  The patient was then transported to the OR  and  placed under spinal anesthesia.  At that point, the patient was  placed in the lateral decubitus position with the operative side up and  secured to the operating room table and all bony prominences padded.     The operative lower extremity was prepped from the iliac crest to the distal leg.  Sterile draping was performed.  Time out was performed prior to incision.      A routine posterolateral approach was utilized via sharp dissection  carried down to the subcutaneous tissue.  Gross bleeders were Bovie coagulated.  The iliotibial band was identified and incised along the length of the skin incision.  Self-retaining retractors were  inserted.  With the hip internally rotated, the short external rotators  were identified. The piriformis and capsule was tagged with FiberWire, and the hip capsule released in a T-type fashion.  The femoral neck was exposed, and I resected the femoral neck using the appropriate jig. This was performed at approximately a thumb's breadth above the lesser trochanter.    I then exposed the deep acetabulum, cleared out any tissue including the ligamentum teres.  A wing retractor was placed.  After adequate visualization, I excised the labrum, and then sequentially reamed.  I placed the trial acetabulum, which seated nicely, and then impacted the real cup into place.  Appropriate version and  inclination was confirmed clinically matching their bony anatomy, and also with the use of the jig.  I placed a cancellous screw to augment fixation.  A trial polyethylene liner was placed and the wing retractor removed.    I then prepared the proximal femur using the cookie-cutter, the lateralizing reamer, and then sequentially reamed and broached.  A trial broach, neck, and head was utilized, and I reduced the hip and  it was found to have excellent stability with functional range of motion. The trial components were then removed, and the real polyethylene liner was placed.  I then impacted the real femoral prosthesis into place into the appropriate version, slightly anteverted to the normal anatomy, and I impacted the real head ball into place. The hip was then reduced and taken through functional range of motion and found to have excellent stability. Leg lengths were restored.  I then used a 2 mm drill bits to pass the FiberWire suture from the capsule and piriformis through the greater trochanter, and secured this. Excellent posterior capsular repair was achieved. I also closed the T in the capsule.  I then irrigated the hip copiously again with pulse lavage, and repaired the fascia with Vicryl, followed by Vicryl for the subcutaneous tissue, Monocryl for the skin, Steri-Strips and sterile gauze. The wounds were injected. The patient was then awakened and returned to PACU in stable and satisfactory condition. There were no complications.  Marchia Bond, MD Orthopedic Surgeon [phone]   04/22/2020 12:20 PM

## 2020-04-22 NOTE — Transfer of Care (Signed)
Immediate Anesthesia Transfer of Care Note  Patient: [name]  Procedure(s) Performed: TOTAL HIP ARTHROPLASTY (Right Hip)  Patient Location: PACU  Anesthesia Type:Spinal  Level of Consciousness: awake  Airway & Oxygen Therapy: Patient Spontanous Breathing and Patient connected to face mask oxygen  Post-op Assessment: Report given to RN and Post -op Vital signs reviewed and stable  Post vital signs: Reviewed and stable  Last Vitals:  Vitals Value Taken Time  BP 165/56 04/22/20 1254  Temp    Pulse 66 04/22/20 1256  Resp 26 04/22/20 1256  SpO2 100 % 04/22/20 1256  Vitals shown include unvalidated device data.  Last Pain:  Vitals:   04/22/20 0844  TempSrc: Oral         Complications: No complications documented.

## 2020-04-22 NOTE — Evaluation (Signed)
Physical Therapy Evaluation Patient Details Name: [name] MRN: [id number] DOB: 10/28/1940 Today's Date: 04/22/2020   History of Present Illness  Patient is 79 y.o. male s/p Rt THA via posterolateral approach on 04/22/20 with PMH significant for HLD, HTN, CAD, CABGx4 in 2012.    Clinical Impression  [name] is a 79 y.o. male POD 0 s/p Rt THA with posterior hip precautions. Patient reports independence with mobility at baseline. Patient is now limited by functional impairments (see PT problem list below) and requires min assist for transfers and gait with RW. Patient was able to ambulate ~8 feet with RW and min assist. Patient limited by pain and then lightheadedness/overheated sensation and seated rest provided. Symptoms resolved and BP stable. Patient instructed in exercise to facilitate circulation. Patient will benefit from continued skilled PT interventions to address impairments and progress towards PLOF. Acute PT will follow to progress mobility and stair training in preparation for safe discharge home.     Follow Up Recommendations Follow surgeon's recommendation for DC plan and follow-up therapies;Outpatient PT    Equipment Recommendations  None recommended by PT    Recommendations for Other Services       Precautions / Restrictions Precautions Precautions: Fall;Posterior Hip Precaution Booklet Issued: Yes (comment) Restrictions Weight Bearing Restrictions: No RLE Weight Bearing: Weight bearing as tolerated      Mobility  Bed Mobility Overal bed mobility: Needs Assistance Bed Mobility: Supine to Sit     Supine to sit: Min assist;HOB elevated     General bed mobility comments: VC's for sequencing and use of bed rail, assist to mobilize Rt LE and maintain posterior hip precautions.  Transfers Overall transfer level: Needs assistance Equipment used: Rolling walker (2 wheeled) Transfers: Sit to/from Stand Sit to Stand: Min assist;From elevated surface          General transfer comment: cues for safe technique with RW and assist to steady with power up/rise.   Ambulation/Gait Ambulation/Gait assistance: Min assist Gait Distance (Feet): 8 Feet Assistive device: Rolling walker (2 wheeled) Gait Pattern/deviations: Step-to pattern Gait velocity: decr   General Gait Details: cues for safe step pattern and proximity to RW throughout. no overt LOB noted during gait. pt takign very short steps. pt c/o dizziness and overheated sensation after ~8' and seated rest provided.   Stairs            Wheelchair Mobility    Modified Rankin (Stroke Patients Only)       Balance Overall balance assessment: Needs assistance Sitting-balance support: Feet supported       Standing balance support: During functional activity;Bilateral upper extremity supported Standing balance-Leahy Scale: Poor              Pertinent Vitals/Pain Pain Assessment: 0-10 Pain Score: 6  Pain Location: Rt hip Pain Descriptors / Indicators: Aching;Discomfort Pain Intervention(s): Limited activity within patient's tolerance;Monitored during session;Repositioned;Ice applied    Home Living Family/patient expects to be discharged to:: Private residence Living Arrangements: Spouse/significant other Available Help at Discharge: Family Type of Home: House Home Access: Stairs to enter Entrance Stairs-Rails: Lawyer of Steps: 5 Home Layout: One level (1 step in home from to [employer]) Home Equipment: Dan Humphreys - 2 wheels;Cane - single point;Grab bars - tub/shower;Shower seat;Shower seat - built in      Prior Function Level of Independence: Independent               Hand Dominance   Dominant Hand: Right    Extremity/Trunk Assessment  Upper Extremity Assessment Upper Extremity Assessment: Overall WFL for tasks assessed    Lower Extremity Assessment Lower Extremity Assessment: RLE deficits/detail RLE Deficits / Details: limited due to  preutions RLE: Unable to fully assess due to pain RLE Sensation: WNL RLE Coordination: WNL    Cervical / Trunk Assessment Cervical / Trunk Assessment: Normal  Communication   Communication: No difficulties  Cognition Arousal/Alertness: Awake/alert Behavior During Therapy: WFL for tasks assessed/performed Overall Cognitive Status: Within Functional Limits for tasks assessed                 General Comments      Exercises Total Joint Exercises Ankle Circles/Pumps: AROM;Both;20 reps;Seated   Assessment/Plan    PT Assessment Patient needs continued PT services  PT Problem List Decreased strength;Decreased range of motion;Decreased activity tolerance;Decreased balance;Decreased mobility;Decreased knowledge of use of DME;Pain;Decreased knowledge of precautions       PT Treatment Interventions DME instruction;Gait training;Stair training;Functional mobility training;Therapeutic activities;Balance training;Therapeutic exercise;Patient/family education    PT Goals (Current goals can be found in the Care Plan section)  Acute Rehab PT Goals Patient Stated Goal: to get back to independence with golfing, home decor/renovations PT Goal Formulation: With patient Time For Goal Achievement: 04/29/20 Potential to Achieve Goals: Good    Frequency 7X/week   Barriers to discharge        AM-PAC PT "6 Clicks" Mobility  Outcome Measure Help needed turning from your back to your side while in a flat bed without using bedrails?: A Little Help needed moving from lying on your back to sitting on the side of a flat bed without using bedrails?: A Little Help needed moving to and from a bed to a chair (including a wheelchair)?: A Little Help needed standing up from a chair using your arms (e.g., wheelchair or bedside chair)?: A Little Help needed to walk in hospital room?: A Little Help needed climbing 3-5 steps with a railing? : A Lot 6 Click Score: 17    End of Session Equipment  Utilized During Treatment: Gait belt Activity Tolerance: Treatment limited secondary to medical complications (Comment);Patient tolerated treatment well (gait limited by dizziness) Patient left: in chair;with call bell/phone within reach;with chair alarm set;with nursing/sitter in room Nurse Communication: Mobility status PT Visit Diagnosis: Muscle weakness (generalized) (M62.81);Difficulty in walking, not elsewhere classified (R26.2)    Time: 8546-2703 PT Time Calculation (min) (ACUTE ONLY): 36 min   Charges:   PT Evaluation $PT Eval Low Complexity: 1 Low PT Treatments $Gait Training: 8-22 mins       Wynn Maudlin, DPT Acute Rehabilitation Services  Office [phone] Pager [phone]  04/22/2020 5:38 PM

## 2020-04-22 NOTE — Anesthesia Procedure Notes (Signed)
Procedure Name: MAC Date/Time: 04/22/2020 10:33 AM Performed by: Niel Hummer, CRNA Pre-anesthesia Checklist: Patient identified, Emergency Drugs available, Suction available and Patient being monitored Oxygen Delivery Method: Simple face mask

## 2020-04-22 NOTE — Anesthesia Procedure Notes (Signed)
Spinal  Patient location during procedure: OR Start time: 04/22/2020 10:35 AM End time: 04/22/2020 10:39 AM Staffing Performed: anesthesiologist  Anesthesiologist: Mal Amabile, MD Preanesthetic Checklist Completed: patient identified, IV checked, site marked, risks and benefits discussed, surgical consent, monitors and equipment checked, pre-op evaluation and timeout performed Spinal Block Patient position: sitting Prep: DuraPrep and site prepped and draped Patient monitoring: heart rate, cardiac monitor, continuous pulse ox and blood pressure Approach: midline Location: L3-4 Injection technique: single-shot Needle Needle type: Pencan  Needle gauge: 24 G Needle length: 9 cm Needle insertion depth: 6 cm Assessment Sensory level: T4 Additional Notes Patient tolerated procedure well. Adequate sensory level. Somewhat difficult due to poor positioning. Attempt x 2.

## 2020-04-22 NOTE — Anesthesia Preprocedure Evaluation (Signed)
Anesthesia Evaluation  Patient identified by MRN, date of birth, ID band Patient awake    Reviewed: Allergy & Precautions, NPO status , Patient's Chart, lab work & pertinent test results, reviewed documented beta blocker date and time   Airway Mallampati: II  TM Distance: >3 FB Neck ROM: Full    Dental  (+) Missing, Poor Dentition, Chipped,    Pulmonary neg pulmonary ROS,    Pulmonary exam normal breath sounds clear to auscultation       Cardiovascular hypertension, Pt. on medications + CAD, + Cardiac Stents and + CABG  Normal cardiovascular exam Rhythm:Regular Rate:Normal  CAD S/P multiple stents RCA CABG X 4 10/12  Cardiac Cath 10/20 . 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 LAD 100% 3. RCA 100% 4. LCX 50% in mid LCX 5. LIMA to LAD widely patent 6. SVG to R PDA widely patent 7. SVG to OM occluded ostially 8. SVG to PL occluded ostially  He has lost 2 grafts but revascularization relatively complete. 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  EKG  NSR, normal   Neuro/Psych negative neurological ROS  negative psych ROS   GI/Hepatic Neg liver ROS, GERD  Medicated and Controlled,  Endo/Other  Hyperlipidemia  Renal/GU negative Renal ROS  negative genitourinary   Musculoskeletal  (+) Arthritis , Osteoarthritis,  OA right hip   Abdominal   Peds  Hematology negative hematology ROS (+)   Anesthesia Other Findings   Reproductive/Obstetrics                             Anesthesia Physical Anesthesia Plan  ASA: III  Anesthesia Plan: Spinal   Post-op Pain Management:    Induction: Intravenous  PONV Risk Score and Plan: 1 and Ondansetron and Treatment may vary due to age or medical condition  Airway Management Planned: Natural Airway and Simple Face Mask  Additional Equipment:   Intra-op Plan:   Post-operative Plan: Extubation  in OR  Informed Consent: I have reviewed the patients History and Physical, chart, labs and discussed the procedure including the risks, benefits and alternatives for the proposed anesthesia with the patient or authorized representative who has indicated his/her understanding and acceptance.     Dental advisory given  Plan Discussed with: CRNA, Anesthesiologist and Surgeon  Anesthesia Plan Comments:         Anesthesia Quick Evaluation

## 2020-04-22 NOTE — H&P (Signed)
PREOPERATIVE H&P  Chief Complaint: Right hip pain  HPI: [name] is a 79 y.o. male who presents for preoperative history and physical with a diagnosis of right hip osteoarthritis. Symptoms are rated as moderate to severe, and have been worsening.  This is significantly impairing activities of daily living.  He has elected for surgical management.   He has failed injections, activity modification, anti-inflammatories, and assistive devices.  Preoperative X-rays demonstrate end stage degenerative changes with osteophyte formation, loss of joint space, subchondral sclerosis.  He is noted significant loss of function, requires his wife's help to put on socks every day, and also has a hernia surgery that he is planning to do in the next 6 to 8 weeks.  Past Medical History:  Diagnosis Date  . CAD (coronary artery disease)    s/p multiple PCIs to RCA dating back to 92. Last stent to proximal RCA in November of 2011. Also had 99% stenosis in the distal PDA with left to right collaterals and nonobstructive disease in the left coronary tree. ;  ISR of RCA 10/12 - CABG 10/9 with Dr. Zenaida Niece Trigt: L-LAD, S-OM, S-PDA  . 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 (stable)  . HTN (hypertension)   . Hyperlipidemia    Past Surgical History:  Procedure Laterality Date  . CARDIAC CATHETERIZATION    . Coronary artery bypass grafting x4 (left internal mammary artery  06/23/2011  . CORONARY STENT PLACEMENT  Nov 2011   prox RCA. Has had prior multiple procedures.  Rosalie Doctor ANGIOGRAPHY N/A 05/04/2019   Procedure: CORONARY/GRAFT ANGIOGRAPHY;  Surgeon: Dolores Patty, MD;  Location: [facility];  Service: Cardiovascular;  Laterality: N/A;  . JOINT REPLACEMENT    . left total hip replacement  2008  . TONSILLECTOMY    . TOOTH EXTRACTION  06/2013   Social History   Socioeconomic History  . Marital status: Married    Spouse name: Not on file  . Number of children: Not on  file  . Years of education: Not on file  . Highest education level: Not on file  Occupational History  . Not on file  Tobacco Use  . Smoking status: Never Smoker  . Smokeless tobacco: Never Used  Vaping Use  . Vaping Use: Never used  Substance and Sexual Activity  . Alcohol use: Yes    Alcohol/week: 6.0 standard drinks    Types: 6 Cans of beer per week    Comment: weekly  . Drug use: No  . Sexual activity: Not on file  Other Topics Concern  . Not on file  Social History Narrative  . Not on file   Social Determinants of Health   Financial Resource Strain:   . Difficulty of Paying Living Expenses:   Food Insecurity:   . Worried About Programme researcher, broadcasting/film/video in the Last Year:   . Barista in the Last Year:   Transportation Needs:   . Freight forwarder (Medical):   Marland Kitchen Lack of Transportation (Non-Medical):   Physical Activity:   . Days of Exercise per Week:   . Minutes of Exercise per Session:   Stress:   . Feeling of Stress :   Social Connections:   . Frequency of Communication with Friends and Family:   . Frequency of Social Gatherings with Friends and Family:   . Attends Religious Services:   . Active Member of Clubs or Organizations:   . Attends Banker Meetings:   .  Marital Status:    Family History  Problem Relation Age of Onset  . Heart disease Father   . Colon cancer Neg Hx   . Stomach cancer Neg Hx    Allergies  Allergen Reactions  . Morphine Nausea Only   Prior to Admission medications   Medication Sig Start Date End Date Taking? Authorizing Provider  acetaminophen (TYLENOL) 325 MG tablet Take 650 mg by mouth every 6 (six) hours as needed for mild pain or headache.   Yes [provider]  amLODipine (NORVASC) 10 MG tablet TAKE 1 TABLET ONCE DAILY. Patient taking differently: Take 10 mg by mouth daily.  01/29/20  Yes Bensimhon, Bevelyn Buckles, MD  aspirin 81 MG tablet Take 81 mg by mouth daily.   Yes [provider]   atorvastatin (LIPITOR) 80 MG tablet TAKE ONE TABLET BY MOUTH DAILY Patient taking differently: Take 80 mg by mouth daily.  12/03/19  Yes Bensimhon, Bevelyn Buckles, MD  ezetimibe (ZETIA) 10 MG tablet TAKE 1 TABLET BY MOUTH DAILY. Patient taking differently: Take 10 mg by mouth daily.  01/14/20  Yes Bensimhon, Bevelyn Buckles, MD  ibuprofen (ADVIL) 200 MG tablet Take 400 mg by mouth every 6 (six) hours as needed for moderate pain.    Yes [provider]  losartan (COZAAR) 100 MG tablet Take 1 tablet (100 mg total) by mouth daily. NEEDS APPT 04/14/20  Yes Bensimhon, Bevelyn Buckles, MD  Omega-3 Fatty Acids (FISH OIL) 1000 MG CAPS Take 2,000 mg by mouth daily.   Yes [provider]  pantoprazole (PROTONIX) 40 MG tablet TAKE 1 TABLET ONCE DAILY. Patient taking differently: Take 40 mg by mouth daily.  10/18/19  Yes Bensimhon, Bevelyn Buckles, MD  spironolactone (ALDACTONE) 25 MG tablet TAKE (1/2) TABLET DAILY. Patient taking differently: Take 25 mg by mouth daily.  10/18/19  Yes Bensimhon, Bevelyn Buckles, MD  Tamsulosin HCl (FLOMAX) 0.4 MG CAPS Take 0.8 mg by mouth at bedtime.    Yes [provider]  amoxicillin (AMOXIL) 500 MG capsule Take 2,000 mg by mouth See admin instructions. Take 2000 mg by mouth 1 hour prior to having dental procedures    [provider]  nitroGLYCERIN (NITROSTAT) 0.4 MG SL tablet Place 0.4 mg under the tongue every 5 (five) minutes as needed for chest pain.    [provider]     Positive ROS: All other systems have been reviewed and were otherwise negative with the exception of those mentioned in the HPI and as above.  Physical Exam: General: Alert, no acute distress Cardiovascular: No pedal edema Respiratory: No cyanosis, no use of accessory musculature GI: No organomegaly, abdomen is soft and non-tender Skin: No lesions in the area of chief complaint Neurologic: Sensation intact distally Psychiatric: Patient is competent for consent with normal mood and  affect Lymphatic: No axillary or cervical lymphadenopathy  MUSCULOSKELETAL: Right hip has a painful arc of motion, 0 to 85 degrees, EHL and FHL are intact.  Assessment: Right hip primary localized osteoarthritis   Plan: Plan for Procedure(s): TOTAL HIP ARTHROPLASTY  The risks benefits and alternatives were discussed with the patient including but not limited to the risks of nonoperative treatment, versus surgical intervention including infection, bleeding, nerve injury, periprosthetic fracture, the need for revision surgery, dislocation, leg length discrepancy, blood clots, cardiopulmonary complications, morbidity, mortality, among others, and they were willing to proceed.    Patient's anticipated LOS is less than 2 midnights, meeting these requirements: - Younger than 60 - Lives within 1 hour  of care - Has a competent adult at home to recover with post-op recover - NO history of  - Chronic pain requiring opiods  - Diabetes  - Coronary Artery Disease  - Heart failure  - Heart attack  - Stroke  - DVT/VTE  - Cardiac arrhythmia  - Respiratory Failure/COPD  - Renal failure  - Anemia  - Advanced Liver disease        Eulas Post, MD Cell [phone]   04/22/2020 9:42 AM

## 2020-04-22 NOTE — Anesthesia Postprocedure Evaluation (Signed)
Anesthesia Post Note  Patient: [name]  Procedure(s) Performed: TOTAL HIP ARTHROPLASTY (Right Hip)     Patient location during evaluation: PACU Anesthesia Type: Spinal Level of consciousness: oriented and awake and alert Pain management: pain level controlled Vital Signs Assessment: post-procedure vital signs reviewed and stable Respiratory status: spontaneous breathing, respiratory function stable and nonlabored ventilation Cardiovascular status: blood pressure returned to baseline and stable Postop Assessment: no headache, no backache, no apparent nausea or vomiting, spinal receding and patient able to bend at knees Anesthetic complications: no   No complications documented.  Last Vitals:  Vitals:   04/22/20 1345 04/22/20 1400  BP: (!) 121/56   Pulse: (!) 50 (!) 47  Resp: 16 12  Temp:    SpO2: 100% 96%    Last Pain:  Vitals:   04/22/20 1345  TempSrc:   PainSc: 4                  Dymphna Wadley A.

## 2020-04-23 ENCOUNTER — Encounter (HOSPITAL_COMMUNITY): Payer: Self-pay | Admitting: Orthopedic Surgery

## 2020-04-23 LAB — CBC
HCT: 32.7 % — ABNORMAL LOW (ref 39.0–52.0)
Hemoglobin: 11.1 g/dL — ABNORMAL LOW (ref 13.0–17.0)
MCH: 33.1 pg (ref 26.0–34.0)
MCHC: 33.9 g/dL (ref 30.0–36.0)
MCV: 97.6 fL (ref 80.0–100.0)
Platelets: 213 10*3/uL (ref 150–400)
RBC: 3.35 MIL/uL — ABNORMAL LOW (ref 4.22–5.81)
RDW: 12.4 % (ref 11.5–15.5)
WBC: 13.3 10*3/uL — ABNORMAL HIGH (ref 4.0–10.5)
nRBC: 0 % (ref 0.0–0.2)

## 2020-04-23 LAB — BASIC METABOLIC PANEL
Anion gap: 8 (ref 5–15)
BUN: 23 mg/dL (ref 8–23)
CO2: 22 mmol/L (ref 22–32)
Calcium: 8.9 mg/dL (ref 8.9–10.3)
Chloride: 102 mmol/L (ref 98–111)
Creatinine, Ser: 1.01 mg/dL (ref 0.61–1.24)
GFR calc Af Amer: >60 mL/min (ref >60)
GFR calc non Af Amer: >60 mL/min (ref >60)
Glucose, Bld: 179 mg/dL — ABNORMAL HIGH (ref 70–99)
Potassium: 4.8 mmol/L (ref 3.5–5.1)
Sodium: 132 mmol/L — ABNORMAL LOW (ref 135–145)

## 2020-04-23 MED ORDER — OXYCODONE HCL 5 MG PO TABS: 5.0000 mg | ORAL_TABLET | ORAL | 0 refills | Status: DC | PRN

## 2020-04-23 MED ORDER — SODIUM CHLORIDE 0.9 % IV SOLN: INTRAVENOUS | Status: DC

## 2020-04-23 MED ORDER — SENNA-DOCUSATE SODIUM 8.6-50 MG PO TABS
2.0000 | ORAL_TABLET | Freq: Every day | ORAL | 1 refills | Status: DC
Start: 2020-04-23 — End: 2020-06-30

## 2020-04-23 MED ORDER — ASPIRIN EC 325 MG PO TBEC
325.0000 mg | DELAYED_RELEASE_TABLET | Freq: Two times a day (BID) | ORAL | 0 refills | Status: DC
Start: 2020-04-23 — End: 2020-06-30

## 2020-04-23 MED ORDER — ONDANSETRON HCL 4 MG PO TABS: 4.0000 mg | ORAL_TABLET | Freq: Three times a day (TID) | ORAL | 0 refills | Status: DC | PRN

## 2020-04-23 MED ORDER — BACLOFEN 10 MG PO TABS
10.0000 mg | ORAL_TABLET | Freq: Three times a day (TID) | ORAL | 0 refills | Status: DC
Start: 2020-04-23 — End: 2020-04-29

## 2020-04-23 NOTE — Progress Notes (Signed)
Physical Therapy Treatment Patient Details Name: [name] MRN: [id number] DOB: 12/20/1940 Today's Date: 04/23/2020    History of Present Illness Patient is 79 y.o. male s/p Rt THA via posterolateral approach on 04/22/20 with PMH significant for HLD, HTN, CAD, CABGx4 in 2012.    PT Comments    POD # 1 am session Assisted OOB to amb in hallway Then returned to room to perform some TE's following HEP handout.  Instructed on proper tech, freq as well as use of ICE.     Follow Up Recommendations  Follow surgeon's recommendation for DC plan and follow-up therapies;Outpatient PT     Equipment Recommendations  None recommended by PT    Recommendations for Other Services       Precautions / Restrictions Precautions Precautions: Fall;Posterior Hip Precaution Booklet Issued: Yes (comment) Precaution Comments: recalled 2/3 THP so re educated and demonstrated Restrictions Weight Bearing Restrictions: No RLE Weight Bearing: Weight bearing as tolerated    Mobility  Bed Mobility Overal bed mobility: Needs Assistance Bed Mobility: Supine to Sit     Supine to sit: Supervision;Min guard     General bed mobility comments: demonstrated and instructed how to use a belt to self assist LE off bed  Transfers Overall transfer level: Needs assistance Equipment used: Rolling walker (2 wheeled) Transfers: Sit to/from Stand Sit to Stand: Supervision;Min guard         General transfer comment: cues for safe technique with RW and avoid hip flex > 90 degrees  Ambulation/Gait Ambulation/Gait assistance: Min assist Gait Distance (Feet): 55 Feet Assistive device: Rolling walker (2 wheeled) Gait Pattern/deviations: Step-to pattern Gait velocity: decr   General Gait Details: 25% VC's on proper sequencing and proper walker to self distance.   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        Pertinent Vitals/Pain Pain Score: 6  Pain Location: Rt hip Pain Descriptors / Indicators: Aching;Discomfort;Tender Pain Intervention(s): Monitored during session;Premedicated before session;Repositioned;Ice applied    Home Living                      Prior Function            PT Goals (current goals can now be found in the care plan section) Progress towards PT goals: Progressing toward goals    Frequency    7X/week      PT Plan Current plan remains appropriate    Co-evaluation              AM-PAC PT "6 Clicks" Mobility   Outcome Measure  Help needed turning from your back to your side while in a flat bed without using bedrails?: A Little Help  needed moving from lying on your back to sitting on the side of a flat bed without using bedrails?: A Little Help needed moving to and from a bed to a chair (including a wheelchair)?: A Little Help needed standing up from a chair using your arms (e.g., wheelchair or bedside chair)?: A Little Help needed to walk in hospital room?: A Little Help needed climbing 3-5 steps with a railing? : A Little 6 Click Score: 18    End of Session Equipment Utilized During Treatment: Gait belt Activity Tolerance: Treatment limited secondary to medical complications (Comment);Patient tolerated treatment well Patient left: in chair;with call bell/phone within reach Nurse Communication: Mobility status PT Visit Diagnosis: Muscle weakness (generalized) (M62.81);Difficulty in walking, not elsewhere classified (R26.2)     Time: 1025-1050 PT Time Calculation (min) (ACUTE ONLY): 25 min  Charges:  $Gait  Training: 8-22 mins $Therapeutic Activity: 8-22 mins                     Felecia Shelling  PTA Acute  Rehabilitation Services Pager      [phone] Office      [phone]

## 2020-04-23 NOTE — Progress Notes (Signed)
Pt and Wife provided with d/c instructions. After discussing the pt's plan of care upon d/c home, pt denied any further questions or concerns.

## 2020-04-23 NOTE — Discharge Summary (Signed)
Discharge Summary  Patient ID: [name] MRN: [id number] DOB/AGE: 77/12/1940 77 y.o.  Admit date: 04/22/2020 Discharge date: 04/23/2020  Admission Diagnoses:  Osteoarthritis of right hip   Discharge Diagnoses:  Active Problems:   Osteoarthritis of right hip   Past Medical History:  Diagnosis Date  . CAD (coronary artery disease)    s/p multiple PCIs to RCA dating back to 711983. Last stent to proximal RCA in November of 2011. Also had 99% stenosis in the distal PDA with left to right collaterals and nonobstructive disease in the left coronary tree. ;  ISR of RCA 10/12 - CABG 10/9 with Dr. Zenaida NieceVan Trigt: L-LAD, S-OM, S-PDA  . 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 (stable)  . HTN (hypertension)   . Hyperlipidemia     Surgeries: Procedure(s): TOTAL HIP ARTHROPLASTY on 04/22/2020   Consultants (if any):   Discharged Condition: Improved  Hospital Course: [name] is an 79 y.o. male who was admitted 04/22/2020 with a diagnosis of osteoarthritis of right hip and went to the operating room on 04/22/2020 and underwent the above named procedures.    He was given perioperative antibiotics:  Anti-infectives (From admission, onward)   Start     Dose/Rate Route Frequency Ordered Stop   04/22/20 1630  ceFAZolin (ANCEF) IVPB 2g/100 mL premix        2 g 200 mL/hr over 30 Minutes Intravenous Every 6 hours 04/22/20 1502 04/22/20 2215   04/22/20 0845  ceFAZolin (ANCEF) IVPB 2g/100 mL premix        2 g 200 mL/hr over 30 Minutes Intravenous On call to O.R. 04/22/20 [id number] 04/22/20 1109    .  He was given sequential compression devices, early ambulation, and aspirin for DVT prophylaxis.  He benefited maximally from the hospital stay and there were no complications.    Recent vital signs:  Vitals:   04/23/20 0935 04/23/20 1410  BP: (!) 162/57 (!) 149/53  Pulse: 65 62  Resp: 18 14  Temp: 98.6 F (37 C) 98.8 F (37.1 C)  SpO2: 97% 97%    Recent laboratory studies:   Lab Results  Component Value Date   HGB 11.1 (L) 04/23/2020   HGB 12.8 (L) 04/15/2020   HGB 12.0 (L) 04/01/2020   Lab Results  Component Value Date   WBC 13.3 (H) 04/23/2020   PLT 213 04/23/2020   Lab Results  Component Value Date   INR 1.37 06/22/2011   Lab Results  Component Value Date   NA 132 (L) 04/23/2020   K 4.8 04/23/2020   CL 102 04/23/2020   CO2 22 04/23/2020   BUN 23 04/23/2020   CREATININE 1.01 04/23/2020   GLUCOSE 179 (H) 04/23/2020    Discharge Medications:   Allergies as of 04/23/2020      Reactions   Morphine Nausea Only      Medication List    STOP taking these medications   acetaminophen 325 MG tablet Commonly known as: TYLENOL   amoxicillin 500 MG capsule Commonly known as: AMOXIL   aspirin 81 MG tablet Replaced by: aspirin EC 325 MG tablet   ibuprofen 200 MG tablet Commonly known as: ADVIL     TAKE these medications   amLODipine 10 MG tablet Commonly known as: NORVASC TAKE 1 TABLET ONCE DAILY.   aspirin EC 325 MG tablet Take 1 tablet (325 mg total) by mouth 2 (two) times daily. Replaces: aspirin 81 MG tablet   atorvastatin 80 MG tablet Commonly known  as: LIPITOR TAKE ONE TABLET BY MOUTH DAILY   baclofen 10 MG tablet Commonly known as: LIORESAL Take 1 tablet (10 mg total) by mouth 3 (three) times daily. As needed for muscle spasm   ezetimibe 10 MG tablet Commonly known as: ZETIA TAKE 1 TABLET BY MOUTH DAILY.   Fish Oil 1000 MG Caps Take 2,000 mg by mouth daily.   losartan 100 MG tablet Commonly known as: COZAAR Take 1 tablet (100 mg total) by mouth daily. NEEDS APPT   nitroGLYCERIN 0.4 MG SL tablet Commonly known as: NITROSTAT Place 0.4 mg under the tongue every 5 (five) minutes as needed for chest pain.   ondansetron 4 MG tablet Commonly known as: Zofran Take 1 tablet (4 mg total) by mouth every 8 (eight) hours as needed for nausea or vomiting.   oxyCODONE 5 MG immediate release tablet Commonly known as:  Roxicodone Take 1 tablet (5 mg total) by mouth every 4 (four) hours as needed for severe pain.   pantoprazole 40 MG tablet Commonly known as: PROTONIX TAKE 1 TABLET ONCE DAILY.   sennosides-docusate sodium 8.6-50 MG tablet Commonly known as: SENOKOT-S Take 2 tablets by mouth daily.   spironolactone 25 MG tablet Commonly known as: ALDACTONE TAKE (1/2) TABLET DAILY. What changed: See the new instructions.   tamsulosin 0.4 MG Caps capsule Commonly known as: FLOMAX Take 0.8 mg by mouth at bedtime.            Durable Medical Equipment  (From admission, onward)         Start     Ordered   04/23/20 0925  For home use only DME Shower stool  Once        04/23/20 0925          Diagnostic Studies: DG Pelvis Portable  Result Date: 04/22/2020 CLINICAL DATA:  Right hip arthroplasty today. EXAM: PORTABLE PELVIS 1-2 VIEWS COMPARISON:  Concurrent hip exam. FINDINGS: Recent right hip arthroplasty in expected alignment. No periprosthetic lucency. Distal aspect of the femoral stem is included on concurrent right hip exam, reported separately. Recent postsurgical change includes air and edema in the soft tissues. Left hip arthroplasty is intact where visualized. IMPRESSION: Recent right hip arthroplasty in expected alignment. No immediate postoperative complication. Electronically Signed   By: Narda Rutherford M.D.   On: 04/22/2020 16:21   DG Hip Port Unilat With Pelvis 1V Right  Result Date: 04/22/2020 CLINICAL DATA:  Status post right hip arthroplasty. EXAM: DG HIP (WITH OR WITHOUT PELVIS) 1V PORT RIGHT COMPARISON:  None. FINDINGS: Right hip arthroplasty in expected alignment. No periprosthetic lucency or fracture. Recent postsurgical change includes air and edema in the soft tissues and joint space. IMPRESSION: Right hip arthroplasty without immediate postoperative complication. Electronically Signed   By: Narda Rutherford M.D.   On: 04/22/2020 16:19   VAS US CAROTID  Result Date:  04/08/2020 Carotid Arterial Duplex Study Indications:       Carotid artery disease , pre-operative exam. Risk Factors:      Hypertension, hyperlipidemia, coronary artery disease. Comparison Study:  Increased ICA velocity bilaterally Performing Technologist: Elita Quick RVT  Examination Guidelines: A complete evaluation includes B-mode imaging, spectral Doppler, color Doppler, and power Doppler as needed of all accessible portions of each vessel. Bilateral testing is considered an integral part of a complete examination. Limited examinations for reoccurring indications may be performed as noted.  Right Carotid Findings: +----------+--------+--------+--------+-------------------------+--------+           PSV cm/sEDV cm/sStenosisPlaque Description  Comments +----------+--------+--------+--------+-------------------------+--------+ CCA Prox  106     9                                                 +----------+--------+--------+--------+-------------------------+--------+ CCA Mid   86      14              heterogenous                      +----------+--------+--------+--------+-------------------------+--------+ CCA Distal102     20              heterogenous                      +----------+--------+--------+--------+-------------------------+--------+ ICA Prox  336     73      60-79%  heterogenous and calcific         +----------+--------+--------+--------+-------------------------+--------+ ICA Mid   117     29                                                +----------+--------+--------+--------+-------------------------+--------+ ICA Distal104     23                                                +----------+--------+--------+--------+-------------------------+--------+ ECA       542     12      >50%                                      +----------+--------+--------+--------+-------------------------+--------+  +----------+--------+-------+----------------+-------------------+           PSV cm/sEDV cmsDescribe        Arm Pressure (mmHG) +----------+--------+-------+----------------+-------------------+ Subclavian202     4      Multiphasic, WNL                    +----------+--------+-------+----------------+-------------------+ +---------+--------+--+--------+--+---------+ VertebralPSV cm/s86EDV cm/s18Antegrade +---------+--------+--+--------+--+---------+  Left Carotid Findings: +----------+--------+--------+--------+------------------+--------+           PSV cm/sEDV cm/sStenosisPlaque DescriptionComments +----------+--------+--------+--------+------------------+--------+ CCA Prox  127     14                                         +----------+--------+--------+--------+------------------+--------+ CCA Mid   101     15              heterogenous               +----------+--------+--------+--------+------------------+--------+ CCA Distal116     18              heterogenous               +----------+--------+--------+--------+------------------+--------+ ICA Prox  343     75      60-79%  calcific                   +----------+--------+--------+--------+------------------+--------+ ICA Mid   256  47                                         +----------+--------+--------+--------+------------------+--------+ ICA Distal96      19                                         +----------+--------+--------+--------+------------------+--------+ ECA       153     4                                          +----------+--------+--------+--------+------------------+--------+ +----------+--------+--------+----------------+-------------------+           PSV cm/sEDV cm/sDescribe        Arm Pressure (mmHG) +----------+--------+--------+----------------+-------------------+ [id number]     8       Multiphasic, WNL                     +----------+--------+--------+----------------+-------------------+ +---------+--------+---+--------+--+---------+ VertebralPSV cm/s100EDV cm/s21Antegrade +---------+--------+---+--------+--+---------+   Summary: Right Carotid: Velocities in the right ICA are consistent with a 60-79%                stenosis. Left Carotid: Velocities in the left ICA are consistent with a 60-79% stenosis. Vertebrals: Bilateral vertebral arteries demonstrate antegrade flow.              NOTE: Calcific plaque may obscure higher velocity. *See table(s) above for measurements and observations.  Electronically signed by Gretta Began MD on 04/08/2020 at 10:09:07 AM.    Final     Disposition: Discharge disposition: 01-Home or Self Care          Follow-up Information    Teryl Lucy, MD. Schedule an appointment as soon as possible for a visit in 2 week(s).   Specialty: Orthopedic Surgery Contact information: [address] [place] [address] [phone]                Signed: Annita Brod 04/23/2020, 2:12 PM

## 2020-04-23 NOTE — Progress Notes (Signed)
Subjective: 1 Day Post-Op s/p Procedure(s): TOTAL HIP ARTHROPLASTY  Pain is moderate at right hip. States he had some lightheadedness and "feeling hot all over" after working with PT yesterday. Otherwise no complaints. States he feels much improved this morning.    Objective:  PE: VITALS:   Vitals:   04/22/20 2056 04/22/20 2242 04/23/20 0103 04/23/20 0542  BP: (!) 155/56  (!) 156/54 (!) 161/56  Pulse: (!) 56  (!) 52 70  Resp: 16  16 18   Temp: (!) 97.5 F (36.4 C)  (!) 97.5 F (36.4 C) 97.9 F (36.6 C)  TempSrc: Oral  Oral Oral  SpO2: 97%  98% 99%  Weight:  86.2 kg    Height:  5\' 7"  (1.702 m)      ABD soft Neurovascular intact Sensation intact distally Intact pulses distally Dorsiflexion/Plantar flexion intact Incision: no drainage  LABS  Results for orders placed or performed during the hospital encounter of 04/22/20 (from the past 24 hour(s))  CBC     Status: Abnormal   Collection Time: 04/23/20  2:49 AM  Result Value Ref Range   WBC 13.3 (H) 4.0 - 10.5 K/uL   RBC 3.35 (L) 4.22 - 5.81 MIL/uL   Hemoglobin 11.1 (L) 13.0 - 17.0 g/dL   HCT 06/22/20 (L) 39 - 52 %   MCV 97.6 80.0 - 100.0 fL   MCH 33.1 26.0 - 34.0 pg   MCHC 33.9 30.0 - 36.0 g/dL   RDW 06/23/20 16.1 - 09.6 %   Platelets 213 150 - 400 K/uL   nRBC 0.0 0.0 - 0.2 %  Basic metabolic panel     Status: Abnormal   Collection Time: 04/23/20  2:49 AM  Result Value Ref Range   Sodium 132 (L) 135 - 145 mmol/L   Potassium 4.8 3.5 - 5.1 mmol/L   Chloride 102 98 - 111 mmol/L   CO2 22 22 - 32 mmol/L   Glucose, Bld 179 (H) 70 - 99 mg/dL   BUN 23 8 - 23 mg/dL   Creatinine, Ser 40.9 0.61 - 1.24 mg/dL   Calcium 8.9 8.9 - 06/23/20 mg/dL   GFR calc non Af Amer >60 >60 mL/min   GFR calc Af Amer >60 >60 mL/min   Anion gap 8 5 - 15    DG Pelvis Portable  Result Date: 04/22/2020 CLINICAL DATA:  Right hip arthroplasty today. EXAM: PORTABLE PELVIS 1-2 VIEWS COMPARISON:  Concurrent hip exam. FINDINGS: Recent right hip  arthroplasty in expected alignment. No periprosthetic lucency. Distal aspect of the femoral stem is included on concurrent right hip exam, reported separately. Recent postsurgical change includes air and edema in the soft tissues. Left hip arthroplasty is intact where visualized. IMPRESSION: Recent right hip arthroplasty in expected alignment. No immediate postoperative complication. Electronically Signed   By: 91.4 M.D.   On: 04/22/2020 16:21   DG Hip Port Unilat With Pelvis 1V Right  Result Date: 04/22/2020 CLINICAL DATA:  Status post right hip arthroplasty. EXAM: DG HIP (WITH OR WITHOUT PELVIS) 1V PORT RIGHT COMPARISON:  None. FINDINGS: Right hip arthroplasty in expected alignment. No periprosthetic lucency or fracture. Recent postsurgical change includes air and edema in the soft tissues and joint space. IMPRESSION: Right hip arthroplasty without immediate postoperative complication. Electronically Signed   By: 06/22/2020 M.D.   On: 04/22/2020 16:19    Assessment/Plan: Active Problems:   Osteoarthritis of right hip  1 Day Post-Op s/p Procedure(s): TOTAL HIP ARTHROPLASTY  HTN: - BP's  have been running high this morning, will keep an eye on vitals once patient has been given his home HTN medications  Weightbearing: WBAT RLE, up with therapy today - able to walk 8 feet yesterday with lightheadedness.  Insicional and dressing care: Reinforce dressings as needed VTE prophylaxis: Aspirin 325mg  BID x 30 days Pain control: continue current regimen Follow - up plan: 2 weeks with Dr. Dispo: Likely able to d/c later today depending on progress with PT.   Contact information:   Weekdays 8-5 05-17-2006, Janine Ores [place] A fter hours and holidays please check [email or web address] for group call information for [facility]  [phone] 04/23/2020, 7:55 AM

## 2020-04-23 NOTE — TOC Transition Note (Addendum)
Transition of Care [facility]) - CM/SW Discharge Note   Patient Details  Name: [name] MRN: [id number] Date of Birth: 1940/12/05  [facility]) CM/SW Contact:  Clearance Coots, LCSW Phone Number: 04/23/2020, 10:06 AM   Clinical Narrative:    Home Health orders. Patient declines HHPT, patient reports he will go straight to OPPT on Friday. Patient requested a shower chair and asked it be delivered to his home. CSW made order to [employer].  CSW left message w/PA to clarify pt. Therapy plan.   Final next level of care: OP Rehab Barriers to Discharge: Barriers Resolved   Patient Goals and CMS Choice     Choice offered to / list presented to : NA  Discharge Placement                       Discharge Plan and Services                DME Arranged: Shower stool DME Agency: [employer] Date DME Agency Contacted: 04/23/20 Time DME Agency Contacted: 1004 Representative spoke with at DME Agency: Ian Malkin            Social Determinants of Health (SDOH) Interventions     Readmission Risk Interventions No flowsheet data found.

## 2020-04-23 NOTE — Discharge Instructions (Signed)
INSTRUCTIONS AFTER JOINT REPLACEMENT   o Remove items at home which could result in a fall. This includes throw rugs or furniture in walking pathways o ICE to the affected joint every three hours while awake for 30 minutes at a time, for at least the first 3-5 days, and then as needed for pain and swelling.  Continue to use ice for pain and swelling. You may notice swelling that will progress down to the foot and ankle.  This is normal after surgery.  Elevate your leg when you are not up walking on it.   o Continue to use the breathing machine you got in the hospital (incentive spirometer) which will help keep your temperature down.  It is common for your temperature to cycle up and down following surgery, especially at night when you are not up moving around and exerting yourself.  The breathing machine keeps your lungs expanded and your temperature down.   DIET:  As you were doing prior to hospitalization, we recommend a well-balanced diet.  DRESSING / WOUND CARE / SHOWERING  You may change your dressing 3-5 days after surgery.  Then change the dressing every day with sterile gauze.  Please use good hand washing techniques before changing the dressing.  Do not use any lotions or creams on the incision until instructed by your surgeon.  ACTIVITY  o Increase activity slowly as tolerated, but follow the weight bearing instructions below.   o No driving for 6 weeks or until further direction given by your physician.  You cannot drive while taking narcotics.  o No lifting or carrying greater than 10 lbs. until further directed by your surgeon. o Avoid periods of inactivity such as sitting longer than an hour when not asleep. This helps prevent blood clots.  o You may return to work once you are authorized by your doctor.     WEIGHT BEARING   Weight bearing as tolerated with assist device (walker, cane, etc) as directed, use it as long as suggested by your surgeon or therapist, typically at  least 4-6 weeks.   EXERCISES  Results after joint replacement surgery are often greatly improved when you follow the exercise, range of motion and muscle strengthening exercises prescribed by your doctor. Safety measures are also important to protect the joint from further injury. Any time any of these exercises cause you to have increased pain or swelling, decrease what you are doing until you are comfortable again and then slowly increase them. If you have problems or questions, call your caregiver or physical therapist for advice.   Rehabilitation is important following a joint replacement. After just a few days of immobilization, the muscles of the leg can become weakened and shrink (atrophy).  These exercises are designed to build up the tone and strength of the thigh and leg muscles and to improve motion. Often times heat used for twenty to thirty minutes before working out will loosen up your tissues and help with improving the range of motion but do not use heat for the first two weeks following surgery (sometimes heat can increase post-operative swelling).   These exercises can be done on a training (exercise) mat, on the floor, on a table or on a bed. Use whatever works the best and is most comfortable for you.    Use music or television while you are exercising so that the exercises are a pleasant break in your day. This will make your life better with the exercises acting as a break   in your routine that you can look forward to.   Perform all exercises about fifteen times, three times per day or as directed.  You should exercise both the operative leg and the other leg as well.  Exercises include:   . Quad Sets - Tighten up the muscle on the front of the thigh (Quad) and hold for 5-10 seconds.   . Straight Leg Raises - With your knee straight (if you were given a brace, keep it on), lift the leg to 60 degrees, hold for 3 seconds, and slowly lower the leg.  Perform this exercise against  resistance later as your leg gets stronger.  . Leg Slides: Lying on your back, slowly slide your foot toward your buttocks, bending your knee up off the floor (only go as far as is comfortable). Then slowly slide your foot back down until your leg is flat on the floor again.  . Angel Wings: Lying on your back spread your legs to the side as far apart as you can without causing discomfort.  . Hamstring Strength:  Lying on your back, push your heel against the floor with your leg straight by tightening up the muscles of your buttocks.  Repeat, but this time bend your knee to a comfortable angle, and push your heel against the floor.  You may put a pillow under the heel to make it more comfortable if necessary.   A rehabilitation program following joint replacement surgery can speed recovery and prevent re-injury in the future due to weakened muscles. Contact your doctor or a physical therapist for more information on knee rehabilitation.    CONSTIPATION  Constipation is defined medically as fewer than three stools per week and severe constipation as less than one stool per week.  Even if you have a regular bowel pattern at home, your normal regimen is likely to be disrupted due to multiple reasons following surgery.  Combination of anesthesia, postoperative narcotics, change in appetite and fluid intake all can affect your bowels.   YOU MUST use at least one of the following options; they are listed in order of increasing strength to get the job done.  They are all available over the counter, and you may need to use some, POSSIBLY even all of these options:    Drink plenty of fluids (prune juice may be helpful) and high fiber foods Colace 100 mg by mouth twice a day  Senokot for constipation as directed and as needed Dulcolax (bisacodyl), take with full glass of water  Miralax (polyethylene glycol) once or twice a day as needed.  If you have tried all these things and are unable to have a bowel  movement in the first 3-4 days after surgery call either your surgeon or your primary doctor.    If you experience loose stools or diarrhea, hold the medications until you stool forms back up.  If your symptoms do not get better within 1 week or if they get worse, check with your doctor.  If you experience "the worst abdominal pain ever" or develop nausea or vomiting, please contact the office immediately for further recommendations for treatment.   ITCHING:  If you experience itching with your medications, try taking only a single pain pill, or even half a pain pill at a time.  You can also use Benadryl over the counter for itching or also to help with sleep.   TED HOSE STOCKINGS:  Use stockings on both legs until for at least 2 weeks or as   directed by physician office. They may be removed at night for sleeping.  MEDICATIONS:  See your medication summary on the "After Visit Summary" that nursing will review with you.  You may have some home medications which will be placed on hold until you complete the course of blood thinner medication.  It is important for you to complete the blood thinner medication as prescribed.  PRECAUTIONS:  If you experience chest pain or shortness of breath - call 911 immediately for transfer to the hospital emergency department.   If you develop a fever greater that 101 F, purulent drainage from wound, increased redness or drainage from wound, foul odor from the wound/dressing, or calf pain - CONTACT YOUR SURGEON.                                                   FOLLOW-UP APPOINTMENTS:  If you do not already have a post-op appointment, please call the office for an appointment to be seen by your surgeon.  Guidelines for how soon to be seen are listed in your "After Visit Summary", but are typically between 1-4 weeks after surgery.  OTHER INSTRUCTIONS:    DENTAL ANTIBIOTICS:  In most cases prophylactic antibiotics for Dental procdeures after total joint surgery are  not necessary.  Exceptions are as follows:  1. History of prior total joint infection  2. Severely immunocompromised (Organ Transplant, cancer chemotherapy, Rheumatoid biologic meds such as Humera)  3. Poorly controlled diabetes (A1C &gt; 8.0, blood glucose over 200)  If you have one of these conditions, contact your surgeon for an antibiotic prescription, prior to your dental procedure.   MAKE SURE YOU:  . Understand these instructions.  . Get help right away if you are not doing well or get worse.    Thank you for letting us be a part of your medical care team.  It is a privilege we respect greatly.  We hope these instructions will help you stay on track for a fast and full recovery!   

## 2020-04-23 NOTE — Progress Notes (Signed)
Physical Therapy Treatment Patient Details Name: [name] MRN: [id number] DOB: Aug 06, 1941 Today's Date: 04/23/2020    History of Present Illness Patient is 79 y.o. male s/p Rt THA via posterolateral approach on 04/22/20 with PMH significant for HLD, HTN, CAD, CABGx4 in 2012.    PT Comments    POD # 1 pm session Spouse present.  Assisted with amb a greater distance in hallway, practiced stairs and reviewed/performed THR TE's following HEP handout.  Addressed all mobility questions, discussed appropriate activity, educated on use of ICE.  Pt ready for D/C to home.   Follow Up Recommendations  Follow surgeon's recommendation for DC plan and follow-up therapies;Outpatient PT     Equipment Recommendations  None recommended by PT    Recommendations for Other Services       Precautions / Restrictions Precautions Precautions: Fall;Posterior Hip Precaution Booklet Issued: Yes (comment) Precaution Comments: recalled 2/3 THP so re educated and demonstrated Restrictions Weight Bearing Restrictions: No RLE Weight Bearing: Weight bearing as tolerated    Mobility  Bed Mobility Overal bed mobility: Needs Assistance Bed Mobility: Supine to Sit     Supine to sit: Supervision;Min guard     General bed mobility comments: demonstrated and instructed how to use a belt to self assist LE off bed  Transfers Overall transfer level: Needs assistance Equipment used: Rolling walker (2 wheeled) Transfers: Sit to/from Stand Sit to Stand: Supervision;Min guard         General transfer comment: cues for safe technique with RW and avoid hip flex > 90 degrees  Ambulation/Gait Ambulation/Gait assistance: Supervision Gait Distance (Feet): 75 Feet Assistive device: Rolling walker (2 wheeled) Gait Pattern/deviations: Step-to pattern Gait velocity: decr   General Gait Details: 25% VC's on proper sequencing and proper walker to self distance.   Stairs Stairs: Yes Stairs assistance:  Supervision;Min guard Stair Management: Two rails;Forwards Number of Stairs: 6 General stair comments: 25% VC's on proper tech.  Spouse present.   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        Pertinent Vitals/Pain Pain Score: 6  Pain Location: Rt hip Pain Descriptors / Indicators: Aching;Discomfort;Tender Pain Intervention(s): Monitored during session;Premedicated before session;Repositioned;Ice applied    Home Living                      Prior Function            PT Goals (current goals can now be found in the care plan section) Progress towards PT goals: Progressing toward goals    Frequency    7X/week      PT Plan Current plan remains appropriate    Co-evaluation              AM-PAC PT "6 Clicks" Mobility  Outcome Measure  Help needed turning from your back to your side while in a flat bed without using bedrails?: A Little Help needed moving from lying on your back to sitting on the side of a flat bed without using bedrails?: A Little Help needed moving to and from a bed to a chair (including a wheelchair)?: A Little Help needed standing up from a chair using your arms (e.g., wheelchair or bedside chair)?: A Little Help needed to walk in hospital room?: A Little Help needed climbing 3-5 steps with a railing? : A Little 6 Click Score: 18    End of Session Equipment Utilized During Treatment: Gait belt Activity Tolerance: Treatment limited secondary to medical complications (Comment);Patient tolerated treatment well Patient left: in chair;with  call bell/phone within reach Nurse Communication: Mobility status PT Visit Diagnosis: Muscle weakness (generalized) (M62.81);Difficulty in walking, not elsewhere classified (R26.2)     Time: 1320-1400 PT Time Calculation (min) (ACUTE ONLY): 40 min  Charges:  $Gait Training: 8-22 mins $Therapeutic Exercise: 8-22 mins $Therapeutic Activity: 8-22 mins                     Felecia Shelling  PTA Acute  Rehabilitation Services Pager      [phone] Office      [phone]

## 2020-04-25 ENCOUNTER — Other Ambulatory Visit (HOSPITAL_COMMUNITY): Payer: Medicare HMO

## 2020-04-25 ENCOUNTER — Encounter (HOSPITAL_COMMUNITY): Payer: Self-pay | Admitting: Emergency Medicine

## 2020-04-25 ENCOUNTER — Emergency Department (HOSPITAL_COMMUNITY): Payer: Medicare HMO

## 2020-04-25 ENCOUNTER — Inpatient Hospital Stay (HOSPITAL_COMMUNITY)
Admission: EM | Admit: 2020-04-25 | Discharge: 2020-04-29 | DRG: 280 | Disposition: A | Discharge status: Home / Self Care | Payer: Medicare HMO | Attending: Family Medicine | Admitting: Family Medicine

## 2020-04-25 ENCOUNTER — Other Ambulatory Visit: Payer: Self-pay

## 2020-04-25 DIAGNOSIS — D62 Acute posthemorrhagic anemia: Secondary | ICD-10-CM | POA: Diagnosis present

## 2020-04-25 DIAGNOSIS — R262 Difficulty in walking, not elsewhere classified: Secondary | ICD-10-CM | POA: Diagnosis not present

## 2020-04-25 DIAGNOSIS — R410 Disorientation, unspecified: Secondary | ICD-10-CM

## 2020-04-25 DIAGNOSIS — I6529 Occlusion and stenosis of unspecified carotid artery: Secondary | ICD-10-CM | POA: Diagnosis present

## 2020-04-25 DIAGNOSIS — I10 Essential (primary) hypertension: Secondary | ICD-10-CM | POA: Diagnosis present

## 2020-04-25 DIAGNOSIS — I97191 Other postprocedural cardiac functional disturbances following other surgery: Secondary | ICD-10-CM | POA: Diagnosis not present

## 2020-04-25 DIAGNOSIS — E871 Hypo-osmolality and hyponatremia: Secondary | ICD-10-CM | POA: Diagnosis not present

## 2020-04-25 DIAGNOSIS — Z885 Allergy status to narcotic agent status: Secondary | ICD-10-CM | POA: Diagnosis not present

## 2020-04-25 DIAGNOSIS — I119 Hypertensive heart disease without heart failure: Secondary | ICD-10-CM | POA: Diagnosis present

## 2020-04-25 DIAGNOSIS — I6782 Cerebral ischemia: Secondary | ICD-10-CM | POA: Diagnosis not present

## 2020-04-25 DIAGNOSIS — I252 Old myocardial infarction: Secondary | ICD-10-CM | POA: Diagnosis not present

## 2020-04-25 DIAGNOSIS — Z7982 Long term (current) use of aspirin: Secondary | ICD-10-CM

## 2020-04-25 DIAGNOSIS — I2510 Atherosclerotic heart disease of native coronary artery without angina pectoris: Secondary | ICD-10-CM | POA: Diagnosis present

## 2020-04-25 DIAGNOSIS — Z951 Presence of aortocoronary bypass graft: Secondary | ICD-10-CM | POA: Diagnosis not present

## 2020-04-25 DIAGNOSIS — I739 Peripheral vascular disease, unspecified: Secondary | ICD-10-CM | POA: Diagnosis not present

## 2020-04-25 DIAGNOSIS — I959 Hypotension, unspecified: Secondary | ICD-10-CM | POA: Diagnosis not present

## 2020-04-25 DIAGNOSIS — Z96643 Presence of artificial hip joint, bilateral: Secondary | ICD-10-CM | POA: Diagnosis present

## 2020-04-25 DIAGNOSIS — Z79899 Other long term (current) drug therapy: Secondary | ICD-10-CM | POA: Diagnosis not present

## 2020-04-25 DIAGNOSIS — Z96641 Presence of right artificial hip joint: Secondary | ICD-10-CM | POA: Diagnosis not present

## 2020-04-25 DIAGNOSIS — E785 Hyperlipidemia, unspecified: Secondary | ICD-10-CM | POA: Diagnosis not present

## 2020-04-25 DIAGNOSIS — R531 Weakness: Secondary | ICD-10-CM

## 2020-04-25 DIAGNOSIS — R52 Pain, unspecified: Secondary | ICD-10-CM | POA: Diagnosis not present

## 2020-04-25 DIAGNOSIS — I25810 Atherosclerosis of coronary artery bypass graft(s) without angina pectoris: Secondary | ICD-10-CM | POA: Diagnosis not present

## 2020-04-25 DIAGNOSIS — Z20822 Contact with and (suspected) exposure to covid-19: Secondary | ICD-10-CM | POA: Diagnosis not present

## 2020-04-25 DIAGNOSIS — Z96649 Presence of unspecified artificial hip joint: Secondary | ICD-10-CM

## 2020-04-25 DIAGNOSIS — G92 Toxic encephalopathy: Secondary | ICD-10-CM | POA: Diagnosis not present

## 2020-04-25 DIAGNOSIS — I214 Non-ST elevation (NSTEMI) myocardial infarction: Secondary | ICD-10-CM | POA: Diagnosis not present

## 2020-04-25 DIAGNOSIS — N400 Enlarged prostate without lower urinary tract symptoms: Secondary | ICD-10-CM | POA: Diagnosis present

## 2020-04-25 DIAGNOSIS — D649 Anemia, unspecified: Secondary | ICD-10-CM | POA: Diagnosis present

## 2020-04-25 DIAGNOSIS — K219 Gastro-esophageal reflux disease without esophagitis: Secondary | ICD-10-CM | POA: Diagnosis present

## 2020-04-25 DIAGNOSIS — G319 Degenerative disease of nervous system, unspecified: Secondary | ICD-10-CM | POA: Diagnosis not present

## 2020-04-25 DIAGNOSIS — I700 Atherosclerosis of aorta: Secondary | ICD-10-CM | POA: Diagnosis not present

## 2020-04-25 DIAGNOSIS — E860 Dehydration: Secondary | ICD-10-CM | POA: Diagnosis not present

## 2020-04-25 DIAGNOSIS — R404 Transient alteration of awareness: Secondary | ICD-10-CM | POA: Diagnosis not present

## 2020-04-25 LAB — CBC WITH DIFFERENTIAL/PLATELET
Abs Immature Granulocytes: 0.07 10*3/uL (ref 0.00–0.07)
Basophils Absolute: 0 10*3/uL (ref 0.0–0.1)
Basophils Relative: 0 %
Eosinophils Absolute: 0.1 10*3/uL (ref 0.0–0.5)
Eosinophils Relative: 1 %
HCT: 29.7 % — ABNORMAL LOW (ref 39.0–52.0)
Hemoglobin: 9.9 g/dL — ABNORMAL LOW (ref 13.0–17.0)
Immature Granulocytes: 1 %
Lymphocytes Relative: 8 %
Lymphs Abs: 1.3 10*3/uL (ref 0.7–4.0)
MCH: 32.5 pg (ref 26.0–34.0)
MCHC: 33.3 g/dL (ref 30.0–36.0)
MCV: 97.4 fL (ref 80.0–100.0)
Monocytes Absolute: 1.6 10*3/uL — ABNORMAL HIGH (ref 0.1–1.0)
Monocytes Relative: 10 %
Neutro Abs: 12.3 10*3/uL — ABNORMAL HIGH (ref 1.7–7.7)
Neutrophils Relative %: 80 %
Platelets: 213 10*3/uL (ref 150–400)
RBC: 3.05 MIL/uL — ABNORMAL LOW (ref 4.22–5.81)
RDW: 13 % (ref 11.5–15.5)
WBC: 15.4 10*3/uL — ABNORMAL HIGH (ref 4.0–10.5)
nRBC: 0 % (ref 0.0–0.2)

## 2020-04-25 LAB — URINALYSIS, MICROSCOPIC (REFLEX): Bacteria, UA: NONE SEEN

## 2020-04-25 LAB — TROPONIN I (HIGH SENSITIVITY)
Troponin I (High Sensitivity): 111 ng/L (ref <18)
Troponin I (High Sensitivity): 1421 ng/L (ref <18)

## 2020-04-25 LAB — SARS CORONAVIRUS 2 BY RT PCR (HOSPITAL ORDER, PERFORMED IN ~~LOC~~ HOSPITAL LAB): SARS Coronavirus 2: NEGATIVE

## 2020-04-25 LAB — COMPREHENSIVE METABOLIC PANEL
ALT: 18 U/L (ref 0–44)
AST: 35 U/L (ref 15–41)
Albumin: 3.3 g/dL — ABNORMAL LOW (ref 3.5–5.0)
Alkaline Phosphatase: 42 U/L (ref 38–126)
Anion gap: 9 (ref 5–15)
BUN: 22 mg/dL (ref 8–23)
CO2: 20 mmol/L — ABNORMAL LOW (ref 22–32)
Calcium: 8.6 mg/dL — ABNORMAL LOW (ref 8.9–10.3)
Chloride: 102 mmol/L (ref 98–111)
Creatinine, Ser: 0.92 mg/dL (ref 0.61–1.24)
GFR calc Af Amer: >60 mL/min (ref >60)
GFR calc non Af Amer: >60 mL/min (ref >60)
Glucose, Bld: 125 mg/dL — ABNORMAL HIGH (ref 70–99)
Potassium: 4.2 mmol/L (ref 3.5–5.1)
Sodium: 131 mmol/L — ABNORMAL LOW (ref 135–145)
Total Bilirubin: 1.6 mg/dL — ABNORMAL HIGH (ref 0.3–1.2)
Total Protein: 6.6 g/dL (ref 6.5–8.1)

## 2020-04-25 LAB — URINALYSIS, ROUTINE W REFLEX MICROSCOPIC
Bilirubin Urine: NEGATIVE
Glucose, UA: NEGATIVE mg/dL
Ketones, ur: NEGATIVE mg/dL
Leukocytes,Ua: NEGATIVE
Nitrite: NEGATIVE
Protein, ur: NEGATIVE mg/dL
Specific Gravity, Urine: 1.015 (ref 1.005–1.030)
pH: 6 (ref 5.0–8.0)

## 2020-04-25 LAB — PROTIME-INR
INR: 1 (ref 0.8–1.2)
Prothrombin Time: 12.7 seconds (ref 11.4–15.2)

## 2020-04-25 LAB — CBG MONITORING, ED: Glucose-Capillary: 121 mg/dL — ABNORMAL HIGH (ref 70–99)

## 2020-04-25 LAB — APTT: aPTT: 30 seconds (ref 24–36)

## 2020-04-25 LAB — LACTIC ACID, PLASMA: Lactic Acid, Venous: 0.9 mmol/L (ref 0.5–1.9)

## 2020-04-25 MED ORDER — TAMSULOSIN HCL 0.4 MG PO CAPS
0.8000 mg | ORAL_CAPSULE | Freq: Every day | ORAL | Status: DC
  Administered 2020-04-26 – 2020-04-28 (×4): 0.8 mg via ORAL
  Filled 2020-04-25 (×4): qty 2

## 2020-04-25 MED ORDER — LACTATED RINGERS IV BOLUS (SEPSIS)
1000.0000 mL | Freq: Once | INTRAVENOUS | Status: AC
  Administered 2020-04-25: 1000 mL via INTRAVENOUS

## 2020-04-25 MED ORDER — OMEGA-3-ACID ETHYL ESTERS 1 G PO CAPS
2000.0000 mg | ORAL_CAPSULE | Freq: Every day | ORAL | Status: DC
  Administered 2020-04-26 – 2020-04-29 (×4): 2000 mg via ORAL
  Filled 2020-04-25 (×4): qty 2

## 2020-04-25 MED ORDER — ASPIRIN EC 81 MG PO TBEC
81.0000 mg | DELAYED_RELEASE_TABLET | Freq: Every day | ORAL | Status: DC
  Administered 2020-04-26 – 2020-04-29 (×3): 81 mg via ORAL
  Filled 2020-04-25 (×3): qty 1

## 2020-04-25 MED ORDER — ONDANSETRON HCL 4 MG PO TABS: 4.0000 mg | ORAL_TABLET | Freq: Three times a day (TID) | ORAL | Status: DC | PRN

## 2020-04-25 MED ORDER — PANTOPRAZOLE SODIUM 40 MG PO TBEC
40.0000 mg | DELAYED_RELEASE_TABLET | Freq: Every day | ORAL | Status: DC
  Administered 2020-04-26 – 2020-04-29 (×4): 40 mg via ORAL
  Filled 2020-04-25 (×4): qty 1

## 2020-04-25 MED ORDER — ONDANSETRON HCL 4 MG/2ML IJ SOLN: 4.0000 mg | Freq: Four times a day (QID) | INTRAMUSCULAR | Status: DC | PRN

## 2020-04-25 MED ORDER — AMLODIPINE BESYLATE 10 MG PO TABS
10.0000 mg | ORAL_TABLET | Freq: Every day | ORAL | Status: DC
  Administered 2020-04-26 – 2020-04-29 (×4): 10 mg via ORAL
  Filled 2020-04-25 (×3): qty 1
  Filled 2020-04-25: qty 2

## 2020-04-25 MED ORDER — ASPIRIN 81 MG PO CHEW: 324.0000 mg | CHEWABLE_TABLET | ORAL | Status: DC

## 2020-04-25 MED ORDER — LOSARTAN POTASSIUM 50 MG PO TABS
100.0000 mg | ORAL_TABLET | Freq: Every day | ORAL | Status: DC
  Administered 2020-04-26 – 2020-04-29 (×4): 100 mg via ORAL
  Filled 2020-04-25 (×4): qty 2

## 2020-04-25 MED ORDER — ASPIRIN 81 MG PO CHEW
324.0000 mg | CHEWABLE_TABLET | Freq: Once | ORAL | Status: AC
  Administered 2020-04-25: 324 mg via ORAL
  Filled 2020-04-25: qty 4

## 2020-04-25 MED ORDER — ASPIRIN 300 MG RE SUPP: 300.0000 mg | RECTAL | Status: DC

## 2020-04-25 MED ORDER — SPIRONOLACTONE 25 MG PO TABS
25.0000 mg | ORAL_TABLET | Freq: Every day | ORAL | Status: DC
  Administered 2020-04-26 – 2020-04-29 (×4): 25 mg via ORAL
  Filled 2020-04-25 (×4): qty 1

## 2020-04-25 MED ORDER — EZETIMIBE 10 MG PO TABS
10.0000 mg | ORAL_TABLET | Freq: Every day | ORAL | Status: DC
  Administered 2020-04-26 – 2020-04-29 (×4): 10 mg via ORAL
  Filled 2020-04-25 (×4): qty 1

## 2020-04-25 MED ORDER — ACETAMINOPHEN 325 MG PO TABS
650.0000 mg | ORAL_TABLET | ORAL | Status: DC | PRN
  Administered 2020-04-26 – 2020-04-28 (×3): 650 mg via ORAL
  Filled 2020-04-25 (×3): qty 2

## 2020-04-25 MED ORDER — HEPARIN (PORCINE) 25000 UT/250ML-% IV SOLN
1050.0000 [IU]/h | INTRAVENOUS | Status: DC
  Administered 2020-04-26 – 2020-04-28 (×3): 1050 [IU]/h via INTRAVENOUS
  Filled 2020-04-25 (×3): qty 250

## 2020-04-25 MED ORDER — ATORVASTATIN CALCIUM 40 MG PO TABS
80.0000 mg | ORAL_TABLET | Freq: Every day | ORAL | Status: DC
  Administered 2020-04-26 – 2020-04-29 (×4): 80 mg via ORAL
  Filled 2020-04-25 (×4): qty 2

## 2020-04-25 MED ORDER — HEPARIN SODIUM (PORCINE) 5000 UNIT/ML IJ SOLN: 4000.0000 [IU] | Freq: Once | INTRAMUSCULAR | Status: DC

## 2020-04-25 MED ORDER — NITROGLYCERIN 0.4 MG SL SUBL: 0.4000 mg | SUBLINGUAL_TABLET | SUBLINGUAL | Status: DC | PRN

## 2020-04-25 MED ORDER — OXYCODONE HCL 5 MG PO TABS
5.0000 mg | ORAL_TABLET | ORAL | Status: DC | PRN
  Filled 2020-04-25: qty 1

## 2020-04-25 NOTE — ED Triage Notes (Addendum)
Per EMS, patient from home, had hip surgery on 8/10. AMS since last night. A&Ox2. Taking oxycodone. Decreased PO intake.   20g R hand NS with EMS

## 2020-04-25 NOTE — ED Notes (Signed)
Patient is in the bathroom. Staffed assisted x2

## 2020-04-25 NOTE — Progress Notes (Signed)
ANTICOAGULATION CONSULT NOTE - Initial Consult  Pharmacy Consult for Heparin Indication: chest pain/ACS  Allergies  Allergen Reactions  . Morphine Nausea Only    Patient Measurements: Height: 5\' 7"  (170.2 cm) Weight: 86.2 kg (190 lb) IBW/kg (Calculated) : 66.1 Heparin Dosing Weight:   Vital Signs: Temp: 98.7 F (37.1 C) (08/13 1902) Temp Source: Oral (08/13 1902) BP: 177/68 (08/13 2308) Pulse Rate: 101 (08/13 2308)  Labs: Recent Labs    04/23/20 0249 04/25/20 1846 04/25/20 1958 04/25/20 2148  HGB 11.1* 9.9*  --   --   HCT 32.7* 29.7*  --   --   PLT 213 213  --   --   APTT  --  30  --   --   LABPROT  --  12.7  --   --   INR  --  1.0  --   --   CREATININE 1.01 0.92  --   --   TROPONINIHS  --   --  111* 1,421*    Estimated Creatinine Clearance: 70.5 mL/min (by C-G formula based on SCr of 0.92 mg/dL).   Medical History: Past Medical History:  Diagnosis Date  . CAD (coronary artery disease)    s/p multiple PCIs to RCA dating back to 80. Last stent to proximal RCA in November of 2011. Also had 99% stenosis in the distal PDA with left to right collaterals and nonobstructive disease in the left coronary tree. ;  ISR of RCA 10/12 - CABG 10/9 with Dr. 12/9 Trigt: L-LAD, S-OM, S-PDA  . 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 (stable)  . HTN (hypertension)   . Hyperlipidemia     Medications:  (Not in a hospital admission)  Infusions:  . [START ON 04/26/2020] heparin      Assessment: Patient with ACS and + CE.  MD requests heparin per pharmacy for ACS.  No oral anticoagulants noted on med rec. Baseline coags WNL.   Goal of Therapy:  Heparin level 0.3-0.7 units/ml Monitor platelets by anticoagulation protocol: Yes   Plan:  Heparin bolus 4000 units iv x1 Heparin drip at 1050 units/hr Daily CBC Next heparin level at 0900    04/28/2020, Darlina Guys [place] 04/25/2020,11:58 PM

## 2020-04-25 NOTE — ED Notes (Signed)
MD notified critical lab troponin 1,421. Repeating EKG.

## 2020-04-25 NOTE — H&P (Signed)
History and Physical    [name] JOI:[id number] DOB: 04-01-1941 DOA: 04/25/2020  PCP: Gaspar Garbe, MD Consultants:   Cardiology  Patient coming from: Home   Chief Complaint: altered mental status   HPI: [name] is a 79 y.o. male with medical history significant of CAD status post CABG, postop day 3 right total hip arthroplasty, hypertension, hyperlipidemia, GERD, BPH  Presented to ED today via EMS, family was concerned for increased confusion/altered mental status.  Daughter is at bedside, describes over the course of a day and a half or so, patient becoming more confused, more unsteady.  She reports he has improved significantly and is almost back to normal but still seems a little bit more confused than usual.  Patient has no complaints other than back pain at this time.  Denies chest pain/shortness of breath.  ED Course: Troponins trended up from [phone].  EKG no concerning ST elevation.  Chest x-ray and CT head showed no concerns.  Slight drop in hemoglobin from 11.12 days ago to 9.9 now, increased white blood cells consistent with reactive process, no concerns on urinalysis or other labs.  Covid negative, blood cultures pending.    Review of Systems: As per HPI; otherwise review of systems reviewed and negative.   Ambulatory Status:  Ambulates with assistance  Past Medical History:  Diagnosis Date  . CAD (coronary artery disease)    s/p multiple PCIs to RCA dating back to 78. Last stent to proximal RCA in November of 2011. Also had 99% stenosis in the distal PDA with left to right collaterals and nonobstructive disease in the left coronary tree. ;  ISR of RCA 10/12 - CABG 10/9 with Dr. Zenaida Niece [name]: L-LAD, S-OM, S-PDA  . 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 (stable)  . HTN (hypertension)   . Hyperlipidemia     Past Surgical History:  Procedure Laterality Date  . CARDIAC CATHETERIZATION    . Coronary artery bypass grafting x4  (left internal mammary artery  06/23/2011  . CORONARY STENT PLACEMENT  Nov 2011   prox RCA. Has had prior multiple procedures.  [name] ANGIOGRAPHY N/A 05/04/2019   Procedure: CORONARY/GRAFT ANGIOGRAPHY;  Surgeon: [name] Patty, MD;  Location: [facility];  Service: Cardiovascular;  Laterality: N/A;  . JOINT REPLACEMENT    . left total hip replacement  2008  . TONSILLECTOMY    . TOOTH EXTRACTION  06/2013  . TOTAL HIP ARTHROPLASTY Right 04/22/2020   Procedure: TOTAL HIP ARTHROPLASTY;  Surgeon: Teryl Lucy, MD;  Location: [facility];  Service: Orthopedics;  Laterality: Right;    Social History   Socioeconomic History  . Marital status: Married    Spouse name: Not on file  . Number of children: Not on file  . Years of education: Not on file  . Highest education level: Not on file  Occupational History  . Not on file  Tobacco Use  . Smoking status: Never Smoker  . Smokeless tobacco: Never Used  Vaping Use  . Vaping Use: Never used  Substance and Sexual Activity  . Alcohol use: Yes    Alcohol/week: 6.0 standard drinks    Types: 6 Cans of beer per week    Comment: weekly  . Drug use: No  . Sexual activity: Not on file  Other Topics Concern  . Not on file  Social History Narrative  . Not on file   Social Determinants of Health   Financial Resource  Strain:   . Difficulty of Paying Living Expenses:   Food Insecurity:   . Worried About Programme researcher, broadcasting/film/videounning Out of Food in the Last Year:   . Baristaan Out of Food in the Last Year:   Transportation Needs:   . Freight forwarderLack of Transportation (Medical):   Marland Kitchen. Lack of Transportation (Non-Medical):   Physical Activity:   . Days of Exercise per Week:   . Minutes of Exercise per Session:   Stress:   . Feeling of Stress :   Social Connections:   . Frequency of Communication with Friends and Family:   . Frequency of Social Gatherings with Friends and Family:   . Attends Religious Services:   . Active Member of Clubs or Organizations:   .  Attends BankerClub or Organization Meetings:   Marland Kitchen. Marital Status:   Intimate Partner Violence:   . Fear of Current or Ex-Partner:   . Emotionally Abused:   Marland Kitchen. Physically Abused:   . Sexually Abused:     Allergies  Allergen Reactions  . Morphine Nausea Only    Family History  Problem Relation Age of Onset  . Heart disease Father   . Colon cancer Neg Hx   . Stomach cancer Neg Hx     Prior to Admission medications   Medication Sig Start Date End Date Taking? Authorizing Provider  amLODipine (NORVASC) 10 MG tablet TAKE 1 TABLET ONCE DAILY. Patient taking differently: Take 10 mg by mouth daily.  01/29/20   Bensimhon, Bevelyn Bucklesaniel R, MD  aspirin EC 325 MG tablet Take 1 tablet (325 mg total) by mouth 2 (two) times daily. 04/23/20   Janine OresBrown, Blaine K, PA-C  atorvastatin (LIPITOR) 80 MG tablet TAKE ONE TABLET BY MOUTH DAILY Patient taking differently: Take 80 mg by mouth daily.  12/03/19   Bensimhon, Bevelyn Bucklesaniel R, MD  baclofen (LIORESAL) 10 MG tablet Take 1 tablet (10 mg total) by mouth 3 (three) times daily. As needed for muscle spasm 04/23/20   Janine OresBrown, Blaine K, PA-C  ezetimibe (ZETIA) 10 MG tablet TAKE 1 TABLET BY MOUTH DAILY. Patient taking differently: Take 10 mg by mouth daily.  01/14/20   Bensimhon, Bevelyn Bucklesaniel R, MD  losartan (COZAAR) 100 MG tablet Take 1 tablet (100 mg total) by mouth daily. NEEDS APPT 04/14/20   Bensimhon, Bevelyn Bucklesaniel R, MD  nitroGLYCERIN (NITROSTAT) 0.4 MG SL tablet Place 0.4 mg under the tongue every 5 (five) minutes as needed for chest pain.    [provider]  Omega-3 Fatty Acids (FISH OIL) 1000 MG CAPS Take 2,000 mg by mouth daily.    [provider]  ondansetron (ZOFRAN) 4 MG tablet Take 1 tablet (4 mg total) by mouth every 8 (eight) hours as needed for nausea or vomiting. 04/23/20   Janine OresBrown, Blaine K, PA-C  oxyCODONE (ROXICODONE) 5 MG immediate release tablet Take 1 tablet (5 mg total) by mouth every 4 (four) hours as needed for severe pain. 04/23/20   Janine OresBrown, Blaine K, PA-C    pantoprazole (PROTONIX) 40 MG tablet TAKE 1 TABLET ONCE DAILY. Patient taking differently: Take 40 mg by mouth daily.  10/18/19   Bensimhon, Bevelyn Bucklesaniel R, MD  sennosides-docusate sodium (SENOKOT-S) 8.6-50 MG tablet Take 2 tablets by mouth daily. 04/23/20   Armida SansBrown, Blaine K, PA-C  spironolactone (ALDACTONE) 25 MG tablet TAKE (1/2) TABLET DAILY. Patient taking differently: Take 25 mg by mouth daily.  10/18/19   Bensimhon, Bevelyn Bucklesaniel R, MD  Tamsulosin HCl (FLOMAX) 0.4 MG CAPS Take 0.8 mg by mouth at bedtime.  [provider]    Physical Exam: Vitals:   04/25/20 2000 04/25/20 2019 04/25/20 2100 04/25/20 2308  BP: (!) 193/79  (!) 178/58 (!) 177/68  Pulse: 91 96 100 (!) 101  Resp: 19 18 (!) 23 17  Temp:      TempSrc:      SpO2: 100% 96% 98% 99%  Weight:      Height:         . General:  Appears calm and comfortable and is NAD . Eyes:  PERRL, EOMI, normal lids, iris . ENT:  grossly normal hearing, lips & tongue, mmm; appropriate dentition . Neck:  no LAD, masses or thyromegaly; no carotid bruits . Cardiovascular:  RRR, no m/r/g. No LE edema.  Marland Kitchen Respiratory:   CTA bilaterally with no wheezes/rales/rhonchi.  Normal respiratory effort. . Abdomen:  soft, NT, ND, NABS . Back:   normal alignment, no CVAT . Skin:  no rash or induration seen on limited exam . Musculoskeletal:  grossly normal tone BUE/BLE, good ROM, no bony abnormality . Lower extremity:  No LE edema.  Limited foot exam with no ulcerations.  2+ distal pulses. Marland Kitchen Psychiatric:  grossly normal mood and affect, speech fluent and appropriate, AOx3 . Neurologic:  CN 2-12 grossly intact, moves all extremities in coordinated fashion, sensation intact    Radiological Exams on Admission: CT Head Wo Contrast  Result Date: 04/25/2020 CLINICAL DATA:  Delirium EXAM: CT HEAD WITHOUT CONTRAST TECHNIQUE: Contiguous axial images were obtained from the base of the skull through the vertex without intravenous contrast. COMPARISON:  None.  FINDINGS: Brain: No acute territorial infarction, hemorrhage or intracranial mass. Mild atrophy. Mild hypodensity in the white matter consistent with chronic small vessel ischemic change. Nonenlarged ventricles. Vascular: No hyperdense vessels.  Carotid vascular calcifications. Skull: Normal. Negative for fracture or focal lesion. Sinuses/Orbits: No acute finding. Other: None IMPRESSION: 1. No CT evidence for acute intracranial abnormality. 2. Atrophy and mild chronic small vessel ischemic changes of the white matter. Electronically Signed   By: Jasmine Pang M.D.   On: 04/25/2020 20:40   DG Chest Port 1 View  Result Date: 04/25/2020 CLINICAL DATA:  Possible sepsis EXAM: PORTABLE CHEST 1 VIEW COMPARISON:  07/19/2011 FINDINGS: Post sternotomy changes. No focal opacity or pleural effusion. Aortic atherosclerosis. No pneumothorax. IMPRESSION: No active disease. Electronically Signed   By: Jasmine Pang M.D.   On: 04/25/2020 19:44    EKG: Independently reviewed. Most recent EKG 04/25/20 22:56 NSR with rate 93; nonspecific ST changes with no evidence of acute ischemia, no significant change from prior EKG 18:58   Labs on Admission: I have personally reviewed the available labs and imaging studies at the time of the admission.       Assessment/Plan Principal Problem:   NSTEMI (non-ST elevated myocardial infarction) (HCC) Active Problems:   Hyperlipidemia   HYPERTENSION, BENIGN   CAD, NATIVE VESSEL   Carotid stenosis   GERD (gastroesophageal reflux disease)   BPH (benign prostatic hyperplasia)   History of total hip arthroplasty 04/22/20  Cardiovascular: NSTEMI; Coronary artery disease, status post CABG, see coronary angiography 05/04/2019; hypertension; hyperlipidemia --> Spoke with cardiology fellow on-call, Dr Deforest Hoyles, recommended heparin gtt, ASA, statin, ACE/ARB, BB and ok to stay at [facility] if stable and cardiology can see in AM  --> Heparin gtt pharmacy to dose (spoke to ortho Dr.  Thurston Hole who noted that POD3 was ok to start this) --> continue statin, ARB --> add BB --> trend EKG --> cardiology to see pt  in the AM, appreciate further recs re: continue medical therapy vs repeat coronary angiography   S/p R Total Hip Arthroplasty --> continue pain control --> PT consult   Anemia likely postop but trending down --> AM CBC  Leukocytosis likely reactive      Note: This patient has been tested and is negative for the novel coronavirus COVID-19.  DVT prophylaxis: on heparin gtt  Code Status:  Full - confirmed with patient/family Family Communication: daughter at bedside  Disposition Plan: Home once clinically improved Consults called: Cardiology to see in AM, curbiside to ortho today can continue to involve them as needed, PT  Admission status: inpateitn     Sunnie Nielsen DO [facility]   How to contact the [facility] Attending or Consulting provider 7A - 7P or covering provider during after hours 7P -7A, for this patient?  1. Check the care team in [facility] and look for a) attending/consulting [facility] provider listed and b) the [facility] team listed 2. Log into [email or web address] and use [place]'s universal password to access. If you do not have the password, please contact the hospital operator. 3. Locate the [facility] provider you are looking for under [facility] and page to a number that you can be directly reached. 4. If you still have difficulty reaching the provider, please page the [facility] (Director on Call) for the Hospitalists listed on amion for assistance.   04/25/2020, 11:13 PM

## 2020-04-25 NOTE — ED Provider Notes (Addendum)
[facility]-EMERGENCY DEPT Provider Note   CSN: [id number] Arrival date & time: 04/25/20  1811     History No chief complaint on file.   [name] is a 79 y.o. male.  HPI He presents for evaluation of altered mental status by EMS.  He is at home, recovering from hip surgery, 3 days ago.  He states he has been able to walk with a walker.  He states he feels dizzy.  He is unable to give any additional history.  Level 5 caveat-altered mental status    Past Medical History:  Diagnosis Date  . CAD (coronary artery disease)    s/p multiple PCIs to RCA dating back to 67. Last stent to proximal RCA in November of 2011. Also had 99% stenosis in the distal PDA with left to right collaterals and nonobstructive disease in the left coronary tree. ;  ISR of RCA 10/12 - CABG 10/9 with Dr. Zenaida Niece Trigt: L-LAD, S-OM, S-PDA  . 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 (stable)  . HTN (hypertension)   . Hyperlipidemia     Patient Active Problem List   Diagnosis Date Noted  . NSTEMI (non-ST elevated myocardial infarction) (HCC) 04/25/2020  . GERD (gastroesophageal reflux disease) 04/25/2020  . BPH (benign prostatic hyperplasia) 04/25/2020  . History of total hip arthroplasty 04/22/20 04/25/2020  . Osteoarthritis of right hip 03/31/2020  . Carotid stenosis 11/06/2012  . Claudication of left lower extremity (HCC) 11/06/2012  . Hyperlipidemia 03/03/2009  . HYPERTENSION, BENIGN 03/03/2009  . CAD, NATIVE VESSEL 03/03/2009  . CAROTID BRUIT, RIGHT 03/03/2009    Past Surgical History:  Procedure Laterality Date  . CARDIAC CATHETERIZATION    . Coronary artery bypass grafting x4 (left internal mammary artery  06/23/2011  . CORONARY STENT PLACEMENT  Nov 2011   prox RCA. Has had prior multiple procedures.  Rosalie Doctor ANGIOGRAPHY N/A 05/04/2019   Procedure: CORONARY/GRAFT ANGIOGRAPHY;  Surgeon: Dolores Patty, MD;  Location: [facility] INVASIVE CV  LAB;  Service: Cardiovascular;  Laterality: N/A;  . JOINT REPLACEMENT    . left total hip replacement  2008  . TONSILLECTOMY    . TOOTH EXTRACTION  06/2013  . TOTAL HIP ARTHROPLASTY Right 04/22/2020   Procedure: TOTAL HIP ARTHROPLASTY;  Surgeon: Teryl Lucy, MD;  Location: [facility] ORS;  Service: Orthopedics;  Laterality: Right;       Family History  Problem Relation Age of Onset  . Heart disease Father   . Colon cancer Neg Hx   . Stomach cancer Neg Hx     Social History   Tobacco Use  . Smoking status: Never Smoker  . Smokeless tobacco: Never Used  Vaping Use  . Vaping Use: Never used  Substance Use Topics  . Alcohol use: Yes    Alcohol/week: 6.0 standard drinks    Types: 6 Cans of beer per week    Comment: weekly  . Drug use: No    Home Medications Prior to Admission medications   Medication Sig Start Date End Date Taking? Authorizing Provider  amLODipine (NORVASC) 10 MG tablet TAKE 1 TABLET ONCE DAILY. Patient taking differently: Take 10 mg by mouth daily.  01/29/20   Bensimhon, Bevelyn Buckles, MD  aspirin EC 325 MG tablet Take 1 tablet (325 mg total) by mouth 2 (two) times daily. 04/23/20   Janine Ores K, PA-C  atorvastatin (LIPITOR) 80 MG tablet TAKE ONE TABLET BY MOUTH DAILY Patient taking differently: Take 80 mg by mouth daily.  12/03/19   Bensimhon, Bevelyn Buckles, MD  baclofen (LIORESAL) 10 MG tablet Take 1 tablet (10 mg total) by mouth 3 (three) times daily. As needed for muscle spasm 04/23/20   Janine Ores K, PA-C  ezetimibe (ZETIA) 10 MG tablet TAKE 1 TABLET BY MOUTH DAILY. Patient taking differently: Take 10 mg by mouth daily.  01/14/20   Bensimhon, Bevelyn Buckles, MD  losartan (COZAAR) 100 MG tablet Take 1 tablet (100 mg total) by mouth daily. NEEDS APPT 04/14/20   Bensimhon, Bevelyn Buckles, MD  nitroGLYCERIN (NITROSTAT) 0.4 MG SL tablet Place 0.4 mg under the tongue every 5 (five) minutes as needed for chest pain.    [provider]  Omega-3 Fatty Acids (FISH OIL) 1000 MG CAPS  Take 2,000 mg by mouth daily.    [provider]  ondansetron (ZOFRAN) 4 MG tablet Take 1 tablet (4 mg total) by mouth every 8 (eight) hours as needed for nausea or vomiting. 04/23/20   Janine Ores K, PA-C  oxyCODONE (ROXICODONE) 5 MG immediate release tablet Take 1 tablet (5 mg total) by mouth every 4 (four) hours as needed for severe pain. 04/23/20   Janine Ores K, PA-C  pantoprazole (PROTONIX) 40 MG tablet TAKE 1 TABLET ONCE DAILY. Patient taking differently: Take 40 mg by mouth daily.  10/18/19   Bensimhon, Bevelyn Buckles, MD  sennosides-docusate sodium (SENOKOT-S) 8.6-50 MG tablet Take 2 tablets by mouth daily. 04/23/20   Armida Sans, PA-C  spironolactone (ALDACTONE) 25 MG tablet TAKE (1/2) TABLET DAILY. Patient taking differently: Take 25 mg by mouth daily.  10/18/19   Bensimhon, Bevelyn Buckles, MD  Tamsulosin HCl (FLOMAX) 0.4 MG CAPS Take 0.8 mg by mouth at bedtime.     [provider]    Allergies    Morphine  Review of Systems   Review of Systems  Unable to perform ROS: Mental status change    Physical Exam Updated Vital Signs BP (!) 177/68   Pulse (!) 101   Temp 98.7 F (37.1 C) (Oral)   Resp 17   Ht  (1.702 m)   Wt 86.2 kg   SpO2 99%   BMI 29.76 kg/m   Physical Exam Vitals and nursing note reviewed.  Constitutional:      General: He is in acute distress.     Appearance: He is well-developed. He is ill-appearing. He is not toxic-appearing or diaphoretic.  HENT:     Head: Normocephalic and atraumatic.     Right Ear: External ear normal.     Left Ear: External ear normal.  Eyes:     Conjunctiva/sclera: Conjunctivae normal.     Pupils: Pupils are equal, round, and reactive to light.  Neck:     Trachea: Phonation normal.  Cardiovascular:     Rate and Rhythm: Normal rate and regular rhythm.     Heart sounds: Normal heart sounds.  Pulmonary:     Effort: Pulmonary effort is normal. No respiratory distress.     Breath sounds: Normal breath sounds. No  stridor.  Chest:     Chest wall: No tenderness.  Abdominal:     General: There is no distension.     Palpations: Abdomen is soft.     Tenderness: There is no abdominal tenderness.  Musculoskeletal:     Cervical back: Normal range of motion and neck supple.     Comments: He guards against motion of the right hip secondary to pain.  There is a surgical wound, covered with surgical dressing,  right lateral hip region.  There is mild to moderate generalized right hip swelling consistent with prior surgery associated with ecchymosis of the groin region.  Neurovascular intact distally in the right foot.  Skin:    General: Skin is warm and dry.  Neurological:     Mental Status: He is alert.     Cranial Nerves: No cranial nerve deficit.     Sensory: No sensory deficit.     Motor: No abnormal muscle tone.     Coordination: Coordination normal.     Comments: No dysarthria or aphasia.  Psychiatric:        Mood and Affect: Mood normal.        Behavior: Behavior normal.     ED Results / Procedures / Treatments   Labs (all labs ordered are listed, but only abnormal results are displayed) Labs Reviewed  COMPREHENSIVE METABOLIC PANEL - Abnormal; Notable for the following components:      Result Value   Sodium 131 (*)    CO2 20 (*)    Glucose, Bld 125 (*)    Calcium 8.6 (*)    Albumin 3.3 (*)    Total Bilirubin 1.6 (*)    All other components within normal limits  CBC WITH DIFFERENTIAL/PLATELET - Abnormal; Notable for the following components:   WBC 15.4 (*)    RBC 3.05 (*)    Hemoglobin 9.9 (*)    HCT 29.7 (*)    Neutro Abs 12.3 (*)    Monocytes Absolute 1.6 (*)    All other components within normal limits  URINALYSIS, ROUTINE W REFLEX MICROSCOPIC - Abnormal; Notable for the following components:   Hgb urine dipstick TRACE (*)    All other components within normal limits  CBG MONITORING, ED - Abnormal; Notable for the following components:   Glucose-Capillary 121 (*)    All other  components within normal limits  TROPONIN I (HIGH SENSITIVITY) - Abnormal; Notable for the following components:   Troponin I (High Sensitivity) 111 (*)    All other components within normal limits  TROPONIN I (HIGH SENSITIVITY) - Abnormal; Notable for the following components:   Troponin I (High Sensitivity) 1,421 (*)    All other components within normal limits  SARS CORONAVIRUS 2 BY RT PCR (HOSPITAL ORDER, PERFORMED IN [facility])  URINE CULTURE  CULTURE, BLOOD (ROUTINE X 2)  CULTURE, BLOOD (ROUTINE X 2)  LACTIC ACID, PLASMA  PROTIME-INR  APTT  URINALYSIS, MICROSCOPIC (REFLEX)    EKG EKG Interpretation  Date/Time:  Friday April 25 2020 18:58:23 EDT Ventricular Rate:  94 PR Interval:    QRS Duration: 104 QT Interval:  352 QTC Calculation: 441 R Axis:   93 Text Interpretation: Sinus rhythm Paired ventricular premature complexes Aberrant conduction of SV complex(es) Left atrial enlargement Right axis deviation Minimal ST depression, lateral leads Since last tracing pvc new, and nonspecific lateral ST depression Otherwise no significant change Confirmed by Mancel Bale [phone]) on 04/25/2020 7:10:53 PM   EKG Interpretation  Date/Time:  Friday April 25 2020 22:56:31 EDT Ventricular Rate:  93 PR Interval:    QRS Duration: 102 QT Interval:  372 QTC Calculation: 463 R Axis:   85 Text Interpretation: Sinus rhythm Left atrial enlargement Borderline right axis deviation Since last tracing of earlier today No significant change since Confirmed by Mancel Bale [phone]) on 04/25/2020 11:09:58 PM         Radiology CT Head Wo Contrast  Result Date: 04/25/2020 CLINICAL DATA:  Delirium EXAM: CT  HEAD WITHOUT CONTRAST TECHNIQUE: Contiguous axial images were obtained from the base of the skull through the vertex without intravenous contrast. COMPARISON:  None. FINDINGS: Brain: No acute territorial infarction, hemorrhage or intracranial mass. Mild atrophy. Mild  hypodensity in the white matter consistent with chronic small vessel ischemic change. Nonenlarged ventricles. Vascular: No hyperdense vessels.  Carotid vascular calcifications. Skull: Normal. Negative for fracture or focal lesion. Sinuses/Orbits: No acute finding. Other: None IMPRESSION: 1. No CT evidence for acute intracranial abnormality. 2. Atrophy and mild chronic small vessel ischemic changes of the white matter. Electronically Signed   By: Jasmine Pang M.D.   On: 04/25/2020 20:40   DG Chest Port 1 View  Result Date: 04/25/2020 CLINICAL DATA:  Possible sepsis EXAM: PORTABLE CHEST 1 VIEW COMPARISON:  07/19/2011 FINDINGS: Post sternotomy changes. No focal opacity or pleural effusion. Aortic atherosclerosis. No pneumothorax. IMPRESSION: No active disease. Electronically Signed   By: Jasmine Pang M.D.   On: 04/25/2020 19:44    Procedures .Critical Care Performed by: Mancel Bale, MD Authorized by: Mancel Bale, MD   Critical care provider statement:    Critical care time (minutes):  35   Critical care start time:  04/25/2020 6:35 PM   Critical care end time:  04/25/2020 11:13 PM   Critical care time was exclusive of:  Separately billable procedures and treating other patients   Critical care was necessary to treat or prevent imminent or life-threatening deterioration of the following conditions:  Cardiac failure   Critical care was time spent personally by me on the following activities:  Blood draw for specimens, development of treatment plan with patient or surrogate, discussions with consultants, evaluation of patient's response to treatment, examination of patient, obtaining history from patient or surrogate, ordering and performing treatments and interventions, ordering and review of laboratory studies, pulse oximetry, re-evaluation of patient's condition, review of old charts and ordering and review of radiographic studies   (including critical care time)  Medications Ordered in  ED Medications  aspirin chewable tablet 324 mg (has no administration in time range)  nitroGLYCERIN (NITROSTAT) SL tablet 0.4 mg (has no administration in time range)  lactated ringers bolus 1,000 mL (1,000 mLs Intravenous New Bag/Given 04/25/20 2022)    ED Course  I have reviewed the triage vital signs and the nursing notes.  Pertinent labs & imaging results that were available during my care of the patient were reviewed by me and considered in my medical decision making (see chart for details).  Clinical Course as of Apr 26 2331  Fri Apr 25, 2020  2026 Normal  Lactic acid, plasma [EW]  2026 Normal  Protime-INR [EW]  2027 Normal except elevated white count, hemoglobin low  CBC WITH DIFFERENTIAL(!) [EW]  2027 Normal except sodium low, CO2 low, glucose high, calcium low, albumin low, total protein high  Comprehensive metabolic panel(!) [EW]  2027 Elevated  CBG monitoring, ED(!) [EW]  2056 Normal  Urinalysis, Routine w reflex microscopic(!) [EW]  2101 No infiltrate or edema, interpreted by me  DG Chest Port 1 View [EW]  2111 Abnormal, high  Troponin I (High Sensitivity)(!!) [EW]  2114 CT Head Wo Contrast [EW]  2132 I had an extensive conversation with the wife.  She states that he came home from the hospital, 3 days ago and was doing well at that time able to eat and manage his self, including doing some exercise.  Since then he has become gradually more confused, and gradually weaker and more sleepy.  She thinks he  has taken about 8 oxycodone tablets, in the last 3 days.  Today he was unable to eat much at all.  She is concerned that he is dehydrated because he usually does not drink much fluid.   [EW]  2248 Normal  SARS Coronavirus 2 by RT PCR (hospital order, performed in [facility]) Nasopharyngeal [EW]  2252 Marked elevation, compared to prior  Troponin I (High Sensitivity)(!!) [EW]  2300 Case discussed with cardiology, increasing troponin, with unchanged EKG.  He  suspects that this represents demand ischemia.  He will see the patient as a Research scientist (medical)consultant requested by the hospitalist service, after [facility].   [EW]  2308 Albumin(!): 3.3 [EW]  2330 I have additionally discussed with the patient, and his daughter in the room. Currently the patient states he has "indigestion." He states that he was cleared by cardiology prior to surgery. He has a history of coronary artery disease, angioplasties and coronary artery bypass grafting. Aspirin nitroglycerin ordered.   [EW]    Clinical Course User Index [EW] Mancel BaleWentz, Nuvia Hileman, MD   MDM Rules/Calculators/A&P                           Patient Vitals for the past 24 hrs:  BP Temp Temp src Pulse Resp SpO2 Height Weight  04/25/20 2308 (!) 177/68 -- -- (!) 101 17 99 % -- --  04/25/20 2100 (!) 178/58 -- -- 100 (!) 23 98 % -- --  04/25/20 2019 -- -- -- 96 18 96 % -- --  04/25/20 2000 (!) 193/79 -- -- 91 19 100 % -- --  04/25/20 1949 -- -- -- -- -- -- 5\' 7"  (1.702 m) 86.2 kg  04/25/20 1902 -- 98.7 F (37.1 C) Oral -- -- -- -- --  04/25/20 1848 (!) 193/68 99.2 F (37.3 C) Rectal 100 20 98 % -- --  04/25/20 1837 -- -- -- 100 18 98 % -- --  04/25/20 1829 -- -- -- -- -- 98 % 5\' 7"  (1.702 m) 86.2 kg    11:03 PM Reevaluation with update and discussion. After initial assessment and treatment, an updated evaluation reveals he continues to be alert, and comfortable.Mancel Bale. Obie Silos   Medical Decision Making:  This patient is presenting for evaluation of weakness, and confusion, which does require a range of treatment options, and is a complaint that involves a high risk of morbidity and mortality. The differential diagnoses include infection, surgical complication, metabolic instability, CVA. I decided to review old records, and in summary elderly male who underwent right hip replacement for osteoarthritis, 3 days ago.  Presenting with worsening confusion..  I did obtain additional historical information  from his wife, by phone and his daughter at the bedside.  Clinical Laboratory Tests Ordered, included CBC, Metabolic panel, Urinalysis and Covid test, troponin. Review indicates urinalysis normal.  White count high, hemoglobin low.  Sodium low, CO2 low, glucose high, calcium low, albumin low, total bilirubin high. Radiologic Tests Ordered, included CT head, chest x-ray.  I independently Visualized: Radiographic images, which show no sign of stroke, pneumonia or pulmonary edema.  Cardiac Monitor Tracing which shows normal sinus rhythm  Critical Interventions-clinical evaluation, laboratory testing, CT head, chest x-ray, delta troponin, observation and reassessment  After These Interventions, the Patient was reevaluated and was found no clear cause for confusion weakness.  Incidental troponin elevation with EKG that does not show infarct.  Initial EKG did show some nonspecific ST  depression, repeat EKG actually look better.  Possible demand ischemia versus NSTEMI.  Patient requires hospitalization and serial testing.  CRITICAL CARE-yes Performed by: Mancel Bale  Nursing Notes Reviewed/ Care Coordinated Applicable Imaging Reviewed Interpretation of Laboratory Data incorporated into ED treatment  11:05 AM- Case discussed with hospitalist to arrange admission.    Final Clinical Impression(s) / ED Diagnoses Final diagnoses:  NSTEMI (non-ST elevated myocardial infarction) (HCC)  Confusion  Weakness    Rx / DC Orders ED Discharge Orders    None       Mancel Bale, MD 04/25/20 2314    Mancel Bale, MD 04/25/20 2316    Mancel Bale, MD 04/25/20 2332

## 2020-04-25 NOTE — ED Notes (Signed)
Date and time results received: 04/25/20 9:08 PM  Test: Troponin Critical Value: 111  Name of Provider Notified: Effie Shy MD  Orders Received? Or Actions Taken?: Actions Taken: MD made aware

## 2020-04-25 NOTE — ED Notes (Signed)
Date and time results received: 04/25/20 2253 (use smartphrase ".now" to insert current time)  Test: troponin Critical Value: 1421  Name of Provider Notified: Effie Shy, md  Orders Received? Or Actions Taken?: Orders Received - See Orders for details

## 2020-04-26 ENCOUNTER — Other Ambulatory Visit: Payer: Self-pay

## 2020-04-26 ENCOUNTER — Inpatient Hospital Stay (HOSPITAL_COMMUNITY): Payer: Medicare HMO

## 2020-04-26 DIAGNOSIS — I214 Non-ST elevation (NSTEMI) myocardial infarction: Secondary | ICD-10-CM

## 2020-04-26 LAB — BASIC METABOLIC PANEL
Anion gap: 7 (ref 5–15)
BUN: 17 mg/dL (ref 8–23)
CO2: 21 mmol/L — ABNORMAL LOW (ref 22–32)
Calcium: 8.4 mg/dL — ABNORMAL LOW (ref 8.9–10.3)
Chloride: 101 mmol/L (ref 98–111)
Creatinine, Ser: 0.85 mg/dL (ref 0.61–1.24)
GFR calc Af Amer: >60 mL/min (ref >60)
GFR calc non Af Amer: >60 mL/min (ref >60)
Glucose, Bld: 101 mg/dL — ABNORMAL HIGH (ref 70–99)
Potassium: 4 mmol/L (ref 3.5–5.1)
Sodium: 129 mmol/L — ABNORMAL LOW (ref 135–145)

## 2020-04-26 LAB — CBC
HCT: 28.6 % — ABNORMAL LOW (ref 39.0–52.0)
Hemoglobin: 9.7 g/dL — ABNORMAL LOW (ref 13.0–17.0)
MCH: 33 pg (ref 26.0–34.0)
MCHC: 33.9 g/dL (ref 30.0–36.0)
MCV: 97.3 fL (ref 80.0–100.0)
Platelets: 219 10*3/uL (ref 150–400)
RBC: 2.94 MIL/uL — ABNORMAL LOW (ref 4.22–5.81)
RDW: 13 % (ref 11.5–15.5)
WBC: 12.5 10*3/uL — ABNORMAL HIGH (ref 4.0–10.5)
nRBC: 0 % (ref 0.0–0.2)

## 2020-04-26 LAB — LIPID PANEL
Cholesterol: 120 mg/dL (ref 0–200)
HDL: 42 mg/dL (ref >40)
LDL Cholesterol: 66 mg/dL (ref 0–99)
Total CHOL/HDL Ratio: 2.9 RATIO
Triglycerides: 58 mg/dL (ref <150)
VLDL: 12 mg/dL (ref 0–40)

## 2020-04-26 LAB — ECHOCARDIOGRAM COMPLETE
Area-P 1/2: 5.88 cm2
Height: 67 in
S' Lateral: 3.9 cm
Weight: 3040 oz

## 2020-04-26 LAB — PROTIME-INR
INR: 1 (ref 0.8–1.2)
Prothrombin Time: 12.8 seconds (ref 11.4–15.2)

## 2020-04-26 LAB — TROPONIN I (HIGH SENSITIVITY): Troponin I (High Sensitivity): 17390 ng/L (ref <18)

## 2020-04-26 LAB — HEPARIN LEVEL (UNFRACTIONATED)
Heparin Unfractionated: 0.51 IU/mL (ref 0.30–0.70)
Heparin Unfractionated: 0.36 IU/mL (ref 0.30–0.70)

## 2020-04-26 LAB — BRAIN NATRIURETIC PEPTIDE: B Natriuretic Peptide: 208.4 pg/mL — ABNORMAL HIGH (ref 0.0–100.0)

## 2020-04-26 MED ORDER — POLYETHYLENE GLYCOL 3350 17 G PO PACK
17.0000 g | PACK | Freq: Every day | ORAL | Status: DC
  Administered 2020-04-26 – 2020-04-29 (×3): 17 g via ORAL
  Filled 2020-04-26 (×3): qty 1

## 2020-04-26 MED ORDER — METOPROLOL SUCCINATE ER 50 MG PO TB24: 25.0000 mg | ORAL_TABLET | Freq: Every day | ORAL | Status: DC

## 2020-04-26 MED ORDER — SENNOSIDES-DOCUSATE SODIUM 8.6-50 MG PO TABS
1.0000 | ORAL_TABLET | Freq: Every day | ORAL | Status: DC
  Administered 2020-04-26 – 2020-04-28 (×3): 1 via ORAL
  Filled 2020-04-26 (×3): qty 1

## 2020-04-26 MED ORDER — METOPROLOL TARTRATE 25 MG PO TABS: 50.0000 mg | ORAL_TABLET | Freq: Two times a day (BID) | ORAL | Status: DC

## 2020-04-26 MED ORDER — METOPROLOL TARTRATE 25 MG PO TABS
25.0000 mg | ORAL_TABLET | Freq: Three times a day (TID) | ORAL | Status: DC
  Administered 2020-04-26 – 2020-04-27 (×3): 25 mg via ORAL
  Filled 2020-04-26 (×3): qty 1

## 2020-04-26 NOTE — Progress Notes (Signed)
  Echocardiogram 2D Echocardiogram has been performed.  Leta Jungling M 04/26/2020, 9:24 AM

## 2020-04-26 NOTE — Progress Notes (Signed)
Pharmacy: [facility]  Patient's a 79 y.o currently on heparin drip for NSTEMI.  - heparin level is therapeutic at 0.36 (goal 0.3-0.7)  Plan: - continue heparin drip at 1050 units/hr - daily heparin level - monitor for s/sx bleeding   Dorna Leitz, PharmD, BCPS 04/26/2020 7:17 PM

## 2020-04-26 NOTE — Progress Notes (Signed)
ANTICOAGULATION CONSULT NOTE  Pharmacy Consult for Heparin Indication: chest pain/ACS  Allergies  Allergen Reactions  . Morphine Nausea Only    Patient Measurements: Height: 5\' 7"  (170.2 cm) Weight: 86.2 kg (190 lb) IBW/kg (Calculated) : 66.1 Heparin Dosing Weight: 83.7 kg  Vital Signs: Temp: 98.7 F (37.1 C) (08/14 1222) Temp Source: Oral (08/14 1222) BP: 145/61 (08/14 1222) Pulse Rate: 67 (08/14 1222)  Labs: Recent Labs    04/25/20 1846 04/25/20 1958 04/25/20 2148 04/26/20 0442 04/26/20 1132  HGB 9.9*  --   --  9.7*  --   HCT 29.7*  --   --  28.6*  --   PLT 213  --   --  219  --   APTT 30  --   --   --   --   LABPROT 12.7  --   --  12.8  --   INR 1.0  --   --  1.0  --   HEPARINUNFRC  --   --   --   --  0.51  CREATININE 0.92  --   --  0.85  --   TROPONINIHS  --  111* 1,421*  --  17,390*    Estimated Creatinine Clearance: 76.3 mL/min (by C-G formula based on SCr of 0.85 mg/dL).  Assessment: Patient with ACS and + CE.  MD requests heparin per pharmacy for ACS.  No oral anticoagulants noted on med rec. Baseline coags WNL.  04/26/2020 First heparin level therapeutic at 0.51 after 4000 unit bolus and drip at 1050 units/hr CBC stable, no bleeding reported.   Non-STEMI w/ elevated troponins.  8/14 echo: EF 40-45 %  Goal of Therapy:  Heparin level 0.3-0.7 units/ml Monitor platelets by anticoagulation protocol: Yes   Plan:  Continue Heparin drip at 1050 units/hr 8 hr confirmatory heparin level at 1930 Daily CBC & heparin level  9/14, Pharm.D 04/26/2020 12:58 PM

## 2020-04-26 NOTE — Progress Notes (Signed)
PT Cancellation Note  Patient Details Name: [name] MRN: [id number] DOB: 1941-08-14   Cancelled Treatment:    Reason Eval/Treat Not Completed: Patient not medically ready. Will check back tomorrow.   Rada Hay 04/26/2020, 1:28 PM Blanchard Kelch PT Acute Rehabilitation Services Pager [phone] Office [phone]

## 2020-04-26 NOTE — Progress Notes (Signed)
PROGRESS NOTE    [name]  BZJ:[id number] DOB: [birth year] DOA: 04/25/2020 PCP: Gaspar Garbe, MD  Brief Narrative:  41 white male Prior CAD status post multiple PCI in 1983-CABG X4 06/18/2011-last cardiac cath 05/04/2019 showing revascularization and noncritical stenosis and anatomy precluding PCI at that time Carotid bruit with last check 01/2017 R ICA 40-59% LICA 1-39% HTN HLD Right inguinal hernia successfully operated on 03/07/2020 Dr. Luisa Hart Hip arthroplasty 04/22/2020 Dr. 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 NSTEMI in the setting of cath 2020 showing no targets a. Defer further planning to cardiology with possibility of cardiac cath?  Target suspect might just be medical management b. Troponin is peaked to 17,390 EKG per my over read this morning did not really show any ST-T wave changes c. Current cardiac meds loaded with aspirin, metoprolol 25 every 8, Aldactone 25, losartan 100 d. Is on IV heparin at least for 48 hours e. Echo does not show wall motion abnormality although slight decrease in EF 2. Toxic metabolic encephalopathy on admission a. Wife relates medications were "completely changed" when patient went home from recent surgery\ b. On admission was discontinued off of baclofen 10 c. It is unlikely he has an infection as he has no dysuria but we are checking urine analysis and urine culture d. It  is totally resolved at this time 3. Hip arthroplasty 04/22/2020 Dr. Dion Saucier a. I will CC his orthopedic surgeon on his recent readmission b. He will require aspirin Plavix on discharge because of NSTEMI so this may alter his postop anticoagulation plan ASA 325 twice daily initiated by them 4. Hyponatremia possible SIADH or secondary to tea toast potomania a. Monitor trends b. Suspect will improve with diet consolidated in diet c.  if worsens would work-up with urine studies and slight volume restriction 5. Leukocytosis a. See discussion #2 6. Hypertension a. Continuing amlodipine in addition to above medications b. Adjust meds as needed 7. Reflux 8. BPH a. Continue Flomax 0.4 9. HLD a. Goal LDL below 70 given risk factors-continue with high intensity atorvastatin 80, Zetia 10 and omega-3 fatty acids can be resumed b.  10.   DVT prophylaxis: On IV heparin at this time Code Status: Full code Family Communication: Discussed with wife in great detail at the bedside  Disposition:   Status is: Inpatient  Remains inpatient appropriate because:Hemodynamically unstable, Persistent severe electrolyte disturbances, Unsafe d/c plan and IV treatments appropriate due to intensity of illness or inability to take PO   Dispo: The patient is from: Home              Anticipated d/c is to: Home              Anticipated d/c date is: > 3 days              Patient currently is not medically stable to d/c.       Consultants:   Cardiology  Procedures:  Echocardiogram performed  8/14 IMPRESSIONS    1. Left ventricular ejection fraction, by estimation, is 40 to 45%. The  left ventricle has mildly decreased function. The left ventricle  demonstrates regional wall motion abnormalities (see scoring  diagram/findings for description). There is mild  concentric left ventricular hypertrophy. Left ventricular diastolic  parameters were normal.  2. Right ventricular systolic function is normal.  The right ventricular  size is normal. There is normal pulmonary artery systolic pressure. The  estimated right ventricular systolic pressure is 32.8 mmHg.  3. The mitral valve is grossly normal. No evidence of mitral valve  regurgitation. No evidence of mitral stenosis.  4. The aortic valve is tricuspid. Aortic valve regurgitation is not  visualized. Mild to moderate aortic valve sclerosis/calcification is  present, without any evidence of aortic stenosis.  5. The inferior vena cava is normal in size with greater than 50%  respiratory variability, suggesting right atrial pressure of 3 mmHg.   Antimicrobials: None   Subjective: Awake coherent no distress seen at the bedside with his wife Absolutely no chest pain no fever no chills No dysuria no cough no cold He is completely oriented to place person time and recalls past events with good recollection  Objective: Vitals:   04/25/20 2308 04/26/20 0055 04/26/20 0300 04/26/20 0439  BP: (!) 177/68 (!) 178/66 (!) 131/46 (!) 184/52  Pulse: (!) 101 81 (!) 54 85  Resp: 17 18 15 19   Temp:    99.1 F (37.3 C)  TempSrc:    Oral  SpO2: 99% 96% 94% 95%  Weight:      Height:        Intake/Output Summary (Last 24 hours) at 04/26/2020 0813 Last data filed at 04/25/2020 2200 Gross per 24 hour  Intake 1000 ml  Output --  Net 1000 ml   Filed Weights   04/25/20 1829 04/25/20 1949  Weight: 86.2 kg 86.2 kg    Examination:  General exam: EOMI NCAT looks about stated age, no icterus no pallor neck soft supple Respiratory system: Clinically clear no rales no rhonchi Cardiovascular system: S1-S2 no murmur rub or gallop Gastrointestinal system: Soft nontender no rebound no guarding no lower quadrant pain. Central nervous system: Neurologically intact no focal deficit Extremities: ROM intact Skin: No lower extremity edema Psychiatry: Euthymic and congruent  Data Reviewed: I have personally reviewed following labs and imaging  studies Sodium baseline 138 on 04/15/2020 currently 129 BUNs/creatinine at baseline 17/0.8 HDL 42 LDL 66 total 120 BNP 208 troponin I 421 Hemoglobin 9.7 baseline 11-12, white count 12.5 down from 15 on admission Radiology Studies: CT Head Wo Contrast  Result Date: 04/25/2020 CLINICAL DATA:  Delirium EXAM: CT HEAD WITHOUT CONTRAST TECHNIQUE: Contiguous axial images were obtained from the base of the skull through the vertex without intravenous contrast. COMPARISON:  None. FINDINGS: Brain: No acute territorial infarction, hemorrhage or intracranial mass. Mild atrophy. Mild hypodensity in the white matter consistent with chronic small vessel ischemic change. Nonenlarged ventricles. Vascular: No hyperdense vessels.  Carotid vascular calcifications. Skull: Normal. Negative for fracture or focal lesion. Sinuses/Orbits: No acute finding. Other: None IMPRESSION: 1. No CT evidence for acute intracranial abnormality. 2. Atrophy and mild chronic small vessel ischemic changes of the white matter. Electronically Signed   By: 04/27/2020 M.D.   On: 04/25/2020 20:40   DG Chest Port 1 View  Result Date: 04/25/2020 CLINICAL DATA:  Possible sepsis EXAM: PORTABLE CHEST 1 VIEW COMPARISON:  07/19/2011 FINDINGS: Post sternotomy changes. No focal opacity or pleural effusion. Aortic  atherosclerosis. No pneumothorax. IMPRESSION: No active disease. Electronically Signed   By: Jasmine Pang M.D.   On: 04/25/2020 19:44     Scheduled Meds: . amLODipine  10 mg Oral Daily  . aspirin  324 mg Oral NOW   Or  . aspirin  300 mg Rectal NOW  . aspirin EC  81 mg Oral Daily  . atorvastatin  80 mg Oral Daily  . ezetimibe  10 mg Oral Daily  . losartan  100 mg Oral Daily  . [START ON 04/27/2020] metoprolol succinate  25 mg Oral Daily  . omega-3 acid ethyl esters  2,000 mg Oral Daily  . pantoprazole  40 mg Oral Daily  . spironolactone  25 mg Oral Daily  . tamsulosin  0.8 mg Oral QHS   Continuous Infusions: . heparin 1,050  Units/hr (04/26/20 0117)     LOS: 1 day    Time spent: 51  Rhetta Mura, MD [facility] To contact the attending provider between 7A-7P or the covering provider during after hours 7P-7A, please log into the web site [email or web address] and access using universal  password for that web site. If you do not have the password, please call the hospital operator.  04/26/2020, 8:13 AM

## 2020-04-26 NOTE — Consult Note (Signed)
Cardiology Consultation:   Patient ID: [name] MRN: [id number]; DOB: [birth year]  Admit date: 04/25/2020 Date of Consult: 04/26/2020  Primary Care Provider: Gaspar Garbe, MD [facility] Cardiologist:  DB last seen 9/20 [facility] Electrophysiologist:  None     Patient Profile:   [name] is a 79 y.o. male with a hx of coronary artery disease and prior bypass surgery who is being seen today for the evaluation of chest pain, positive troponin (111->>1421) 4 days status post hip arthroplasty associated with new anemia at the request of Dr. Lyn Hollingshead.  He had presented to the emergency room with confusion  Started on heparin and beta-blockers   History of Present Illness:   [name] underwent hip arthroplasty earlier this week and presented yesterday with changes in mental status and weakness.  Enzymes elevated as above.  Thought to be dehydrated.  Currently pain-free.  No dyspnea.  Mental status back to normal.  Chest pain was different from prior chest pains. 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 CAD (coronary artery disease)    s/p multiple PCIs to RCA dating back to 85. Last stent to proximal RCA in November of 2011. Also had 99% stenosis in the distal PDA with left to right collaterals and nonobstructive disease in the left coronary tree. ;  ISR of RCA 10/12 - CABG 10/9 with Dr. Zenaida Niece Trigt: L-LAD, S-OM, S-PDA  . 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 (stable)  . HTN (hypertension)   . Hyperlipidemia     Past Surgical History:  Procedure Laterality Date  . CARDIAC CATHETERIZATION    . Coronary artery bypass  grafting x4 (left internal mammary artery  06/23/2011  . CORONARY STENT PLACEMENT  Nov 2011   prox RCA. Has had prior multiple procedures.  Rosalie Doctor ANGIOGRAPHY N/A 05/04/2019   Procedure: CORONARY/GRAFT ANGIOGRAPHY;  Surgeon: Dolores Patty, MD;  Location: [facility] INVASIVE CV LAB;  Service: Cardiovascular;  Laterality: N/A;  . JOINT REPLACEMENT    . left total hip replacement  2008  . TONSILLECTOMY    . TOOTH EXTRACTION  06/2013  . TOTAL HIP ARTHROPLASTY Right 04/22/2020   Procedure: TOTAL HIP ARTHROPLASTY;  Surgeon: Teryl Lucy, MD;  Location: [facility] ORS;  Service: Orthopedics;  Laterality: Right;    No current facility-administered medications on file prior to encounter.   Current Outpatient Medications on File Prior to Encounter  Medication Sig Dispense Refill  . amLODipine (NORVASC) 10 MG tablet TAKE 1 TABLET ONCE DAILY. (Patient taking differently: Take 10 mg by mouth daily. ) 90 tablet 3  . aspirin EC 325 MG tablet Take 1 tablet (325 mg total) by mouth 2 (two) times daily. 60 tablet 0  . atorvastatin (LIPITOR) 80 MG tablet TAKE ONE TABLET BY MOUTH DAILY (Patient taking differently: Take 80 mg by mouth daily. ) 90 tablet 3  . baclofen (LIORESAL) 10 MG tablet Take 1 tablet (10 mg total) by mouth 3 (three) times daily. As needed for muscle spasm 50 tablet 0  . ezetimibe (ZETIA) 10 MG tablet TAKE 1 TABLET BY MOUTH DAILY. (  Patient taking differently: Take 10 mg by mouth daily. ) 90 tablet 3  . losartan (COZAAR) 100 MG tablet Take 1 tablet (100 mg total) by mouth daily. NEEDS APPT 90 tablet 0  . Omega-3 Fatty Acids (FISH OIL) 1000 MG CAPS Take 2,000 mg by mouth daily.    . ondansetron (ZOFRAN) 4 MG tablet Take 1 tablet (4 mg total) by mouth every 8 (eight) hours as needed for nausea or vomiting. 10 tablet 0  . oxyCODONE (ROXICODONE) 5 MG immediate release tablet Take 1 tablet (5 mg total) by mouth every 4 (four) hours as needed for severe pain. 30 tablet 0  . pantoprazole (PROTONIX) 40  MG tablet TAKE 1 TABLET ONCE DAILY. (Patient taking differently: Take 40 mg by mouth daily. ) 90 tablet 3  . sennosides-docusate sodium (SENOKOT-S) 8.6-50 MG tablet Take 2 tablets by mouth daily. 30 tablet 1  . spironolactone (ALDACTONE) 25 MG tablet TAKE (1/2) TABLET DAILY. (Patient taking differently: Take 25 mg by mouth daily. ) 45 tablet 3  . Tamsulosin HCl (FLOMAX) 0.4 MG CAPS Take 0.8 mg by mouth at bedtime.     . nitroGLYCERIN (NITROSTAT) 0.4 MG SL tablet Place 0.4 mg under the tongue every 5 (five) minutes as needed for chest pain.    ;  Inpatient Medications: Scheduled Meds: . amLODipine  10 mg Oral Daily  . aspirin  324 mg Oral NOW   Or  . aspirin  300 mg Rectal NOW  . aspirin EC  81 mg Oral Daily  . atorvastatin  80 mg Oral Daily  . ezetimibe  10 mg Oral Daily  . losartan  100 mg Oral Daily  . [START ON 04/27/2020] metoprolol succinate  25 mg Oral Daily  . omega-3 acid ethyl esters  2,000 mg Oral Daily  . pantoprazole  40 mg Oral Daily  . spironolactone  25 mg Oral Daily  . tamsulosin  0.8 mg Oral QHS   Continuous Infusions: . heparin 1,050 Units/hr (04/26/20 0117)   PRN Meds: acetaminophen, nitroGLYCERIN, ondansetron (ZOFRAN) IV, ondansetron, oxyCODONE  Allergies:    Allergies  Allergen Reactions  . Morphine Nausea Only    Social History:   Social History   Socioeconomic History  . Marital status: Married    Spouse name: Not on file  . Number of children: Not on file  . Years of education: Not on file  . Highest education level: Not on file  Occupational History  . Not on file  Tobacco Use  . Smoking status: Never Smoker  . Smokeless tobacco: Never Used  Vaping Use  . Vaping Use: Never used  Substance and Sexual Activity  . Alcohol use: Yes    Alcohol/week: 6.0 standard drinks    Types: 6 Cans of beer per week    Comment: weekly  . Drug use: No  . Sexual activity: Not on file  Other Topics Concern  . Not on file  Social History Narrative  . Not  on file   Social Determinants of Health   Financial Resource Strain:   . Difficulty of Paying Living Expenses:   Food Insecurity:   . Worried About Programme researcher, broadcasting/film/videounning Out of Food in the Last Year:   . Baristaan Out of Food in the Last Year:   Transportation Needs:   . Freight forwarderLack of Transportation (Medical):   Marland Kitchen. Lack of Transportation (Non-Medical):   Physical Activity:   . Days of Exercise per Week:   . Minutes of Exercise per Session:   Stress:   .  Feeling of Stress :   Social Connections:   . Frequency of Communication with Friends and Family:   . Frequency of Social Gatherings with Friends and Family:   . Attends Religious Services:   . Active Member of Clubs or Organizations:   . Attends Banker Meetings:   Marland Kitchen Marital Status:   Intimate Partner Violence:   . Fear of Current or Ex-Partner:   . Emotionally Abused:   Marland Kitchen Physically Abused:   . Sexually Abused:     Family History:    Family History  Problem Relation Age of Onset  . Heart disease Father   . Colon cancer Neg Hx   . Stomach cancer Neg Hx      ROS:  Please see the history of present illness.   All other ROS reviewed and negative.     Physical Exam/Data:   Vitals:   04/25/20 2308 04/26/20 0055 04/26/20 0300 04/26/20 0439  BP: (!) 177/68 (!) 178/66 (!) 131/46 (!) 184/52  Pulse: (!) 101 81 (!) 54 85  Resp: 17 18 15 19   Temp:    99.1 F (37.3 C)  TempSrc:    Oral  SpO2: 99% 96% 94% 95%  Weight:      Height:        Intake/Output Summary (Last 24 hours) at 04/26/2020 0813 Last data filed at 04/25/2020 2200 Gross per 24 hour  Intake 1000 ml  Output --  Net 1000 ml   Last 3 Weights 04/25/2020 04/25/2020 04/22/2020  Weight (lbs) 190 lb 190 lb 190 lb  Weight (kg) 86.183 kg 86.183 kg 86.183 kg     Body mass index is 29.76 kg/m.  General:  Well nourished, well developed, in no acute distress  HEENT: normal Lymph: no adenopathy Neck: no JVD Endocrine:  No thryomegaly Vascular: No carotid bruits; FA pulses 2+  bilaterally without bruits  Cardiac:  normal S1, S2; RRR; no murmur   Lungs:  clear to auscultation bilaterally, no wheezing, rhonchi or rales  Abd: soft, nontender, no hepatomegaly  Ext: no edema Musculoskeletal:  No deformities, BUE and BLE strength normal and equal Skin: warm and dry  Neuro:  CNs 2-12 intact, no focal abnormalities noted Psych:  Normal affect   EKG: 8/14 00 44 hours sinus rhythm with ST elevation 1 mm aVR and lead V1 ECG 8/13 1858 Sinus with ST elevation 67mm aVR and V1 ECG 7/21 Sinus with ST elevation 1 mm aVR and V1 Telemetry:  Telemetry was personally reviewed and demonstrates:   sinus  Relevant CV Studies:    Laboratory Data:  High Sensitivity Troponin:   Recent Labs  Lab 04/25/20 1958 04/25/20 2148  TROPONINIHS 111* 1,421*     Chemistry Recent Labs  Lab 04/23/20 0249 04/25/20 1846 04/26/20 0442  NA 132* 131* 129*  K 4.8 4.2 4.0  CL 102 102 101  CO2 22 20* 21*  GLUCOSE 179* 125* 101*  BUN 23 22 17   CREATININE 1.01 0.92 0.85  CALCIUM 8.9 8.6* 8.4*  GFRNONAA >60 >60 >60  GFRAA >60 >60 >60  ANIONGAP 8 9 7     Recent Labs  Lab 04/25/20 1846  PROT 6.6  ALBUMIN 3.3*  AST 35  ALT 18  ALKPHOS 42  BILITOT 1.6*   Hematology Recent Labs  Lab 04/23/20 0249 04/25/20 1846 04/26/20 0442  WBC 13.3* 15.4* 12.5*  RBC 3.35* 3.05* 2.94*  HGB 11.1* 9.9* 9.7*  HCT 32.7* 29.7* 28.6*  MCV 97.6 97.4 97.3  MCH 33.1 32.5  33.0  MCHC 33.9 33.3 33.9  RDW 12.4 13.0 13.0  PLT 213 213 219   BNP Recent Labs  Lab 04/25/20 2352  BNP 208.4*    DDimer No results for input(s): DDIMER in the last 168 hours.   Radiology/Studies:  CT Head Wo Contrast  Result Date: 04/25/2020 CLINICAL DATA:  Delirium EXAM: CT HEAD WITHOUT CONTRAST TECHNIQUE: Contiguous axial images were obtained from the base of the skull through the vertex without intravenous contrast. COMPARISON:  None. FINDINGS: Brain: No acute territorial infarction, hemorrhage or intracranial mass.  Mild atrophy. Mild hypodensity in the white matter consistent with chronic small vessel ischemic change. Nonenlarged ventricles. Vascular: No hyperdense vessels.  Carotid vascular calcifications. Skull: Normal. Negative for fracture or focal lesion. Sinuses/Orbits: No acute finding. Other: None IMPRESSION: 1. No CT evidence for acute intracranial abnormality. 2. Atrophy and mild chronic small vessel ischemic changes of the white matter. Electronically Signed   By: Jasmine Pang M.D.   On: 04/25/2020 20:40   DG Pelvis Portable  Result Date: 04/22/2020 CLINICAL DATA:  Right hip arthroplasty today. EXAM: PORTABLE PELVIS 1-2 VIEWS COMPARISON:  Concurrent hip exam. FINDINGS: Recent right hip arthroplasty in expected alignment. No periprosthetic lucency. Distal aspect of the femoral stem is included on concurrent right hip exam, reported separately. Recent postsurgical change includes air and edema in the soft tissues. Left hip arthroplasty is intact where visualized. IMPRESSION: Recent right hip arthroplasty in expected alignment. No immediate postoperative complication. Electronically Signed   By: Narda Rutherford M.D.   On: 04/22/2020 16:21   DG Chest Port 1 View  Result Date: 04/25/2020 CLINICAL DATA:  Possible sepsis EXAM: PORTABLE CHEST 1 VIEW COMPARISON:  07/19/2011 FINDINGS: Post sternotomy changes. No focal opacity or pleural effusion. Aortic atherosclerosis. No pneumothorax. IMPRESSION: No active disease. Electronically Signed   By: Jasmine Pang M.D.   On: 04/25/2020 19:44   DG Hip Port Unilat With Pelvis 1V Right  Result Date: 04/22/2020 CLINICAL DATA:  Status post right hip arthroplasty. EXAM: DG HIP (WITH OR WITHOUT PELVIS) 1V PORT RIGHT COMPARISON:  None. FINDINGS: Right hip arthroplasty in expected alignment. No periprosthetic lucency or fracture. Recent postsurgical change includes air and edema in the soft tissues and joint space. IMPRESSION: Right hip arthroplasty without immediate  postoperative complication. Electronically Signed   By: Narda Rutherford M.D.   On: 04/22/2020 16:19           Assessment and Plan:   1. Non-STEMI 2. Coronary artery disease with prior bypass surgery and known graft occlusion 3. Status post hip arthroplasty 4. Anemia secondary to #3 5. Hypertension 6. Altered mental status now back to normal   Patient with non-STEMI in the setting of prior hip surgery.  Not sure whether it is related to demand or occlusion.  We will begin clopidogrel.  Normal strategy would be catheterization; the family is somewhat reluctant.?  Is it too much? A noninvasive strategy of clopidogrel and Myoview scanning would be an alternative with some increased risks associated with this in terms of poor outcome.  I have spoken with her daughter-in-law who is a adult ICU cardiac nurse.  We will await the echocardiogram to look at LV function.  We will increase the beta-blockers given hypertension and discuss catheterization again tomorrow.  If asked the question is whether can be done through his radial.  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    For questions  or updates, please contact [facility] Please consult [email or web address] for contact info under    Signed, Sherryl Manges, MD  04/26/2020 8:13 AM

## 2020-04-26 NOTE — Progress Notes (Signed)
CRITICAL VALUE ALERT  Critical Value:  Troponin 17,390   Date & Time Notied:  8/14 @1300   Provider Notified: Samtani  Orders Received/Actions taken:

## 2020-04-27 LAB — COMPREHENSIVE METABOLIC PANEL
ALT: 36 U/L (ref 0–44)
AST: 92 U/L — ABNORMAL HIGH (ref 15–41)
Albumin: 3 g/dL — ABNORMAL LOW (ref 3.5–5.0)
Alkaline Phosphatase: 43 U/L (ref 38–126)
Anion gap: 11 (ref 5–15)
BUN: 15 mg/dL (ref 8–23)
CO2: 23 mmol/L (ref 22–32)
Calcium: 8.6 mg/dL — ABNORMAL LOW (ref 8.9–10.3)
Chloride: 100 mmol/L (ref 98–111)
Creatinine, Ser: 0.82 mg/dL (ref 0.61–1.24)
GFR calc Af Amer: >60 mL/min (ref >60)
GFR calc non Af Amer: >60 mL/min (ref >60)
Glucose, Bld: 118 mg/dL — ABNORMAL HIGH (ref 70–99)
Potassium: 4.3 mmol/L (ref 3.5–5.1)
Sodium: 134 mmol/L — ABNORMAL LOW (ref 135–145)
Total Bilirubin: 1.7 mg/dL — ABNORMAL HIGH (ref 0.3–1.2)
Total Protein: 6.1 g/dL — ABNORMAL LOW (ref 6.5–8.1)

## 2020-04-27 LAB — CBC
HCT: 28.5 % — ABNORMAL LOW (ref 39.0–52.0)
Hemoglobin: 9.6 g/dL — ABNORMAL LOW (ref 13.0–17.0)
MCH: 32.7 pg (ref 26.0–34.0)
MCHC: 33.7 g/dL (ref 30.0–36.0)
MCV: 96.9 fL (ref 80.0–100.0)
Platelets: 235 10*3/uL (ref 150–400)
RBC: 2.94 MIL/uL — ABNORMAL LOW (ref 4.22–5.81)
RDW: 12.7 % (ref 11.5–15.5)
WBC: 11.9 10*3/uL — ABNORMAL HIGH (ref 4.0–10.5)
nRBC: 0 % (ref 0.0–0.2)

## 2020-04-27 LAB — HEPARIN LEVEL (UNFRACTIONATED): Heparin Unfractionated: 0.5 IU/mL (ref 0.30–0.70)

## 2020-04-27 MED ORDER — SODIUM CHLORIDE 0.9% FLUSH
3.0000 mL | Freq: Two times a day (BID) | INTRAVENOUS | Status: DC
  Administered 2020-04-27 – 2020-04-29 (×4): 3 mL via INTRAVENOUS

## 2020-04-27 MED ORDER — METOPROLOL TARTRATE 50 MG PO TABS
50.0000 mg | ORAL_TABLET | Freq: Two times a day (BID) | ORAL | Status: DC
  Administered 2020-04-27 – 2020-04-29 (×5): 50 mg via ORAL
  Filled 2020-04-27 (×5): qty 1

## 2020-04-27 NOTE — Evaluation (Signed)
Physical Therapy Evaluation Patient Details Name: [name] MRN: [id number] DOB: [birth year] Today's Date: 04/27/2020   History of Present Illness  79 yo male admitted with NTEMI. Recent posteror THA 04/22/20. Hx of CABG,CAD  Clinical Impression  On eval, pt required Min-Mod assist for mobility. He walked ~45 feet with use of a RW. Moderate R hip pain with activity. Pt tolerated activity well. He is eager to get back on course with hip R THA rehab. Per pt and chart, plan is for a cardiac procedure over at [facility] on Monday. Will plan to follow pt during hospital stay. He would like to resume OP PT after discharge.     Follow Up Recommendations Outpatient PT    Equipment Recommendations  None recommended by PT    Recommendations for Other Services       Precautions / Restrictions Precautions Precautions: Fall;Posterior Hip Precaution Comments: Reviewed hip precautions. Pt unable to recall any. Restrictions Weight Bearing Restrictions: No RLE Weight Bearing: Weight bearing as tolerated      Mobility  Bed Mobility Overal bed mobility: Needs Assistance Bed Mobility: Supine to Sit     Supine to sit: Mod assist;HOB elevated     General bed mobility comments: Assist for trunk and R LE. Increased time. Cues for safety, technique.  Transfers Overall transfer level: Needs assistance Equipment used: Rolling walker (2 wheeled) Transfers: Sit to/from Stand Sit to Stand: Min assist;From elevated surface         General transfer comment: Assist to rise, stabilize, control descent. VCs safety, technique, hand/LE placement.  Ambulation/Gait Ambulation/Gait assistance: Min guard Gait Distance (Feet): 45 Feet Assistive device: Rolling walker (2 wheeled) Gait Pattern/deviations: Step-to pattern;Step-through pattern;Decreased stride length     General Gait Details: Pt walked 2 laps around the room. Mild lightheadedness  Stairs            Wheelchair Mobility     Modified Rankin (Stroke Patients Only)       Balance Overall balance assessment: Needs assistance         Standing balance support: Bilateral upper extremity supported Standing balance-Leahy Scale: Poor                               Pertinent Vitals/Pain Pain Assessment: 0-10 Pain Score: 5  Pain Location: R hip Pain Descriptors / Indicators: Discomfort;Sore;Aching Pain Intervention(s): Monitored during session;Repositioned    Home Living Family/patient expects to be discharged to:: Private residence Living Arrangements: Spouse/significant other Available Help at Discharge: Family Type of Home: House Home Access: Stairs to enter Entrance Stairs-Rails: Lawyer of Steps: 5 Home Layout: One level Home Equipment: Environmental consultant - 2 wheels;Cane - single point;Grab bars - tub/shower;Shower seat;Shower seat - built in      Prior Function Level of Independence: Needs assistance   Gait / Transfers Assistance Needed: using RW since hip surgery  ADL's / Homemaking Assistance Needed: help with bathing, dressing        Hand Dominance        Extremity/Trunk Assessment   Upper Extremity Assessment Upper Extremity Assessment: Generalized weakness    Lower Extremity Assessment Lower Extremity Assessment: Generalized weakness    Cervical / Trunk Assessment Cervical / Trunk Assessment: Normal  Communication   Communication: No difficulties  Cognition Arousal/Alertness: Awake/alert Behavior During Therapy: WFL for tasks assessed/performed Overall Cognitive Status: Within Functional Limits for tasks assessed  General Comments      Exercises Total Joint Exercises Ankle Circles/Pumps: AROM;Both;10 reps;Supine Quad Sets: AROM;Both;10 reps;Supine Heel Slides: AAROM;Right;10 reps;Supine Hip ABduction/ADduction: AAROM;Right;10 reps;Supine   Assessment/Plan    PT Assessment  Patient needs continued PT services  PT Problem List Decreased strength;Decreased range of motion;Decreased mobility;Decreased activity tolerance;Decreased balance;Decreased knowledge of use of DME;Pain       PT Treatment Interventions DME instruction;Therapeutic activities;Gait training;Therapeutic exercise;Patient/family education;Balance training;Functional mobility training    PT Goals (Current goals can be found in the Care Plan section)  Acute Rehab PT Goals Patient Stated Goal: to get back to independence with golfing, home decor/renovations PT Goal Formulation: With patient Time For Goal Achievement: 05/11/20 Potential to Achieve Goals: Good    Frequency Min 3X/week   Barriers to discharge        Co-evaluation               AM-PAC PT "6 Clicks" Mobility  Outcome Measure Help needed turning from your back to your side while in a flat bed without using bedrails?: A Lot Help needed moving from lying on your back to sitting on the side of a flat bed without using bedrails?: A Lot Help needed moving to and from a bed to a chair (including a wheelchair)?: A Little Help needed standing up from a chair using your arms (e.g., wheelchair or bedside chair)?: A Little Help needed to walk in hospital room?: A Little Help needed climbing 3-5 steps with a railing? : A Little 6 Click Score: 16    End of Session Equipment Utilized During Treatment: Gait belt Activity Tolerance: Patient tolerated treatment well Patient left: in chair;with call bell/phone within reach;with family/visitor present   PT Visit Diagnosis: Muscle weakness (generalized) (M62.81);Difficulty in walking, not elsewhere classified (R26.2)    Time: 4492-0100 PT Time Calculation (min) (ACUTE ONLY): 28 min   Charges:   PT Evaluation $PT Eval Low Complexity: 1 Low PT Treatments $Gait Training: 8-22 mins           Faye Ramsay, PT Acute Rehabilitation  Office: [phone] Pager: [phone]

## 2020-04-27 NOTE — Plan of Care (Signed)

## 2020-04-27 NOTE — Progress Notes (Signed)
Progress Note  Patient Name: [name] Date of Encounter: 04/27/2020  [facility] Cardiologist: DB   Patient Profile     79 y.o. male  with a hx of coronary artery disease and prior bypass surgery 2012, multiple antecedent PCI   seen 8/13 for  chest pain, positive troponin (111->>1421) 4 days status post hip arthroplasty associated with new anemia r.  He had presented to the emergency room with confusion-quickly resolving  Rx  heparin and beta-blockers    Subjective   No further chest pain.  Frustrating night.  Frequently awakened.  Inpatient Medications    Scheduled Meds: . amLODipine  10 mg Oral Daily  . aspirin EC  81 mg Oral Daily  . atorvastatin  80 mg Oral Daily  . ezetimibe  10 mg Oral Daily  . losartan  100 mg Oral Daily  . metoprolol tartrate  25 mg Oral Q8H  . omega-3 acid ethyl esters  2,000 mg Oral Daily  . pantoprazole  40 mg Oral Daily  . polyethylene glycol  17 g Oral Daily  . senna-docusate  1 tablet Oral QHS  . spironolactone  25 mg Oral Daily  . tamsulosin  0.8 mg Oral QHS   Continuous Infusions: . heparin 1,050 Units/hr (04/27/20 0124)   PRN Meds: acetaminophen, nitroGLYCERIN, ondansetron (ZOFRAN) IV, ondansetron, oxyCODONE   Vital Signs    Vitals:   04/26/20 1558 04/26/20 2004 04/27/20 0020 04/27/20 0440  BP: (!) 147/55 (!) 146/84 (!) 153/51 (!) 162/55  Pulse: 68 65 72 72  Resp: 18 18 18 18   Temp: 97.9 F (36.6 C) 99 F (37.2 C) 99.2 F (37.3 C) 97.9 F (36.6 C)  TempSrc: Oral Oral Oral Oral  SpO2: 97% 96% 96% 95%  Weight:      Height:        Intake/Output Summary (Last 24 hours) at 04/27/2020 0750 Last data filed at 04/27/2020 0551 Gross per 24 hour  Intake 954.13 ml  Output 750 ml  Net 204.13 ml   Last 3 Weights 04/25/2020 04/25/2020 04/22/2020  Weight (lbs) 190 lb 190 lb 190 lb  Weight (kg) 86.183 kg 86.183 kg 86.183 kg      Telemetry    Sinus  - Personally Reviewed  ECG      - Personally  Reviewed  Physical Exam    GEN: No acute distress.   Neck: No JVD Cardiac: RRR, no murmurs, rubs, or gallops.  Respiratory: Clear to auscultation bilaterally. GI: Soft, nontender, non-distended  MS: No edema; No deformity. Neuro:  Nonfocal  Psych: Normal affect   Labs    High Sensitivity Troponin:   Recent Labs  Lab 04/25/20 1958 04/25/20 2148 04/26/20 1132  TROPONINIHS 111* 1,421* 17,390*      Chemistry Recent Labs  Lab 04/25/20 1846 04/26/20 0442 04/27/20 0542  NA 131* 129* 134*  K 4.2 4.0 4.3  CL 102 101 100  CO2 20* 21* 23  GLUCOSE 125* 101* 118*  BUN 22 17 15   CREATININE 0.92 0.85 0.82  CALCIUM 8.6* 8.4* 8.6*  PROT 6.6  --  6.1*  ALBUMIN 3.3*  --  3.0*  AST 35  --  92*  ALT 18  --  36  ALKPHOS 42  --  43  BILITOT 1.6*  --  1.7*  GFRNONAA >60 >60 >60  GFRAA >60 >60 >60  ANIONGAP 9 7 11      Hematology Recent Labs  Lab 04/25/20 1846 04/26/20 0442 04/27/20 0542  WBC 15.4* 12.5*  11.9*  RBC 3.05* 2.94* 2.94*  HGB 9.9* 9.7* 9.6*  HCT 29.7* 28.6* 28.5*  MCV 97.4 97.3 96.9  MCH 32.5 33.0 32.7  MCHC 33.3 33.9 33.7  RDW 13.0 13.0 12.7  PLT 213 219 235    BNP Recent Labs  Lab 04/25/20 2352  BNP 208.4*     DDimer No results for input(s): DDIMER in the last 168 hours.   Radiology    CT Head Wo Contrast  Result Date: 04/25/2020 CLINICAL DATA:  Delirium EXAM: CT HEAD WITHOUT CONTRAST TECHNIQUE: Contiguous axial images were obtained from the base of the skull through the vertex without intravenous contrast. COMPARISON:  None. FINDINGS: Brain: No acute territorial infarction, hemorrhage or intracranial mass. Mild atrophy. Mild hypodensity in the white matter consistent with chronic small vessel ischemic change. Nonenlarged ventricles. Vascular: No hyperdense vessels.  Carotid vascular calcifications. Skull: Normal. Negative for fracture or focal lesion. Sinuses/Orbits: No acute finding. Other: None IMPRESSION: 1. No CT evidence for acute intracranial  abnormality. 2. Atrophy and mild chronic small vessel ischemic changes of the white matter. Electronically Signed   By: Jasmine PangKim  Fujinaga M.D.   On: 04/25/2020 20:40   DG Chest Port 1 View  Result Date: 04/25/2020 CLINICAL DATA:  Possible sepsis EXAM: PORTABLE CHEST 1 VIEW COMPARISON:  07/19/2011 FINDINGS: Post sternotomy changes. No focal opacity or pleural effusion. Aortic atherosclerosis. No pneumothorax. IMPRESSION: No active disease. Electronically Signed   By: Jasmine PangKim  Fujinaga M.D.   On: 04/25/2020 19:44   ECHOCARDIOGRAM COMPLETE  Result Date: 04/26/2020    ECHOCARDIOGRAM REPORT   Patient Name:   [name] Date of Exam: 04/26/2020 Medical Rec #:  [id number]              Height:       67.0 in Accession #:    [id number]             Weight:       190.0 lb Date of Birth:  01/24/1941               BSA:          1.979 m Patient Age:    79 years               BP:           186/57 mmHg Patient Gender: M                      HR:           89 bpm. Exam Location:  Inpatient Procedure: 2D Echo Indications:    NSTEMI I21.4  History:        Patient has no prior history of Echocardiogram examinations.                 CAD, Prior CABG, Carotid Disease; Risk Factors:Hypertension and                 Dyslipidemia.  Sonographer:    Leta Junglingiffany Cooper RDCS Referring Phys: [id number] Sunnie NielsenNATALIE ALEXANDER IMPRESSIONS  1. Left ventricular ejection fraction, by estimation, is 40 to 45%. The left ventricle has mildly decreased function. The left ventricle demonstrates regional wall motion abnormalities (see scoring diagram/findings for description). There is mild concentric left ventricular hypertrophy. Left ventricular diastolic parameters were normal.  2. Right ventricular systolic function is normal. The right ventricular size is normal. There is normal pulmonary artery systolic pressure. The estimated right ventricular systolic pressure is 32.8 mmHg.  3.  The mitral valve is grossly normal. No evidence of mitral valve regurgitation.  No evidence of mitral stenosis.  4. The aortic valve is tricuspid. Aortic valve regurgitation is not visualized. Mild to moderate aortic valve sclerosis/calcification is present, without any evidence of aortic stenosis.  5. The inferior vena cava is normal in size with greater than 50% respiratory variability, suggesting right atrial pressure of 3 mmHg. FINDINGS  Left Ventricle: Left ventricular ejection fraction, by estimation, is 40 to 45%. The left ventricle has mildly decreased function. The left ventricle demonstrates regional wall motion abnormalities. The left ventricular internal cavity size was normal in size. There is mild concentric left ventricular hypertrophy. Left ventricular diastolic parameters were normal.  LV Wall Scoring: The antero-lateral wall and posterior wall are hypokinetic. Right Ventricle: The right ventricular size is normal. No increase in right ventricular wall thickness. Right ventricular systolic function is normal. There is normal pulmonary artery systolic pressure. The tricuspid regurgitant velocity is 2.73 m/s, and  with an assumed right atrial pressure of 3 mmHg, the estimated right ventricular systolic pressure is 32.8 mmHg. Left Atrium: Left atrial size was normal in size. Right Atrium: Right atrial size was normal in size. Pericardium: Trivial pericardial effusion is present. Mitral Valve: The mitral valve is grossly normal. No evidence of mitral valve regurgitation. No evidence of mitral valve stenosis. Tricuspid Valve: The tricuspid valve is grossly normal. Tricuspid valve regurgitation is trivial. No evidence of tricuspid stenosis. Aortic Valve: The aortic valve is tricuspid. . There is moderate thickening and moderate calcification of the aortic valve. Aortic valve regurgitation is not visualized. Mild to moderate aortic valve sclerosis/calcification is present, without any evidence of aortic stenosis. There is moderate thickening of the aortic valve. There is moderate  calcification of the aortic valve. Pulmonic Valve: The pulmonic valve was grossly normal. Pulmonic valve regurgitation is not visualized. No evidence of pulmonic stenosis. Aorta: The aortic root and ascending aorta are structurally normal, with no evidence of dilitation. Venous: The inferior vena cava is normal in size with greater than 50% respiratory variability, suggesting right atrial pressure of 3 mmHg. IAS/Shunts: The atrial septum is grossly normal.  LEFT VENTRICLE PLAX 2D LVIDd:         5.20 cm  Diastology LVIDs:         3.90 cm  LV e' lateral:   15.80 cm/s LV PW:         1.10 cm  LV E/e' lateral: 6.5 LV IVS:        1.50 cm  LV e' medial:    8.92 cm/s LVOT diam:     2.20 cm  LV E/e' medial:  11.5 LV SV:         77 LV SV Index:   39 LVOT Area:     3.80 cm  RIGHT VENTRICLE RV S prime:     10.20 cm/s TAPSE (M-mode): 1.8 cm LEFT ATRIUM             Index       RIGHT ATRIUM           Index LA diam:        4.00 cm 2.02 cm/m  RA Area:     14.20 cm LA Vol (A2C):   51.4 ml 25.98 ml/m RA Volume:   36.40 ml  18.40 ml/m LA Vol (A4C):   40.9 ml 20.67 ml/m LA Biplane Vol: 46.2 ml 23.35 ml/m  AORTIC VALVE LVOT Vmax:   110.00 cm/s LVOT Vmean:  65.600  cm/s LVOT VTI:    0.202 m  AORTA Ao Root diam: 3.60 cm MITRAL VALVE                TRICUSPID VALVE MV Area (PHT): 5.88 cm     TR Peak grad:   29.8 mmHg MV Decel Time: 129 msec     TR Vmax:        273.00 cm/s MV E velocity: 103.00 cm/s MV A velocity: 90.60 cm/s   SHUNTS MV E/A ratio:  1.14         Systemic VTI:  0.20 m                             Systemic Diam: 2.20 cm Lennie Odor MD Electronically signed by Lennie Odor MD Signature Date/Time: 04/26/2020/10:46:51 AM    Final     Cardiac Studies   8/21 Echo >> EF EF 40-45% with AL and posterior wall HK  Assessment & Plan    1. Non-STEMI 2. Coronary artery disease with prior bypass surgery and known graft occlusion 3. Status post hip arthroplasty 4. Anemia secondary to #3 5. Hypertension 6. Altered mental  status now back to normal  Reviewed with DB, feels radial access would be doable Increase BB; appropriate with decreased EF to switch to entresto prior to discharge Discussed cath again family is in agreement and will plan for tomorrow.  Stressed the importance for addressing the heart and being cognizant of the issues related to his MI in the context of physical therapy for his hip    For questions or updates, please contact [facility] Please consult [email or web address] for contact info under        Signed, Sherryl Manges, MD  04/27/2020, 7:50 AM

## 2020-04-27 NOTE — Plan of Care (Signed)

## 2020-04-27 NOTE — Progress Notes (Signed)
PROGRESS NOTE    [name]  UVO:[id number] DOB: 03-May-1941 DOA: 04/25/2020 PCP: Gaspar Garbe, MD  Brief Narrative:  14 white male Prior CAD status post multiple PCI in 1983-CABG X4 06/18/2011-last cardiac cath 05/04/2019 showing revascularization and noncritical stenosis and anatomy precluding PCI at that time Carotid bruit with last check 01/2017 R ICA 40-59% LICA 1-39% HTN HLD Right inguinal hernia successfully operated on 03/07/2020 Dr. Luisa Hart Hip arthroplasty 04/22/2020 Dr. 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 NSTEMI in the setting of cath 2020 showing no targets a. Possible cardiac cath 8/16 b. No current chest pain c. Echo does not show wall motion abnormality although slight decrease in EF d. Rest as per cardiology 2. Toxic metabolic encephalopathy on admission a. Wife relates medications were "completely changed" when patient went home from recent surgery b. Unlikely infection although is growing 30,000 CFU Klebsiella on a cath specimen c. I would continue to hold antibiotics at this time as he has no symptoms of infection and no dysuria and his white count is coming down on its own 3. Hip arthroplasty 04/22/2020 Dr. Dion Saucier a. I will CC his orthopedic surgeon on his recent readmission b. He will require aspirin Plavix on discharge because of NSTEMI so this may alter his postop anticoagulation plan ASA 325  twice daily initiated by them 4. Hyponatremia possible SIADH or secondary to tea toast potomania a. Improved 5. Leukocytosis a. See discussion #2 6. Hypertension a. Continuing amlodipine in addition to above medications b. Adjust meds as needed 7. Reflux 8. BPH a. Continue Flomax 0.4 b. See above discussions 9. HLD a. Goal LDL below 70 given risk factors-continue with high intensity atorvastatin 80, Zetia 10 and omega-3 fatty acids can be resumed  DVT prophylaxis: On IV heparin at this time Code Status: Full code Family Communication: Discussed with daughter at the bedside in detail on 8/15  Disposition:   Status is: Inpatient  Remains inpatient appropriate because:Hemodynamically unstable, Persistent severe electrolyte disturbances, Unsafe d/c plan and IV treatments appropriate due to intensity of illness or inability to take PO   Dispo: The patient is from: Home              Anticipated d/c is to: Home              Anticipated d/c date is: > 3 days              Patient currently is not medically stable to d/c.   Consultants:   Cardiology  Procedures:  Echocardiogram performed 8/14 IMPRESSIONS    1. Left ventricular ejection fraction, by estimation, is 40 to 45%. The  left ventricle has mildly decreased function. The left ventricle  demonstrates regional wall motion abnormalities (see scoring  diagram/findings for description). There is mild  concentric left ventricular hypertrophy. Left ventricular diastolic  parameters were normal.  2. Right ventricular systolic function is normal. The right ventricular  size is normal. There is normal  pulmonary artery systolic pressure. The  estimated right ventricular systolic pressure is 32.8 mmHg.  3. The mitral valve is grossly normal. No evidence of mitral valve  regurgitation. No evidence of mitral stenosis.  4. The aortic valve is tricuspid. Aortic valve regurgitation is not  visualized. Mild to moderate aortic valve  sclerosis/calcification is  present, without any evidence of aortic stenosis.  5. The inferior vena cava is normal in size with greater than 50%  respiratory variability, suggesting right atrial pressure of 3 mmHg.   Antimicrobials: None   Subjective: Awake coherent no distress seen at the bedside with his daughter Coherent laughing joking drinking milkshake Walked 40 feet with therapy services today  Objective: Vitals:   04/27/20 0020 04/27/20 0440 04/27/20 1016 04/27/20 1148  BP: (!) 153/51 (!) 162/55 (!) 160/59 (!) 135/55  Pulse: 72 72 74 (!) 55  Resp: 18 18  18   Temp: 99.2 F (37.3 C) 97.9 F (36.6 C)  99 F (37.2 C)  TempSrc: Oral Oral  Oral  SpO2: 96% 95%  96%  Weight:      Height:        Intake/Output Summary (Last 24 hours) at 04/27/2020 1633 Last data filed at 04/27/2020 0900 Gross per 24 hour  Intake 714.13 ml  Output 950 ml  Net -235.87 ml   Filed Weights   04/25/20 1829 04/25/20 1949  Weight: 86.2 kg 86.2 kg    Examination:  Pleasant euthymic coherent no distress EOMI NCAT no focal deficit Chest clear Abdomen soft no rebound no guarding Right hip bandages taken off some crusting of blood around the staples but otherwise looks quite clean No lower extremity edema No focal deficit moving all 4 limbs equally 5 out of 5 power  Data Reviewed: I have personally reviewed following labs and imaging studies Sodium up to 134 BUN/creatinine 15/0.8 AST 92 ALT 36 WBC 11.9, hemoglobin 9.6  Radiology Studies: CT Head Wo Contrast  Result Date: 04/25/2020 CLINICAL DATA:  Delirium EXAM: CT HEAD WITHOUT CONTRAST TECHNIQUE: Contiguous axial images were obtained from the base of the skull through the vertex without intravenous contrast. COMPARISON:  None. FINDINGS: Brain: No acute territorial infarction, hemorrhage or intracranial mass. Mild atrophy. Mild hypodensity in the white matter consistent with chronic small vessel ischemic change. Nonenlarged ventricles.  Vascular: No hyperdense vessels.  Carotid vascular calcifications. Skull: Normal. Negative for fracture or focal lesion. Sinuses/Orbits: No acute finding. Other: None IMPRESSION: 1. No CT evidence for acute intracranial abnormality. 2. Atrophy and mild chronic small vessel ischemic changes of the white matter. Electronically Signed   By: 04/27/2020 M.D.   On: 04/25/2020 20:40   DG Chest Port 1 View  Result Date: 04/25/2020 CLINICAL DATA:  Possible sepsis EXAM: PORTABLE CHEST 1 VIEW COMPARISON:  07/19/2011 FINDINGS: Post sternotomy changes. No focal opacity or pleural effusion. Aortic atherosclerosis. No pneumothorax. IMPRESSION: No active disease. Electronically Signed   By: 13/01/2011 M.D.   On: 04/25/2020 19:44   ECHOCARDIOGRAM COMPLETE  Result Date: 04/26/2020    ECHOCARDIOGRAM REPORT   Patient Name:   Keandre Linden [name] Date of Exam: 04/26/2020 Medical Rec #:  04/28/2020              Height:       67.0 in Accession #:    [id number]             Weight:       190.0 lb Date of Birth:  11/04/1940  BSA:          1.979 m Patient Age:    79 years               BP:           186/57 mmHg Patient Gender: M                      HR:           89 bpm. Exam Location:  Inpatient Procedure: 2D Echo Indications:    NSTEMI I21.4  History:        Patient has no prior history of Echocardiogram examinations.                 CAD, Prior CABG, Carotid Disease; Risk Factors:Hypertension and                 Dyslipidemia.  Sonographer:    Leta Jungling RDCS Referring Phys: [id number] Sunnie Nielsen IMPRESSIONS  1. Left ventricular ejection fraction, by estimation, is 40 to 45%. The left ventricle has mildly decreased function. The left ventricle demonstrates regional wall motion abnormalities (see scoring diagram/findings for description). There is mild concentric left ventricular hypertrophy. Left ventricular diastolic parameters were normal.  2. Right ventricular systolic function is normal. The right  ventricular size is normal. There is normal pulmonary artery systolic pressure. The estimated right ventricular systolic pressure is 32.8 mmHg.  3. The mitral valve is grossly normal. No evidence of mitral valve regurgitation. No evidence of mitral stenosis.  4. The aortic valve is tricuspid. Aortic valve regurgitation is not visualized. Mild to moderate aortic valve sclerosis/calcification is present, without any evidence of aortic stenosis.  5. The inferior vena cava is normal in size with greater than 50% respiratory variability, suggesting right atrial pressure of 3 mmHg. FINDINGS  Left Ventricle: Left ventricular ejection fraction, by estimation, is 40 to 45%. The left ventricle has mildly decreased function. The left ventricle demonstrates regional wall motion abnormalities. The left ventricular internal cavity size was normal in size. There is mild concentric left ventricular hypertrophy. Left ventricular diastolic parameters were normal.  LV Wall Scoring: The antero-lateral wall and posterior wall are hypokinetic. Right Ventricle: The right ventricular size is normal. No increase in right ventricular wall thickness. Right ventricular systolic function is normal. There is normal pulmonary artery systolic pressure. The tricuspid regurgitant velocity is 2.73 m/s, and  with an assumed right atrial pressure of 3 mmHg, the estimated right ventricular systolic pressure is 32.8 mmHg. Left Atrium: Left atrial size was normal in size. Right Atrium: Right atrial size was normal in size. Pericardium: Trivial pericardial effusion is present. Mitral Valve: The mitral valve is grossly normal. No evidence of mitral valve regurgitation. No evidence of mitral valve stenosis. Tricuspid Valve: The tricuspid valve is grossly normal. Tricuspid valve regurgitation is trivial. No evidence of tricuspid stenosis. Aortic Valve: The aortic valve is tricuspid. . There is moderate thickening and moderate calcification of the aortic  valve. Aortic valve regurgitation is not visualized. Mild to moderate aortic valve sclerosis/calcification is present, without any evidence of aortic stenosis. There is moderate thickening of the aortic valve. There is moderate calcification of the aortic valve. Pulmonic Valve: The pulmonic valve was grossly normal. Pulmonic valve regurgitation is not visualized. No evidence of pulmonic stenosis. Aorta: The aortic root and ascending aorta are structurally normal, with no evidence of dilitation. Venous: The inferior vena cava is normal in size with greater than 50% respiratory variability, suggesting  right atrial pressure of 3 mmHg. IAS/Shunts: The atrial septum is grossly normal.  LEFT VENTRICLE PLAX 2D LVIDd:         5.20 cm  Diastology LVIDs:         3.90 cm  LV e' lateral:   15.80 cm/s LV PW:         1.10 cm  LV E/e' lateral: 6.5 LV IVS:        1.50 cm  LV e' medial:    8.92 cm/s LVOT diam:     2.20 cm  LV E/e' medial:  11.5 LV SV:         77 LV SV Index:   39 LVOT Area:     3.80 cm  RIGHT VENTRICLE RV S prime:     10.20 cm/s TAPSE (M-mode): 1.8 cm LEFT ATRIUM             Index       RIGHT ATRIUM           Index LA diam:        4.00 cm 2.02 cm/m  RA Area:     14.20 cm LA Vol (A2C):   51.4 ml 25.98 ml/m RA Volume:   36.40 ml  18.40 ml/m LA Vol (A4C):   40.9 ml 20.67 ml/m LA Biplane Vol: 46.2 ml 23.35 ml/m  AORTIC VALVE LVOT Vmax:   110.00 cm/s LVOT Vmean:  65.600 cm/s LVOT VTI:    0.202 m  AORTA Ao Root diam: 3.60 cm MITRAL VALVE                TRICUSPID VALVE MV Area (PHT): 5.88 cm     TR Peak grad:   29.8 mmHg MV Decel Time: 129 msec     TR Vmax:        273.00 cm/s MV E velocity: 103.00 cm/s MV A velocity: 90.60 cm/s   SHUNTS MV E/A ratio:  1.14         Systemic VTI:  0.20 m                             Systemic Diam: 2.20 cm Lennie OdorWesley O'Neal MD Electronically signed by Lennie OdorWesley O'Neal MD Signature Date/Time: 04/26/2020/10:46:51 AM    Final      Scheduled Meds: . amLODipine  10 mg Oral Daily  . aspirin EC   81 mg Oral Daily  . atorvastatin  80 mg Oral Daily  . ezetimibe  10 mg Oral Daily  . losartan  100 mg Oral Daily  . metoprolol tartrate  50 mg Oral BID  . omega-3 acid ethyl esters  2,000 mg Oral Daily  . pantoprazole  40 mg Oral Daily  . polyethylene glycol  17 g Oral Daily  . senna-docusate  1 tablet Oral QHS  . sodium chloride flush  3 mL Intravenous Q12H  . spironolactone  25 mg Oral Daily  . tamsulosin  0.8 mg Oral QHS   Continuous Infusions: . heparin 1,050 Units/hr (04/27/20 0124)     LOS: 2 days    Time spent: 25 minutes  Rhetta MuraJai-Gurmukh Napoleon Monacelli, MD [facility] To contact the attending provider between 7A-7P or the covering provider during after hours 7P-7A, please log into the web site [email or web address] and access using universal [place] password for that web site. If you do not have the password, please call the hospital operator.  04/27/2020, 4:33 PM

## 2020-04-27 NOTE — Progress Notes (Signed)
ANTICOAGULATION CONSULT NOTE  Pharmacy Consult for Heparin Indication: chest pain/ACS  Allergies  Allergen Reactions  . Morphine Nausea Only    Patient Measurements: Height: 5\' 7"  (170.2 cm) Weight: 86.2 kg (190 lb) IBW/kg (Calculated) : 66.1 Heparin Dosing Weight: 83.7 kg  Vital Signs: Temp: 97.9 F (36.6 C) (08/15 0440) Temp Source: Oral (08/15 0440) BP: 160/59 (08/15 1016) Pulse Rate: 74 (08/15 1016)  Labs: Recent Labs    04/25/20 1846 04/25/20 1846 04/25/20 1958 04/25/20 2148 04/26/20 0442 04/26/20 1132 04/26/20 1927 04/27/20 0542  HGB 9.9*   < >  --   --  9.7*  --   --  9.6*  HCT 29.7*  --   --   --  28.6*  --   --  28.5*  PLT 213  --   --   --  219  --   --  235  APTT 30  --   --   --   --   --   --   --   LABPROT 12.7  --   --   --  12.8  --   --   --   INR 1.0  --   --   --  1.0  --   --   --   HEPARINUNFRC  --   --   --   --   --  0.51 0.36 0.50  CREATININE 0.92  --   --   --  0.85  --   --  0.82  TROPONINIHS  --   --  111* 1,421*  --  17,390*  --   --    < > = values in this interval not displayed.    Estimated Creatinine Clearance: 79.1 mL/min (by C-G formula based on SCr of 0.82 mg/dL).  Assessment: Patient with ACS and + CE.  MD requests heparin per pharmacy for ACS.  No oral anticoagulants noted on med rec. Baseline coags WNL.   04/27/2020 heparin level therapeutic at 0.5 on heparin drip at 1050 units/hr CBC stable, no bleeding reported.   Non-STEMI w/ elevated troponins.  8/14 echo: EF 40-45 %  Goal of Therapy:  Heparin level 0.3-0.7 units/ml Monitor platelets by anticoagulation protocol: Yes   Plan:  Continue Heparin drip at 1050 units/hr Daily CBC & heparin level F/u coag plans post-cath Monday 8/16  9/16, Pharm.D 04/27/2020 10:46 AM

## 2020-04-28 ENCOUNTER — Encounter (HOSPITAL_COMMUNITY)
Admission: EM | Disposition: A | Discharge status: Home / Self Care | Payer: Self-pay | Source: Home / Self Care | Attending: Family Medicine

## 2020-04-28 DIAGNOSIS — I2510 Atherosclerotic heart disease of native coronary artery without angina pectoris: Secondary | ICD-10-CM

## 2020-04-28 DIAGNOSIS — I25810 Atherosclerosis of coronary artery bypass graft(s) without angina pectoris: Secondary | ICD-10-CM

## 2020-04-28 HISTORY — PX: LEFT HEART CATH AND CORS/GRAFTS ANGIOGRAPHY: CATH118250

## 2020-04-28 LAB — COMPREHENSIVE METABOLIC PANEL
ALT: 50 U/L — ABNORMAL HIGH (ref 0–44)
AST: 77 U/L — ABNORMAL HIGH (ref 15–41)
Albumin: 2.7 g/dL — ABNORMAL LOW (ref 3.5–5.0)
Alkaline Phosphatase: 42 U/L (ref 38–126)
Anion gap: 8 (ref 5–15)
BUN: 16 mg/dL (ref 8–23)
CO2: 23 mmol/L (ref 22–32)
Calcium: 8.3 mg/dL — ABNORMAL LOW (ref 8.9–10.3)
Chloride: 104 mmol/L (ref 98–111)
Creatinine, Ser: 0.82 mg/dL (ref 0.61–1.24)
GFR calc Af Amer: >60 mL/min (ref >60)
GFR calc non Af Amer: >60 mL/min (ref >60)
Glucose, Bld: 109 mg/dL — ABNORMAL HIGH (ref 70–99)
Potassium: 4.3 mmol/L (ref 3.5–5.1)
Sodium: 135 mmol/L (ref 135–145)
Total Bilirubin: 1.3 mg/dL — ABNORMAL HIGH (ref 0.3–1.2)
Total Protein: 5.9 g/dL — ABNORMAL LOW (ref 6.5–8.1)

## 2020-04-28 LAB — CBC
HCT: 27 % — ABNORMAL LOW (ref 39.0–52.0)
Hemoglobin: 9.1 g/dL — ABNORMAL LOW (ref 13.0–17.0)
MCH: 32.7 pg (ref 26.0–34.0)
MCHC: 33.7 g/dL (ref 30.0–36.0)
MCV: 97.1 fL (ref 80.0–100.0)
Platelets: 242 10*3/uL (ref 150–400)
RBC: 2.78 MIL/uL — ABNORMAL LOW (ref 4.22–5.81)
RDW: 12.7 % (ref 11.5–15.5)
WBC: 10.8 10*3/uL — ABNORMAL HIGH (ref 4.0–10.5)
nRBC: 0 % (ref 0.0–0.2)

## 2020-04-28 LAB — HEPARIN LEVEL (UNFRACTIONATED): Heparin Unfractionated: 0.56 IU/mL (ref 0.30–0.70)

## 2020-04-28 LAB — URINE CULTURE: Culture: 30000 — AB

## 2020-04-28 SURGERY — LEFT HEART CATH AND CORS/GRAFTS ANGIOGRAPHY
Anesthesia: LOCAL

## 2020-04-28 MED ORDER — SODIUM CHLORIDE 0.9 % WEIGHT BASED INFUSION
3.0000 mL/kg/h | INTRAVENOUS | Status: AC
  Administered 2020-04-28: 3 mL/kg/h via INTRAVENOUS

## 2020-04-28 MED ORDER — SODIUM CHLORIDE 0.9 % WEIGHT BASED INFUSION: 1.0000 mL/kg/h | INTRAVENOUS | Status: DC

## 2020-04-28 MED ORDER — HEPARIN (PORCINE) IN NACL 1000-0.9 UT/500ML-% IV SOLN
INTRAVENOUS | Status: AC
  Filled 2020-04-28: qty 1000

## 2020-04-28 MED ORDER — IOHEXOL 350 MG/ML SOLN
INTRAVENOUS | Status: DC | PRN
  Administered 2020-04-28: 125 mL

## 2020-04-28 MED ORDER — LIDOCAINE HCL (PF) 1 % IJ SOLN
INTRAMUSCULAR | Status: DC | PRN
  Administered 2020-04-28: 20 mL via SUBCUTANEOUS

## 2020-04-28 MED ORDER — IOHEXOL 350 MG/ML SOLN
INTRAVENOUS | Status: AC
  Filled 2020-04-28: qty 1

## 2020-04-28 MED ORDER — MIDAZOLAM HCL 2 MG/2ML IJ SOLN
INTRAMUSCULAR | Status: DC | PRN
  Administered 2020-04-28: 1 mg via INTRAVENOUS

## 2020-04-28 MED ORDER — SODIUM CHLORIDE 0.9 % WEIGHT BASED INFUSION
3.0000 mL/kg/h | INTRAVENOUS | Status: DC
Start: 2020-04-29 — End: 2020-04-28

## 2020-04-28 MED ORDER — ASPIRIN 81 MG PO CHEW
81.0000 mg | CHEWABLE_TABLET | ORAL | Status: DC
Start: 2020-04-29 — End: 2020-04-28

## 2020-04-28 MED ORDER — ASPIRIN 81 MG PO CHEW
81.0000 mg | CHEWABLE_TABLET | ORAL | Status: AC
  Administered 2020-04-28: 81 mg via ORAL
  Filled 2020-04-28: qty 1

## 2020-04-28 MED ORDER — FENTANYL CITRATE (PF) 100 MCG/2ML IJ SOLN
INTRAMUSCULAR | Status: AC
  Filled 2020-04-28: qty 2

## 2020-04-28 MED ORDER — LIDOCAINE HCL (PF) 1 % IJ SOLN
INTRAMUSCULAR | Status: AC
  Filled 2020-04-28: qty 30

## 2020-04-28 MED ORDER — FENTANYL CITRATE (PF) 100 MCG/2ML IJ SOLN
INTRAMUSCULAR | Status: DC | PRN
  Administered 2020-04-28: 25 ug via INTRAVENOUS

## 2020-04-28 MED ORDER — SODIUM CHLORIDE 0.9% FLUSH
3.0000 mL | Freq: Two times a day (BID) | INTRAVENOUS | Status: DC
  Administered 2020-04-29: 3 mL via INTRAVENOUS

## 2020-04-28 MED ORDER — SODIUM CHLORIDE 0.9% FLUSH: 3.0000 mL | INTRAVENOUS | Status: DC | PRN

## 2020-04-28 MED ORDER — SODIUM CHLORIDE 0.9 % IV SOLN: 250.0000 mL | INTRAVENOUS | Status: DC | PRN

## 2020-04-28 MED ORDER — SODIUM CHLORIDE 0.9 % IV SOLN: INTRAVENOUS | Status: AC

## 2020-04-28 MED ORDER — HEPARIN (PORCINE) IN NACL 1000-0.9 UT/500ML-% IV SOLN
INTRAVENOUS | Status: DC | PRN
  Administered 2020-04-28 (×2): 500 mL

## 2020-04-28 MED ORDER — MIDAZOLAM HCL 2 MG/2ML IJ SOLN
INTRAMUSCULAR | Status: AC
  Filled 2020-04-28: qty 2

## 2020-04-28 SURGICAL SUPPLY — 11 items
CATH INFINITI 5FR AL1 (CATHETERS) ×1 IMPLANT
CATH INFINITI JR4 5F (CATHETERS) ×1 IMPLANT
CLOSURE MYNX CONTROL 5F (Vascular Products) ×1 IMPLANT
KIT HEART LEFT (KITS) ×2 IMPLANT
PACK CARDIAC CATHETERIZATION (CUSTOM PROCEDURE TRAY) ×2 IMPLANT
SHEATH PINNACLE 5F 10CM (SHEATH) ×1 IMPLANT
SHEATH PROBE COVER 6X72 (BAG) ×1 IMPLANT
SYR MEDRAD MARK 7 150ML (SYRINGE) ×2 IMPLANT
TRANSDUCER W/STOPCOCK (MISCELLANEOUS) ×2 IMPLANT
TUBING CIL FLEX 10 FLL-RA (TUBING) ×2 IMPLANT
WIRE EMERALD 3MM-J .035X150CM (WIRE) ×1 IMPLANT

## 2020-04-28 NOTE — H&P (View-Only) (Signed)
Progress Note  Patient Name: [name] Date of Encounter: 04/28/2020  [facility] Cardiologist: No primary care provider on file.   Subjective   No chest pain no shortness of breath. Hip uncomfortable  Inpatient Medications    Scheduled Meds: . amLODipine  10 mg Oral Daily  . aspirin EC  81 mg Oral Daily  . atorvastatin  80 mg Oral Daily  . ezetimibe  10 mg Oral Daily  . losartan  100 mg Oral Daily  . metoprolol tartrate  50 mg Oral BID  . omega-3 acid ethyl esters  2,000 mg Oral Daily  . pantoprazole  40 mg Oral Daily  . polyethylene glycol  17 g Oral Daily  . senna-docusate  1 tablet Oral QHS  . sodium chloride flush  3 mL Intravenous Q12H  . spironolactone  25 mg Oral Daily  . tamsulosin  0.8 mg Oral QHS   Continuous Infusions: . sodium chloride    . heparin 1,050 Units/hr (04/28/20 0132)   PRN Meds: acetaminophen, nitroGLYCERIN, ondansetron (ZOFRAN) IV, ondansetron, oxyCODONE   Vital Signs    Vitals:   04/27/20 1148 04/27/20 2107 04/28/20 0608 04/28/20 0635  BP: (!) 135/55 (!) 145/72 (!) 175/64   Pulse: (!) 55 67 78   Resp: 18 20 18    Temp: 99 F (37.2 C) 98.6 F (37 C) 98.3 F (36.8 C)   TempSrc: Oral Oral Oral   SpO2: 96% 97% 97%   Weight:    88.6 kg  Height:        Intake/Output Summary (Last 24 hours) at 04/28/2020 1107 Last data filed at 04/28/2020 0636 Gross per 24 hour  Intake 171.3 ml  Output 1100 ml  Net -928.7 ml   Last 3 Weights 04/28/2020 04/25/2020 04/25/2020  Weight (lbs) 195 lb 6.4 oz 190 lb 190 lb  Weight (kg) 88.633 kg 86.183 kg 86.183 kg      Telemetry    No adverse arrhythmias- Personally Reviewed  ECG    Sinus rhythm with no ST elevation- Personally Reviewed  Physical Exam   GEN: No acute distress.   Neck: No JVD Cardiac: RRR, no murmurs, rubs, or gallops.  Respiratory: Clear to auscultation bilaterally. GI: Soft, nontender, non-distended  MS: No edema; postoperative changes Neuro:  Nonfocal  Psych:  Normal affect   Labs    High Sensitivity Troponin:   Recent Labs  Lab 04/25/20 1958 04/25/20 2148 04/26/20 1132  TROPONINIHS 111* 1,421* 17,390*      Chemistry Recent Labs  Lab 04/25/20 1846 04/25/20 1846 04/26/20 0442 04/27/20 0542 04/28/20 0521  NA 131*   < > 129* 134* 135  K 4.2   < > 4.0 4.3 4.3  CL 102   < > 101 100 104  CO2 20*   < > 21* 23 23  GLUCOSE 125*   < > 101* 118* 109*  BUN 22   < > 17 15 16   CREATININE 0.92   < > 0.85 0.82 0.82  CALCIUM 8.6*   < > 8.4* 8.6* 8.3*  PROT 6.6  --   --  6.1* 5.9*  ALBUMIN 3.3*  --   --  3.0* 2.7*  AST 35  --   --  92* 77*  ALT 18  --   --  36 50*  ALKPHOS 42  --   --  43 42  BILITOT 1.6*  --   --  1.7* 1.3*  GFRNONAA >60   < > >60 >60 >60  GFRAA >60   < > >60 >60 >60  ANIONGAP 9   < > 7 11 8    < > = values in this interval not displayed.     Hematology Recent Labs  Lab 04/26/20 0442 04/27/20 0542 04/28/20 0521  WBC 12.5* 11.9* 10.8*  RBC 2.94* 2.94* 2.78*  HGB 9.7* 9.6* 9.1*  HCT 28.6* 28.5* 27.0*  MCV 97.3 96.9 97.1  MCH 33.0 32.7 32.7  MCHC 33.9 33.7 33.7  RDW 13.0 12.7 12.7  PLT 219 235 242    BNP Recent Labs  Lab 04/25/20 2352  BNP 208.4*     DDimer No results for input(s): DDIMER in the last 168 hours.   Radiology    No results found.   Patient Profile     79 y.o. male with EF 40 to 45% with anterolateral and posterior wall hypokinesis with non-ST elevation myocardial infarction, CAD with prior bypass and known graft occlusion, post recent hip arthroplasty, hypertension, prior altered mental status  Assessment & Plan    Non-ST elevation myocardial infarction-troponin value is 17,390 -Plan discussed Dr. 62 with Dr. Graciela Husbands yesterday for heart catheterization. -Risks and benefits have been explained to him by Dr. Gala Romney to proceed. -IV heparin, aspirin, atorvastatin 80, Zetia, metoprolol, losartan, spironolactone  Recent hip arthroplasty -Postop myocardial infarction in the  setting of inflammatory response.  Mild anemia -Hemoglobin 9.6-stable.  Spoke to he and his wife. Called cath lab. Currently on add on list. Cath lab team will update as day goes on.    For questions or updates, please contact [facility] Please consult [email or web address] for contact info under        Signed, Silvio Clayman, MD  04/28/2020, 11:07 AM

## 2020-04-28 NOTE — Progress Notes (Signed)
Progress Note  Patient Name: [name] Date of Encounter: 04/28/2020  [facility] Cardiologist: No primary care provider on file.   Subjective   No chest pain no shortness of breath. Hip uncomfortable  Inpatient Medications    Scheduled Meds: . amLODipine  10 mg Oral Daily  . aspirin EC  81 mg Oral Daily  . atorvastatin  80 mg Oral Daily  . ezetimibe  10 mg Oral Daily  . losartan  100 mg Oral Daily  . metoprolol tartrate  50 mg Oral BID  . omega-3 acid ethyl esters  2,000 mg Oral Daily  . pantoprazole  40 mg Oral Daily  . polyethylene glycol  17 g Oral Daily  . senna-docusate  1 tablet Oral QHS  . sodium chloride flush  3 mL Intravenous Q12H  . spironolactone  25 mg Oral Daily  . tamsulosin  0.8 mg Oral QHS   Continuous Infusions: . sodium chloride    . heparin 1,050 Units/hr (04/28/20 0132)   PRN Meds: acetaminophen, nitroGLYCERIN, ondansetron (ZOFRAN) IV, ondansetron, oxyCODONE   Vital Signs    Vitals:   04/27/20 1148 04/27/20 2107 04/28/20 0608 04/28/20 0635  BP: (!) 135/55 (!) 145/72 (!) 175/64   Pulse: (!) 55 67 78   Resp: 18 20 18    Temp: 99 F (37.2 C) 98.6 F (37 C) 98.3 F (36.8 C)   TempSrc: Oral Oral Oral   SpO2: 96% 97% 97%   Weight:    88.6 kg  Height:        Intake/Output Summary (Last 24 hours) at 04/28/2020 1107 Last data filed at 04/28/2020 0636 Gross per 24 hour  Intake 171.3 ml  Output 1100 ml  Net -928.7 ml   Last 3 Weights 04/28/2020 04/25/2020 04/25/2020  Weight (lbs) 195 lb 6.4 oz 190 lb 190 lb  Weight (kg) 88.633 kg 86.183 kg 86.183 kg      Telemetry    No adverse arrhythmias- Personally Reviewed  ECG    Sinus rhythm with no ST elevation- Personally Reviewed  Physical Exam   GEN: No acute distress.   Neck: No JVD Cardiac: RRR, no murmurs, rubs, or gallops.  Respiratory: Clear to auscultation bilaterally. GI: Soft, nontender, non-distended  MS: No edema; postoperative changes Neuro:  Nonfocal  Psych:  Normal affect   Labs    High Sensitivity Troponin:   Recent Labs  Lab 04/25/20 1958 04/25/20 2148 04/26/20 1132  TROPONINIHS 111* 1,421* 17,390*      Chemistry Recent Labs  Lab 04/25/20 1846 04/25/20 1846 04/26/20 0442 04/27/20 0542 04/28/20 0521  NA 131*   < > 129* 134* 135  K 4.2   < > 4.0 4.3 4.3  CL 102   < > 101 100 104  CO2 20*   < > 21* 23 23  GLUCOSE 125*   < > 101* 118* 109*  BUN 22   < > 17 15 16   CREATININE 0.92   < > 0.85 0.82 0.82  CALCIUM 8.6*   < > 8.4* 8.6* 8.3*  PROT 6.6  --   --  6.1* 5.9*  ALBUMIN 3.3*  --   --  3.0* 2.7*  AST 35  --   --  92* 77*  ALT 18  --   --  36 50*  ALKPHOS 42  --   --  43 42  BILITOT 1.6*  --   --  1.7* 1.3*  GFRNONAA >60   < > >60 >60 >60  GFRAA >60   < > >60 >60 >60  ANIONGAP 9   < > 7 11 8   < > = values in this interval not displayed.     Hematology Recent Labs  Lab 04/26/20 0442 04/27/20 0542 04/28/20 0521  WBC 12.5* 11.9* 10.8*  RBC 2.94* 2.94* 2.78*  HGB 9.7* 9.6* 9.1*  HCT 28.6* 28.5* 27.0*  MCV 97.3 96.9 97.1  MCH 33.0 32.7 32.7  MCHC 33.9 33.7 33.7  RDW 13.0 12.7 12.7  PLT 219 235 242    BNP Recent Labs  Lab 04/25/20 2352  BNP 208.4*     DDimer No results for input(s): DDIMER in the last 168 hours.   Radiology    No results found.   Patient Profile     79 y.o. male with EF 40 to 45% with anterolateral and posterior wall hypokinesis with non-ST elevation myocardial infarction, CAD with prior bypass and known graft occlusion, post recent hip arthroplasty, hypertension, prior altered mental status  Assessment & Plan    Non-ST elevation myocardial infarction-troponin value is 17,390 -Plan discussed Dr. Klein with Dr. Bensimhon yesterday for heart catheterization. -Risks and benefits have been explained to him by Dr. Klein.-Willing to proceed. -IV heparin, aspirin, atorvastatin 80, Zetia, metoprolol, losartan, spironolactone  Recent hip arthroplasty -Postop myocardial infarction in the  setting of inflammatory response.  Mild anemia -Hemoglobin 9.6-stable.  Spoke to he and his wife. Called cath lab. Currently on add on list. Cath lab team will update as day goes on.    For questions or updates, please contact [facility] Please consult [email or web address] for contact info under        Signed, Cherree Conerly, MD  04/28/2020, 11:07 AM    

## 2020-04-28 NOTE — Progress Notes (Signed)
PROGRESS NOTE    [name]  VZS:[id number] DOB: April 17, 1941 DOA: 04/25/2020 PCP: Gaspar Garbe, MD  Brief Narrative:   43 white male Prior CAD status post multiple PCI in 1983-CABG X4 06/18/2011-last cardiac cath 05/04/2019 showing revascularization and noncritical stenosis and anatomy precluding PCI at that time Carotid bruit with last check 01/2017 R ICA 40-59% LICA 1-39% HTN HLD Right inguinal hernia successfully operated on 03/07/2020 Dr. Luisa Hart Hip arthroplasty 04/22/2020 Dr. 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 NSTEMI in the setting of cath 2020 showing no targets a. Going to [facility] today for cardiac cath 8/16-echo did not reveal wall motion abnormalities b. Current meds include losartan 100, metoprolol 50 twice daily, Aldactone 25, Lipitor 80, amlodipine 10 all being managed by cardiology c. Rest as per cardiologist 2. Toxic metabolic encephalopathy on admission a. Wife relates medications were "completely changed" when patient went home from recent surgery b. Would not treat 30,000 CFU Klebsiella and holding antibiotics at this time 3. Hip arthroplasty 04/22/2020 Dr. Dion Saucier a. I will CC his orthopedic surgeon on his recent readmission closer to discharge b. Currently Plavix and aspirin instead of aspirin 325 twice daily given above NSTEMI 4. Hyponatremia possible SIADH or secondary to tea toast  potomania a. Improved and completely resolved 5. Leukocytosis a. See discussion #2-downtrending on its own without any event 6. Mild transaminitis probably secondary to either fatty liver or infectious process or NSTEMI- a. Would simply trend as this is downtrending on its own 7. Hypertension a. Continuing amlodipine in addition to above medications b. Adjust meds as needed 8. Reflux 9. BPH a. Continue Flomax 0.4 b. See above discussions 10. HLD a. Goal LDL below 70 given risk factors-continue with high intensity atorvastatin 80, Zetia 10 and omega-3 fatty acids can be resumed  DVT prophylaxis: On IV heparin at this time Code Status: Full code Family Communication: Discussed with patient's wife briefly at the bedside H/16  Disposition:   Status is: Inpatient  Remains inpatient appropriate because:Hemodynamically unstable, Persistent severe electrolyte disturbances, Unsafe d/c plan and IV treatments appropriate due to intensity of illness or inability to take PO   Dispo: The patient is from: Home              Anticipated d/c is to: Home              Anticipated d/c date is: > 3 days              Patient currently is not medically stable to d/c.   Consultants:   Cardiology  Procedures:  Echocardiogram performed 8/14 IMPRESSIONS    1. Left ventricular ejection fraction, by estimation, is 40 to 45%. The  left ventricle has mildly decreased function. The left ventricle  demonstrates regional wall motion abnormalities (see scoring  diagram/findings for description). There is mild  concentric left ventricular hypertrophy. Left ventricular diastolic  parameters were normal.  2. Right ventricular systolic function is normal. The  right ventricular  size is normal. There is normal pulmonary artery systolic pressure. The  estimated right ventricular systolic pressure is 32.8 mmHg.  3. The mitral valve is grossly normal. No evidence of mitral valve  regurgitation. No evidence  of mitral stenosis.  4. The aortic valve is tricuspid. Aortic valve regurgitation is not  visualized. Mild to moderate aortic valve sclerosis/calcification is  present, without any evidence of aortic stenosis.  5. The inferior vena cava is normal in size with greater than 50%  respiratory variability, suggesting right atrial pressure of 3 mmHg.   Antimicrobials: None   Subjective: Awoke the patient from a dead sleep He has had no chest pain On the monitors this morning he is predominantly sinus rhythm, sinus bradycardia and he is ready to go for his catheterization whenever they can take him to:  Objective: Vitals:   04/27/20 1148 04/27/20 2107 04/28/20 0608 04/28/20 0635  BP: (!) 135/55 (!) 145/72 (!) 175/64   Pulse: (!) 55 67 78   Resp: 18 20 18    Temp: 99 F (37.2 C) 98.6 F (37 C) 98.3 F (36.8 C)   TempSrc: Oral Oral Oral   SpO2: 96% 97% 97%   Weight:    88.6 kg  Height:        Intake/Output Summary (Last 24 hours) at 04/28/2020 0843 Last data filed at 04/28/2020 0636 Gross per 24 hour  Intake 171.3 ml  Output 1350 ml  Net -1178.7 ml   Filed Weights   04/25/20 1829 04/25/20 1949 04/28/20 0635  Weight: 86.2 kg 86.2 kg 88.6 kg    Examination:  Pleasant coherent no distress S1-S2 no murmur Chest clear Abdomen soft no organomegaly No lower extremity edema  Data Reviewed: I have personally reviewed following labs and imaging studies Sodium 135 BUN/creatinine 16/0.8 AST/ALT 77/50 White count 10.8 Hemoglobin 9.1 Radiology Studies: ECHOCARDIOGRAM COMPLETE  Result Date: 04/26/2020    ECHOCARDIOGRAM REPORT   Patient Name:   [name] Date of Exam: 04/26/2020 Medical Rec #:  04/28/2020              Height:       67.0 in Accession #:    [id number]             Weight:       190.0 lb Date of Birth:  1941/06/07               BSA:          1.979 m Patient Age:    79 years               BP:           186/57 mmHg Patient Gender: M                      HR:            89 bpm. Exam Location:  Inpatient Procedure: 2D Echo Indications:    NSTEMI I21.4  History:        Patient has no prior history of Echocardiogram examinations.                 CAD, Prior CABG, Carotid Disease; Risk Factors:Hypertension and                 Dyslipidemia.  Sonographer:    03/17/1941 RDCS Referring Phys: Leta Jungling [id number] IMPRESSIONS  1. Left ventricular ejection fraction, by estimation, is 40 to 45%. The left  ventricle has mildly decreased function. The left ventricle demonstrates regional wall motion abnormalities (see scoring diagram/findings for description). There is mild concentric left ventricular hypertrophy. Left ventricular diastolic parameters were normal.  2. Right ventricular systolic function is normal. The right ventricular size is normal. There is normal pulmonary artery systolic pressure. The estimated right ventricular systolic pressure is 32.8 mmHg.  3. The mitral valve is grossly normal. No evidence of mitral valve regurgitation. No evidence of mitral stenosis.  4. The aortic valve is tricuspid. Aortic valve regurgitation is not visualized. Mild to moderate aortic valve sclerosis/calcification is present, without any evidence of aortic stenosis.  5. The inferior vena cava is normal in size with greater than 50% respiratory variability, suggesting right atrial pressure of 3 mmHg. FINDINGS  Left Ventricle: Left ventricular ejection fraction, by estimation, is 40 to 45%. The left ventricle has mildly decreased function. The left ventricle demonstrates regional wall motion abnormalities. The left ventricular internal cavity size was normal in size. There is mild concentric left ventricular hypertrophy. Left ventricular diastolic parameters were normal.  LV Wall Scoring: The antero-lateral wall and posterior wall are hypokinetic. Right Ventricle: The right ventricular size is normal. No increase in right ventricular wall thickness. Right ventricular systolic function is  normal. There is normal pulmonary artery systolic pressure. The tricuspid regurgitant velocity is 2.73 m/s, and  with an assumed right atrial pressure of 3 mmHg, the estimated right ventricular systolic pressure is 32.8 mmHg. Left Atrium: Left atrial size was normal in size. Right Atrium: Right atrial size was normal in size. Pericardium: Trivial pericardial effusion is present. Mitral Valve: The mitral valve is grossly normal. No evidence of mitral valve regurgitation. No evidence of mitral valve stenosis. Tricuspid Valve: The tricuspid valve is grossly normal. Tricuspid valve regurgitation is trivial. No evidence of tricuspid stenosis. Aortic Valve: The aortic valve is tricuspid. . There is moderate thickening and moderate calcification of the aortic valve. Aortic valve regurgitation is not visualized. Mild to moderate aortic valve sclerosis/calcification is present, without any evidence of aortic stenosis. There is moderate thickening of the aortic valve. There is moderate calcification of the aortic valve. Pulmonic Valve: The pulmonic valve was grossly normal. Pulmonic valve regurgitation is not visualized. No evidence of pulmonic stenosis. Aorta: The aortic root and ascending aorta are structurally normal, with no evidence of dilitation. Venous: The inferior vena cava is normal in size with greater than 50% respiratory variability, suggesting right atrial pressure of 3 mmHg. IAS/Shunts: The atrial septum is grossly normal.  LEFT VENTRICLE PLAX 2D LVIDd:         5.20 cm  Diastology LVIDs:         3.90 cm  LV e' lateral:   15.80 cm/s LV PW:         1.10 cm  LV E/e' lateral: 6.5 LV IVS:        1.50 cm  LV e' medial:    8.92 cm/s LVOT diam:     2.20 cm  LV E/e' medial:  11.5 LV SV:         77 LV SV Index:   39 LVOT Area:     3.80 cm  RIGHT VENTRICLE RV S prime:     10.20 cm/s TAPSE (M-mode): 1.8 cm LEFT ATRIUM             Index       RIGHT ATRIUM           Index LA diam:  4.00 cm 2.02 cm/m  RA Area:      14.20 cm LA Vol (A2C):   51.4 ml 25.98 ml/m RA Volume:   36.40 ml  18.40 ml/m LA Vol (A4C):   40.9 ml 20.67 ml/m LA Biplane Vol: 46.2 ml 23.35 ml/m  AORTIC VALVE LVOT Vmax:   110.00 cm/s LVOT Vmean:  65.600 cm/s LVOT VTI:    0.202 m  AORTA Ao Root diam: 3.60 cm MITRAL VALVE                TRICUSPID VALVE MV Area (PHT): 5.88 cm     TR Peak grad:   29.8 mmHg MV Decel Time: 129 msec     TR Vmax:        273.00 cm/s MV E velocity: 103.00 cm/s MV A velocity: 90.60 cm/s   SHUNTS MV E/A ratio:  1.14         Systemic VTI:  0.20 m                             Systemic Diam: 2.20 cm Lennie Odor MD Electronically signed by Lennie Odor MD Signature Date/Time: 04/26/2020/10:46:51 AM    Final      Scheduled Meds: . amLODipine  10 mg Oral Daily  . aspirin EC  81 mg Oral Daily  . atorvastatin  80 mg Oral Daily  . ezetimibe  10 mg Oral Daily  . losartan  100 mg Oral Daily  . metoprolol tartrate  50 mg Oral BID  . omega-3 acid ethyl esters  2,000 mg Oral Daily  . pantoprazole  40 mg Oral Daily  . polyethylene glycol  17 g Oral Daily  . senna-docusate  1 tablet Oral QHS  . sodium chloride flush  3 mL Intravenous Q12H  . spironolactone  25 mg Oral Daily  . tamsulosin  0.8 mg Oral QHS   Continuous Infusions: . sodium chloride    . heparin 1,050 Units/hr (04/28/20 0132)     LOS: 3 days    Time spent: 15 minutes  Rhetta Mura, MD [facility] To contact the attending provider between 7A-7P or the covering provider during after hours 7P-7A, please log into the web site [email or web address] and access using universal [place] password for that web site. If you do not have the password, please call the hospital operator.  04/28/2020, 8:43 AM

## 2020-04-28 NOTE — Progress Notes (Signed)
Pt off unit via stretcher with [facility]. A&O x4 no s/s of acute distress or complaints voiced. Spouse at bedside to follow

## 2020-04-28 NOTE — Progress Notes (Signed)
Pt arrives back to unit A&O x4 spouse at bedside. Left groin bandage clean dry intact.

## 2020-04-28 NOTE — Interval H&P Note (Signed)
Cath Lab Visit (complete for each Cath Lab visit)  Clinical Evaluation Leading to the Procedure:   ACS: Yes.    Non-ACS:    Anginal Classification: CCS [name]  Anti-ischemic medical therapy: Maximal Therapy (2 or more classes of medications)  Non-Invasive Test Results: No non-invasive testing performed  Prior CABG: Previous CABG      History and Physical Interval Note:  04/28/2020 3:15 PM  [name]  has presented today for surgery, with the diagnosis of NSTEMI.  The various methods of treatment have been discussed with the patient and family. After consideration of risks, benefits and other options for treatment, the patient has consented to  Procedure(s): LEFT HEART CATH AND CORS/GRAFTS ANGIOGRAPHY (N/A) as a surgical intervention.  The patient's history has been reviewed, patient examined, no change in status, stable for surgery.  I have reviewed the patient's chart and labs.  Questions were answered to the patient's satisfaction.     Nanetta Batty

## 2020-04-28 NOTE — Care Management Important Message (Signed)
Important Message  Patient Details IM Letter given to the Patient Name: [name] MRN: [id number] Date of Birth: 01-31-1941   Medicare Important Message Given:  Yes     [name] 04/28/2020, 11:57 AM

## 2020-04-28 NOTE — Progress Notes (Signed)
ANTICOAGULATION CONSULT NOTE  Pharmacy Consult for Heparin Indication: chest pain/ACS  Allergies  Allergen Reactions  . Morphine Nausea Only    Patient Measurements: Height: 5\' 7"  (170.2 cm) Weight: 88.6 kg (195 lb 6.4 oz) IBW/kg (Calculated) : 66.1 Heparin Dosing Weight: 84.4 kg  Vital Signs: Temp: 98.3 F (36.8 C) (08/16 0608) Temp Source: Oral (08/16 0608) BP: 175/64 (08/16 0608) Pulse Rate: 78 (08/16 0608)  Labs: Recent Labs    04/25/20 1846 04/25/20 1846 04/25/20 1958 04/25/20 2148 04/26/20 0442 04/26/20 0442 04/26/20 1132 04/26/20 1132 04/26/20 1927 04/27/20 0542 04/28/20 0521  HGB 9.9*   < >  --   --  9.7*   < >  --   --   --  9.6* 9.1*  HCT 29.7*   < >  --   --  28.6*  --   --   --   --  28.5* 27.0*  PLT 213   < >  --   --  219  --   --   --   --  235 242  APTT 30  --   --   --   --   --   --   --   --   --   --   LABPROT 12.7  --   --   --  12.8  --   --   --   --   --   --   INR 1.0  --   --   --  1.0  --   --   --   --   --   --   HEPARINUNFRC  --   --   --   --   --   --  0.51   < > 0.36 0.50 0.56  CREATININE 0.92   < >  --   --  0.85  --   --   --   --  0.82 0.82  TROPONINIHS  --   --  111* 1,421*  --   --  17,390*  --   --   --   --    < > = values in this interval not displayed.    Estimated Creatinine Clearance: 80.1 mL/min (by C-G formula based on SCr of 0.82 mg/dL).  Assessment: 23 y/oM s/p recent right total hip arthroplasty admitted for NSTEMI. Pharmacy consulted for IV heparin dosing. Patient not on oral anticoagulants PTA. Baseline coags WNL.   Today, 04/28/20:   Heparin level = 0.56 units/mL, remains therapeutic  CBC: Hgb low/decreased to 9.1, Pltc WNL   No bleeding or infusion issues noted per nursing  Goal of Therapy:  Heparin level 0.3-0.7 units/ml Monitor platelets by anticoagulation protocol: Yes   Plan:  Continue heparin infusion at 1050 units/hr Daily CBC, heparin level Plan for cardiac cath later today  04/30/20, PharmD, BCPS Clinical Pharmacist  04/28/2020 7:27 AM

## 2020-04-28 NOTE — Plan of Care (Signed)

## 2020-04-28 NOTE — Progress Notes (Signed)
Patient arrived via [facility]. No pain. A&ox4 Consent signed at bedside at NS started.  Contact measures in place for MRSA

## 2020-04-28 NOTE — Progress Notes (Signed)
Pt is being transported back to [facility] with [facility].  Report was called by Troy Sine, RN.

## 2020-04-29 ENCOUNTER — Encounter (HOSPITAL_COMMUNITY): Payer: Self-pay | Admitting: Cardiovascular Disease

## 2020-04-29 MED ORDER — METOPROLOL TARTRATE 50 MG PO TABS: 50.0000 mg | ORAL_TABLET | Freq: Two times a day (BID) | ORAL | 0 refills | Status: DC

## 2020-04-29 NOTE — Discharge Summary (Signed)
Physician Discharge Summary  [name] PuckerGeorge Albert Mahaffy [name] ZOX:[id number] DOB: 06/26/1941 DOA: 04/25/2020  PCP: Gaspar Garbeisovec, Richard W, MD  Admit date: 04/25/2020 Discharge date: 04/29/2020  Time spent: 40 minutes  Recommendations for Outpatient Follow-up:  1. New medication metoprolol 2. Outpatient follow-up as per cardiology who will see patient prior to discharge 3. No more baclofen 4. Outpatient follow-up with Dr. Dion SaucierLandau of orthopedics 5. Is cleared for any other procedure-hernia surgery can be performed with moderate risk-we will CC Dr. Luisa Hartornett as this is on the cards 6. Needs CBC and basic metabolic panel in about 1 week at PCP office 7. 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 Luisa Hartornett Hip arthroplasty 04/22/2020 Dr. Dion SaucierLandau Return to ED 8/13 metabolic encephalopathy per family history could not be obtained- CT head showing only atrophy no acute infarct, CXR negative concerning signs = troponins 111-->1421 Sodium 129 BUNs/creatinine 17/0.8 BNP  208 White count 15 trending down to 12.5  Hospital Course:  1. NSTEMI in the setting of cath 2020 showing no targets a. Cardiac cath 8/16 as below with no targets for revascularization and medical management b. Current meds include losartan 100, this admission was added metoprolol 50 twice daily which patient should continue c. Resumed on discharge Aldactone 25, Lipitor 80, amlodipine 10 all being managed by cardiology d. Rest as per cardiologist 2. Toxic metabolic encephalopathy on admission a. Wife relates medications were "completely changed" when patient went home from recent surgery b. Would not treat 30,000 CFU Klebsiella as asymptomatic and holding antibiotics at this time 3. Hip arthroplasty 04/22/2020 Dr. Dion SaucierLandau a. Follows with Dr. Dion SaucierLandau next week b. Currently Plavix and aspirin instead of aspirin 325 twice daily given above NSTEMI 4. Hyponatremia possible SIADH or secondary to tea toast potomania a. Improved and completely resolved 5. Leukocytosis a. See discussion #2-downtrending on its own without any event 6. Mild transaminitis probably secondary to either fatty liver or infectious process or NSTEMI- a. Would simply trend as this is downtrending on its own 7. Hypertension a. Continuing amlodipine in addition to above medications b. Adjust meds as needed 8. Reflux 9. BPH a. Continue Flomax 0.4 b. See above discussions 10. HLD 1. Goal LDL below 70 given risk factors-continue with high intensity atorvastatin 80, Zetia 10 and omega-3 fatty acids can be resume  Procedures: Cardiac cath 8/16 Consultations:  Cardiology  Discharge Exam: Vitals:   04/28/20 2116 04/29/20 0613  BP: (!) 155/54 (!) 158/72  Pulse: 74 70  Resp: 20 18  Temp: 98.5 F (36.9 C) 98.7 F (37.1 C)  SpO2: 97% 99%    General: Awake  alert slightly emotional no distress EOMI NCAT no focal deficit Cardiovascular: S1-S2 no murmur rub or gallop  Respiratory: clear no added sound no rales no  rhonchi Abdomen soft no rebound no guarding ROM intact moving all 4 limbs Neurologically intact  Discharge Instructions   Discharge Instructions    Diet - low sodium heart healthy   Complete by: As directed    Discharge instructions   Complete by: As directed    You will notice that several medications might have been tweaked this hospital stay you will be starting on metoprolol which we will call in which is a heart medication NO MORE BACLOFEN--this caused the confusion that you had on admission- It is very unlikely you have urinary infection you are not symptomatic and you have not presented with concerns for the same from my perspective-please follow-up in the outpatient setting for your prostate issues Also you will need to follow their instructions from the cardiologist who have seen you and monitor your groin because you just had a cardiac catheterization and follow their instructions We will see if you can get outpatient therapy You will need labs in about 1 week and follow-up with your outpatient physicians and cardiology should be arranging a follow-up appointment for you   Increase activity slowly   Complete by: As directed    No wound care   Complete by: As directed      Allergies as of 04/29/2020      Reactions   Morphine Nausea Only      Medication List    STOP taking these medications   baclofen 10 MG tablet Commonly known as: LIORESAL     TAKE these medications   amLODipine 10 MG tablet Commonly known as: NORVASC TAKE 1 TABLET ONCE DAILY.   aspirin EC 325 MG tablet Take 1 tablet (325 mg total) by mouth 2 (two) times daily.   atorvastatin 80 MG tablet Commonly known as: LIPITOR TAKE ONE TABLET BY MOUTH DAILY   ezetimibe 10 MG tablet Commonly known as: ZETIA TAKE 1 TABLET BY MOUTH DAILY.   Fish Oil 1000 MG Caps Take 2,000 mg by mouth daily.   losartan 100 MG tablet Commonly known as: COZAAR Take 1 tablet (100 mg total) by mouth daily. NEEDS APPT    metoprolol tartrate 50 MG tablet Commonly known as: LOPRESSOR Take 1 tablet (50 mg total) by mouth 2 (two) times daily.   nitroGLYCERIN 0.4 MG SL tablet Commonly known as: NITROSTAT Place 0.4 mg under the tongue every 5 (five) minutes as needed for chest pain.   ondansetron 4 MG tablet Commonly known as: Zofran Take 1 tablet (4 mg total) by mouth every 8 (eight) hours as needed for nausea or vomiting.   oxyCODONE 5 MG immediate release tablet Commonly known as: Roxicodone Take 1 tablet (5 mg total) by mouth every 4 (four) hours as needed for severe pain.   pantoprazole 40 MG tablet Commonly known as: PROTONIX TAKE 1 TABLET ONCE DAILY.   sennosides-docusate sodium 8.6-50 MG tablet Commonly known as: SENOKOT-S Take 2 tablets by mouth daily.   spironolactone 25 MG tablet Commonly known as: ALDACTONE TAKE (1/2) TABLET DAILY. What changed: See the new instructions.   tamsulosin 0.4 MG Caps capsule Commonly known as: FLOMAX Take 0.8 mg by mouth at bedtime.      Allergies  Allergen Reactions  . Morphine Nausea Only      The results of significant diagnostics from this hospitalization (including imaging, microbiology, ancillary and laboratory) are  listed below for reference.    Significant Diagnostic Studies: CT Head Wo Contrast  Result Date: 04/25/2020 CLINICAL DATA:  Delirium EXAM: CT HEAD WITHOUT CONTRAST TECHNIQUE: Contiguous axial images were obtained from the base of the skull through the vertex without intravenous contrast. COMPARISON:  None. FINDINGS: Brain: No acute territorial infarction, hemorrhage or intracranial mass. Mild atrophy. Mild hypodensity in the white matter consistent with chronic small vessel ischemic change. Nonenlarged ventricles. Vascular: No hyperdense vessels.  Carotid vascular calcifications. Skull: Normal. Negative for fracture or focal lesion. Sinuses/Orbits: No acute finding. Other: None IMPRESSION: 1. No CT evidence for acute intracranial  abnormality. 2. Atrophy and mild chronic small vessel ischemic changes of the white matter. Electronically Signed   By: Jasmine Pang M.D.   On: 04/25/2020 20:40   CARDIAC CATHETERIZATION  Result Date: 04/28/2020  Ost RCA to Prox RCA lesion is 100% stenosed.  Origin to Prox Graft lesion is 100% stenosed.  Origin to Prox Graft lesion is 100% stenosed.  Odies Desa [name] is a 79 y.o. male  [id number] LOCATION:  FACILITY: [facility] PHYSICIAN: Nanetta Batty, M.D. Mar 17, 1941 DATE OF PROCEDURE:  04/28/2020 DATE OF DISCHARGE: CARDIAC CATHETERIZATION History obtained from chart review.79 y.o. male with EF 40 to 45% with anterolateral and posterior wall hypokinesis with non-ST elevation myocardial infarction, CAD with prior bypass and known graft occlusion, post recent hip arthroplasty, hypertension, prior altered mental status.  He had cardiac catheterization performed a year ago by Dr. Gala Romney revealing 2 of the 4 grafts occluded.  The vein to the PDA was patent as was the LIMA to the LAD.  His troponins rose to 17,000.  His EF is in the 40 to 45% range.  He recently had a right hip arthroplasty.  He presents now for diagnostic coronary angiography via left femoral approach.   Mr. Lamp has unchanged anatomy from the diagnostic cath performed by Dr. Gala Romney 1 year ago.  The vein to his PDA and LIMA to his LAD were patent.  His native anatomy otherwise is unchanged with an occluded dominant RCA.  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  The circumflex was patent as was the LAD.  There is no culprit lesion identified.  A left femoral angiogram was performed and a Mynx closure device was successfully deployed achieving hemostasis.  The patient left lab in stable condition. Nanetta Batty. MD, [facility] 04/28/2020 3:58 PM   DG Pelvis Portable  Result Date: 04/22/2020 CLINICAL DATA:  Right hip arthroplasty today. EXAM: PORTABLE PELVIS 1-2 VIEWS COMPARISON:  Concurrent  hip exam. FINDINGS: Recent right hip arthroplasty in expected alignment. No periprosthetic lucency. Distal aspect of the femoral stem is included on concurrent right hip exam, reported separately. Recent postsurgical change includes air and edema in the soft tissues. Left hip arthroplasty is intact where visualized. IMPRESSION: Recent right hip arthroplasty in expected alignment. No immediate postoperative complication. Electronically Signed   By: Narda Rutherford M.D.   On: 04/22/2020 16:21   DG Chest Port 1 View  Result Date: 04/25/2020 CLINICAL DATA:  Possible sepsis EXAM: PORTABLE CHEST 1 VIEW COMPARISON:  07/19/2011 FINDINGS: Post sternotomy changes. No focal opacity or pleural effusion. Aortic atherosclerosis. No pneumothorax. IMPRESSION: No active disease. Electronically Signed   By: Jasmine Pang M.D.   On: 04/25/2020 19:44   ECHOCARDIOGRAM COMPLETE  Result Date: 04/26/2020    ECHOCARDIOGRAM REPORT   Patient Name:   Dorris Vangorder [name] Date of Exam: 04/26/2020  Medical Rec #:  [id number]              Height:       67.0 in Accession #:    [id number]             Weight:       190.0 lb Date of Birth:  14-Jun-1941               BSA:          1.979 m Patient Age:    79 years               BP:           186/57 mmHg Patient Gender: M                      HR:           89 bpm. Exam Location:  Inpatient Procedure: 2D Echo Indications:    NSTEMI I21.4  History:        Patient has no prior history of Echocardiogram examinations.                 CAD, Prior CABG, Carotid Disease; Risk Factors:Hypertension and                 Dyslipidemia.  Sonographer:    Leta Jungling RDCS Referring Phys: [id number] Sunnie Nielsen IMPRESSIONS  1. Left ventricular ejection fraction, by estimation, is 40 to 45%. The left ventricle has mildly decreased function. The left ventricle demonstrates regional wall motion abnormalities (see scoring diagram/findings for description). There is mild concentric left ventricular hypertrophy.  Left ventricular diastolic parameters were normal.  2. Right ventricular systolic function is normal. The right ventricular size is normal. There is normal pulmonary artery systolic pressure. The estimated right ventricular systolic pressure is 32.8 mmHg.  3. The mitral valve is grossly normal. No evidence of mitral valve regurgitation. No evidence of mitral stenosis.  4. The aortic valve is tricuspid. Aortic valve regurgitation is not visualized. Mild to moderate aortic valve sclerosis/calcification is present, without any evidence of aortic stenosis.  5. The inferior vena cava is normal in size with greater than 50% respiratory variability, suggesting right atrial pressure of 3 mmHg. FINDINGS  Left Ventricle: Left ventricular ejection fraction, by estimation, is 40 to 45%. The left ventricle has mildly decreased function. The left ventricle demonstrates regional wall motion abnormalities. The left ventricular internal cavity size was normal in size. There is mild concentric left ventricular hypertrophy. Left ventricular diastolic parameters were normal.  LV Wall Scoring: The antero-lateral wall and posterior wall are hypokinetic. Right Ventricle: The right ventricular size is normal. No increase in right ventricular wall thickness. Right ventricular systolic function is normal. There is normal pulmonary artery systolic pressure. The tricuspid regurgitant velocity is 2.73 m/s, and  with an assumed right atrial pressure of 3 mmHg, the estimated right ventricular systolic pressure is 32.8 mmHg. Left Atrium: Left atrial size was normal in size. Right Atrium: Right atrial size was normal in size. Pericardium: Trivial pericardial effusion is present. Mitral Valve: The mitral valve is grossly normal. No evidence of mitral valve regurgitation. No evidence of mitral valve stenosis. Tricuspid Valve: The tricuspid valve is grossly normal. Tricuspid valve regurgitation is trivial. No evidence of tricuspid stenosis. Aortic  Valve: The aortic valve is tricuspid. . There is moderate thickening and moderate calcification of the aortic valve. Aortic valve regurgitation is not visualized. Mild to moderate aortic valve sclerosis/calcification is  present, without any evidence of aortic stenosis. There is moderate thickening of the aortic valve. There is moderate calcification of the aortic valve. Pulmonic Valve: The pulmonic valve was grossly normal. Pulmonic valve regurgitation is not visualized. No evidence of pulmonic stenosis. Aorta: The aortic root and ascending aorta are structurally normal, with no evidence of dilitation. Venous: The inferior vena cava is normal in size with greater than 50% respiratory variability, suggesting right atrial pressure of 3 mmHg. IAS/Shunts: The atrial septum is grossly normal.  LEFT VENTRICLE PLAX 2D LVIDd:         5.20 cm  Diastology LVIDs:         3.90 cm  LV e' lateral:   15.80 cm/s LV PW:         1.10 cm  LV E/e' lateral: 6.5 LV IVS:        1.50 cm  LV e' medial:    8.92 cm/s LVOT diam:     2.20 cm  LV E/e' medial:  11.5 LV SV:         77 LV SV Index:   39 LVOT Area:     3.80 cm  RIGHT VENTRICLE RV S prime:     10.20 cm/s TAPSE (M-mode): 1.8 cm LEFT ATRIUM             Index       RIGHT ATRIUM           Index LA diam:        4.00 cm 2.02 cm/m  RA Area:     14.20 cm LA Vol (A2C):   51.4 ml 25.98 ml/m RA Volume:   36.40 ml  18.40 ml/m LA Vol (A4C):   40.9 ml 20.67 ml/m LA Biplane Vol: 46.2 ml 23.35 ml/m  AORTIC VALVE LVOT Vmax:   110.00 cm/s LVOT Vmean:  65.600 cm/s LVOT VTI:    0.202 m  AORTA Ao Root diam: 3.60 cm MITRAL VALVE                TRICUSPID VALVE MV Area (PHT): 5.88 cm     TR Peak grad:   29.8 mmHg MV Decel Time: 129 msec     TR Vmax:        273.00 cm/s MV E velocity: 103.00 cm/s MV A velocity: 90.60 cm/s   SHUNTS MV E/A ratio:  1.14         Systemic VTI:  0.20 m                             Systemic Diam: 2.20 cm Lennie Odor MD Electronically signed by Lennie Odor MD Signature  Date/Time: 04/26/2020/10:46:51 AM    Final    DG Hip Port Unilat With Pelvis 1V Right  Result Date: 04/22/2020 CLINICAL DATA:  Status post right hip arthroplasty. EXAM: DG HIP (WITH OR WITHOUT PELVIS) 1V PORT RIGHT COMPARISON:  None. FINDINGS: Right hip arthroplasty in expected alignment. No periprosthetic lucency or fracture. Recent postsurgical change includes air and edema in the soft tissues and joint space. IMPRESSION: Right hip arthroplasty without immediate postoperative complication. Electronically Signed   By: Narda Rutherford M.D.   On: 04/22/2020 16:19   VAS US CAROTID  Result Date: 04/08/2020 Carotid Arterial Duplex Study Indications:       Carotid artery disease , pre-operative exam. Risk Factors:      Hypertension, hyperlipidemia, coronary artery disease. Comparison Study:  Increased ICA velocity bilaterally Performing Technologist:  Elita Quick RVT  Examination Guidelines: A complete evaluation includes B-mode imaging, spectral Doppler, color Doppler, and power Doppler as needed of all accessible portions of each vessel. Bilateral testing is considered an integral part of a complete examination. Limited examinations for reoccurring indications may be performed as noted.  Right Carotid Findings: +----------+--------+--------+--------+-------------------------+--------+           PSV cm/sEDV cm/sStenosisPlaque Description       Comments +----------+--------+--------+--------+-------------------------+--------+ CCA Prox  106     9                                                 +----------+--------+--------+--------+-------------------------+--------+ CCA Mid   86      14              heterogenous                      +----------+--------+--------+--------+-------------------------+--------+ CCA Distal102     20              heterogenous                      +----------+--------+--------+--------+-------------------------+--------+ ICA Prox  336     73      60-79%   heterogenous and calcific         +----------+--------+--------+--------+-------------------------+--------+ ICA Mid   117     29                                                +----------+--------+--------+--------+-------------------------+--------+ ICA Distal104     23                                                +----------+--------+--------+--------+-------------------------+--------+ ECA       542     12      >50%                                      +----------+--------+--------+--------+-------------------------+--------+ +----------+--------+-------+----------------+-------------------+           PSV cm/sEDV cmsDescribe        Arm Pressure (mmHG) +----------+--------+-------+----------------+-------------------+ Subclavian202     4      Multiphasic, WNL                    +----------+--------+-------+----------------+-------------------+ +---------+--------+--+--------+--+---------+ VertebralPSV cm/s86EDV cm/s18Antegrade +---------+--------+--+--------+--+---------+  Left Carotid Findings: +----------+--------+--------+--------+------------------+--------+           PSV cm/sEDV cm/sStenosisPlaque DescriptionComments +----------+--------+--------+--------+------------------+--------+ CCA Prox  127     14                                         +----------+--------+--------+--------+------------------+--------+ CCA Mid   101     15              heterogenous               +----------+--------+--------+--------+------------------+--------+ CCA [id number]  18              heterogenous               +----------+--------+--------+--------+------------------+--------+ ICA Prox  343     75      60-79%  calcific                   +----------+--------+--------+--------+------------------+--------+ ICA Mid   256     47                                         +----------+--------+--------+--------+------------------+--------+ ICA  Distal96      19                                         +----------+--------+--------+--------+------------------+--------+ ECA       153     4                                          +----------+--------+--------+--------+------------------+--------+ +----------+--------+--------+----------------+-------------------+           PSV cm/sEDV cm/sDescribe        Arm Pressure (mmHG) +----------+--------+--------+----------------+-------------------+ [id number]     8       Multiphasic, WNL                    +----------+--------+--------+----------------+-------------------+ +---------+--------+---+--------+--+---------+ VertebralPSV cm/s100EDV cm/s21Antegrade +---------+--------+---+--------+--+---------+   Summary: Right Carotid: Velocities in the right ICA are consistent with a 60-79%                stenosis. Left Carotid: Velocities in the left ICA are consistent with a 60-79% stenosis. Vertebrals: Bilateral vertebral arteries demonstrate antegrade flow.              NOTE: Calcific plaque may obscure higher velocity. *See table(s) above for measurements and observations.  Electronically signed by Gretta Began MD on 04/08/2020 at 10:09:07 AM.    Final     Microbiology: Recent Results (from the past 240 hour(s))  Blood Culture (routine x 2)     Status: None (Preliminary result)   Collection Time: 04/25/20  6:47 PM   Specimen: BLOOD LEFT HAND  Result Value Ref Range Status   Specimen Description   Final    BLOOD LEFT HAND Performed at [facility], [address], [place], [place] [address]    Special Requests   Final    BOTTLES DRAWN AEROBIC AND ANAEROBIC Blood Culture adequate volume Performed at [facility], [address] [place], [place] [address]    Culture   Final    NO GROWTH 4 DAYS Performed at [facility], [address] [place], [place] [address]    Report Status PENDING  Incomplete  Blood Culture (routine  x 2)     Status: None (Preliminary result)   Collection Time: 04/25/20  6:50 PM   Specimen: BLOOD RIGHT HAND  Result Value Ref Range Status   Specimen Description   Final    BLOOD RIGHT HAND Performed at [facility], [address], [place], [place] [address]    Special Requests   Final    BOTTLES DRAWN AEROBIC AND ANAEROBIC Blood Culture results  may not be optimal due to an inadequate volume of blood received in culture bottles Performed at [facility], [address], [place], [place] [address]    Culture   Final    NO GROWTH 4 DAYS Performed at [facility], [address] [place], [place] [address]    Report Status PENDING  Incomplete  Urine culture     Status: Abnormal   Collection Time: 04/25/20  7:58 PM   Specimen: In/Out Cath Urine  Result Value Ref Range Status   Specimen Description   Final    IN/OUT CATH URINE Performed at [facility], [address], [place], [place] [address]    Special Requests   Final    NONE Performed at [facility], [address] [place], [place] [address]    Culture (A)  Final    30,000 COLONIES/mL KLEBSIELLA PNEUMONIAE Confirmed Extended Spectrum Beta-Lactamase Producer (ESBL).  In bloodstream infections from ESBL organisms, carbapenems are preferred over piperacillin/tazobactam. They are shown to have a lower risk of mortality.    Report Status 04/28/2020 FINAL  Final   Organism ID, Bacteria KLEBSIELLA PNEUMONIAE (A)  Final      Susceptibility   Klebsiella pneumoniae - MIC*    AMPICILLIN >=32 RESISTANT Resistant     CEFAZOLIN >=64 RESISTANT Resistant     CEFTRIAXONE >=64 RESISTANT Resistant     CIPROFLOXACIN 0.5 SENSITIVE Sensitive     GENTAMICIN <=1 SENSITIVE Sensitive     IMIPENEM <=0.25 SENSITIVE Sensitive     NITROFURANTOIN 64 INTERMEDIATE Intermediate     TRIMETH/SULFA <=20 SENSITIVE Sensitive     AMPICILLIN/SULBACTAM >=32 RESISTANT Resistant      PIP/TAZO <=4 SENSITIVE Sensitive     * 30,000 COLONIES/mL KLEBSIELLA PNEUMONIAE  SARS Coronavirus 2 by RT PCR (hospital order, performed in [facility] hospital lab) Nasopharyngeal     Status: None   Collection Time: 04/25/20  7:58 PM   Specimen: Nasopharyngeal  Result Value Ref Range Status   SARS Coronavirus 2 NEGATIVE NEGATIVE Final    Comment: (NOTE) SARS-CoV-2 target nucleic acids are NOT DETECTED.  The SARS-CoV-2 RNA is generally detectable in upper and lower respiratory specimens during the acute phase of infection. The lowest concentration of SARS-CoV-2 viral copies this assay can detect is 250 copies / mL. A negative result does not preclude SARS-CoV-2 infection and should not be used as the sole basis for treatment or other patient management decisions.  A negative result may occur with improper specimen collection / handling, submission of specimen other than nasopharyngeal swab, presence of viral mutation(s) within the areas targeted by this assay, and inadequate number of viral copies (<250 copies / mL). A negative result must be combined with clinical observations, patient history, and epidemiological information.  Fact Sheet for Patients:   BoilerBrush.com.cy  Fact Sheet for Healthcare Providers: https://pope.com/  This test is not yet approved or  cleared by the [place] FDA and has been authorized for detection and/or diagnosis of SARS-CoV-2 by FDA under an Emergency Use Authorization (EUA).  This EUA will remain in effect (meaning this test can be used) for the duration of the COVID-19 declaration under Section 564(b)(1) of the Act, 21 U.S.C. section 360bbb-3(b)(1), unless the authorization is terminated or revoked sooner.  Performed at [facility], [address], [place], [place] [address]      Labs: Basic Metabolic Panel: Recent Labs  Lab 04/23/20 0249 04/25/20 1846  04/26/20 2563 04/27/20 0542  04/28/20 0521  NA 132* 131* 129* 134* 135  K 4.8 4.2 4.0 4.3 4.3  CL 102 102 101 100 104  CO2 22 20* 21* 23 23  GLUCOSE 179* 125* 101* 118* 109*  BUN CREATININE 1.01 0.92 0.85 0.82 0.82  CALCIUM 8.9 8.6* 8.4* 8.6* 8.3*   Liver Function Tests: Recent Labs  Lab 04/25/20 1846 04/27/20 0542 04/28/20 0521  AST 35 92* 77*  ALT 18 36 50*  ALKPHOS 42 43 42  BILITOT 1.6* 1.7* 1.3*  PROT 6.6 6.1* 5.9*  ALBUMIN 3.3* 3.0* 2.7*   No results for input(s): LIPASE, AMYLASE in the last 168 hours. No results for input(s): AMMONIA in the last 168 hours. CBC: Recent Labs  Lab 04/23/20 0249 04/25/20 1846 04/26/20 0442 04/27/20 0542 04/28/20 0521  WBC 13.3* 15.4* 12.5* 11.9* 10.8*  NEUTROABS  --  12.3*  --   --   --   HGB 11.1* 9.9* 9.7* 9.6* 9.1*  HCT 32.7* 29.7* 28.6* 28.5* 27.0*  MCV 97.6 97.4 97.3 96.9 97.1  PLT 213 213 219 235 242   Cardiac Enzymes: No results for input(s): CKTOTAL, CKMB, CKMBINDEX, TROPONINI in the last 168 hours. BNP: BNP (last 3 results) Recent Labs    04/25/20 2352  BNP 208.4*    ProBNP (last 3 results) No results for input(s): PROBNP in the last 8760 hours.  CBG: Recent Labs  Lab 04/25/20 1836  GLUCAP 121*       Signed:  Rhetta Mura MD   [facility] 04/29/2020, 9:40 AM

## 2020-04-29 NOTE — Discharge Instructions (Signed)
Heart Attack A heart attack occurs when blood and oxygen supply to the heart is cut off. A heart attack causes damage to the heart that cannot be fixed. A heart attack is also called a myocardial infarction, or MI. If you think you are having a heart attack, do not wait to see if the symptoms will go away. Get medical help right away. What are the causes? This condition may be caused by:  A fatty substance (plaque) in the blood vessels (arteries). This can block the flow of blood to the heart.  A blood clot in the blood vessels that go to the heart. The blood clot blocks blood flow.  Low blood pressure.  An abnormal heartbeat.  Some diseases, such as problems in red blood cells (anemia)orproblems in breathing (respiratory failure).  Tightening (spasm) of a blood vessel that cuts off blood to the heart.  A tear in a blood vessel of the heart.  High blood pressure. What increases the risk? The following factors may make you more likely to develop this condition:  Aging. The older you are, the higher your risk.  Having a personal or family history of chest pain, heart attack, stroke, or narrowing of the arteries in the legs, arms, head, or stomach (peripheral artery disease).  Being male.  Smoking.  Not getting regular exercise.  Being overweight or obese.  Having high blood pressure.  Having high cholesterol.  Having diabetes.  Drinking too much alcohol.  Using illegal drugs, such as cocaine or methamphetamine. What are the signs or symptoms? Symptoms of this condition include:  Chest pain. It may feel like: ? Crushing or squeezing. ? Tightness, pressure, fullness, or heaviness.  Pain in the arm, neck, jaw, back, or upper body.  Shortness of breath.  Heartburn.  Upset stomach (indigestion).  Feeling like you may vomit (nauseous).  Cold sweats.  Feeling tired.  Sudden light-headedness. How is this treated? A heart attack must be treated as soon as  possible. Treatment may include:  Medicines to: ? Break up or dissolve blood clots. ? Thin blood and help prevent blood clots. ? Treat blood pressure. ? Improve blood flow to the heart. ? Reduce pain. ? Reduce cholesterol.  Procedures to widen a blocked artery and keep it open.  Open heart surgery.  Receiving oxygen.  Making your heart strong again (cardiac rehabilitation) through exercise, education, and counseling. Follow these instructions at home: Medicines  Take over-the-counter and prescription medicines only as told by your doctor. You may need to take medicine: ? To keep your blood from clotting too easily. ? To control blood pressure. ? To lower cholesterol. ? To control heart rhythms.  Do not take these medicines unless your doctor says it is okay: ? NSAIDs, such as ibuprofen. ? Supplements that have vitamin A, vitamin E, or both. ? Hormone replacement therapy that has estrogen with or without progestin. Lifestyle      Do not use any products that have nicotine or tobacco, such as cigarettes, e-cigarettes, and chewing tobacco. If you need help quitting, ask your doctor.  Avoid secondhand smoke.  Exercise regularly. Ask your doctor about a cardiac rehab program.  Eat heart-healthy foods. Your doctor will tell you what foods to eat.  Stay at a healthy weight.  Lower your stress level.  Do not use illegal drugs. Alcohol use  Do not drink alcohol if: ? Your doctor tells you not to drink. ? You are pregnant, may be pregnant, or are planning to become pregnant.    If you drink alcohol: ? Limit how much you use to:  0-1 drink a day for women.  0-2 drinks a day for men. ? Know how much alcohol is in your drink. In the [place] one drink equals one 12 oz bottle of beer (355 mL), one 5 oz glass of wine (148 mL), or one 1 oz glass of hard liquor (44 mL). General instructions  Work with your doctor to treat other problems you may have, such as diabetes or high  blood pressure.  Get screened for depression. Get treatment if needed.  Keep your vaccines up to date. Get the flu shot (influenza vaccine) every year.  Keep all follow-up visits as told by your doctor. This is important. Contact a doctor if:  You feel very sad.  You have trouble doing your daily activities. Get help right away if:  You have sudden, unexplained discomfort in your chest, arms, back, neck, jaw, or upper body.  You have shortness of breath.  You have sudden sweating or clammy skin.  You feel like you may vomit.  You vomit.  You feel tired or weak.  You get light-headed or dizzy.  You feel your heart beating fast.  You feel your heart skipping beats.  You have blood pressure that is higher than 180/120. These symptoms may be an emergency. Do not wait to see if the symptoms will go away. Get medical help right away. Call your local emergency services (911 in the [place] Do not drive yourself to the hospital. Summary  A heart attack occurs when blood and oxygen supply to the heart is cut off.  Do not take NSAIDs unless your doctor says it is okay.  Do not smoke. Avoid secondhand smoke.  Exercise regularly. Ask your doctor about a cardiac rehab program. This information is not intended to replace advice given to you by your health care provider. Make sure you discuss any questions you have with your health care provider. Document Revised: 12/11/2018 Document Reviewed: 12/11/2018 Elsevier Patient Education  2020 Elsevier Inc.  

## 2020-04-29 NOTE — TOC Transition Note (Signed)
Transition of Care [facility]) - CM/SW Discharge Note   Patient Details  Name: [name] MRN: [id number] Date of Birth: Mar 13, 1941  [facility]) CM/SW Contact:  [name] Clam, RN Phone Number: 04/29/2020, 11:36 AM   Clinical Narrative:  Spoke to spouse about d/c plans-they already have otpt PT set up with [name]. Has rw, they asked about HH services-informed that if not medically necessary,& not ordered I am unable to provide-patient not recc for HHC-PT recc otpt PT, patient has support. Spouse voiced understanding. No further CM needs.      Final next level of care: OP Rehab Barriers to Discharge: No Barriers Identified   Patient Goals and CMS Choice Patient states their goals for this hospitalization and ongoing recovery are:: go home CMS Medicare.gov Compare Post Acute Care list provided to:: Patient Represenative (must comment) ([name] spouse) Choice offered to / list presented to : Spouse  Discharge Placement                       Discharge Plan and Services   Discharge Planning Services: CM Consult Post Acute Care Choice: NA                               Social Determinants of Health (SDOH) Interventions     Readmission Risk Interventions No flowsheet data found.

## 2020-04-29 NOTE — Care Management Important Message (Deleted)
Important Message  Patient Details IM Letter given to the Patient Name: [name] MRN: [id number] Date of Birth: Dec 26, 1940   Medicare Important Message Given:  Yes     [name] 04/29/2020, 10:19 AM

## 2020-04-30 DIAGNOSIS — R262 Difficulty in walking, not elsewhere classified: Secondary | ICD-10-CM | POA: Diagnosis not present

## 2020-04-30 DIAGNOSIS — Z96641 Presence of right artificial hip joint: Secondary | ICD-10-CM | POA: Diagnosis not present

## 2020-04-30 LAB — CULTURE, BLOOD (ROUTINE X 2)
Culture: NO GROWTH
Culture: NO GROWTH
Special Requests: ADEQUATE

## 2020-05-02 DIAGNOSIS — R262 Difficulty in walking, not elsewhere classified: Secondary | ICD-10-CM | POA: Diagnosis not present

## 2020-05-02 DIAGNOSIS — Z96641 Presence of right artificial hip joint: Secondary | ICD-10-CM | POA: Diagnosis not present

## 2020-05-02 DIAGNOSIS — I119 Hypertensive heart disease without heart failure: Secondary | ICD-10-CM | POA: Diagnosis not present

## 2020-05-02 DIAGNOSIS — Z6831 Body mass index (BMI) 31.0-31.9, adult: Secondary | ICD-10-CM | POA: Diagnosis not present

## 2020-05-02 DIAGNOSIS — E669 Obesity, unspecified: Secondary | ICD-10-CM | POA: Diagnosis not present

## 2020-05-02 DIAGNOSIS — I214 Non-ST elevation (NSTEMI) myocardial infarction: Secondary | ICD-10-CM | POA: Diagnosis not present

## 2020-05-02 DIAGNOSIS — I2510 Atherosclerotic heart disease of native coronary artery without angina pectoris: Secondary | ICD-10-CM | POA: Diagnosis not present

## 2020-05-02 DIAGNOSIS — K4090 Unilateral inguinal hernia, without obstruction or gangrene, not specified as recurrent: Secondary | ICD-10-CM | POA: Diagnosis not present

## 2020-05-02 DIAGNOSIS — E7800 Pure hypercholesterolemia, unspecified: Secondary | ICD-10-CM | POA: Diagnosis not present

## 2020-05-05 ENCOUNTER — Other Ambulatory Visit: Payer: Self-pay | Admitting: *Deleted

## 2020-05-05 DIAGNOSIS — M1611 Unilateral primary osteoarthritis, right hip: Secondary | ICD-10-CM | POA: Diagnosis not present

## 2020-05-05 NOTE — Patient Outreach (Signed)
[facility]) Care Management  05/05/2020  [name] May 21, 1941 [id number]   RED ON EMMI ALERT - General Discharge Day # 4 Date: 8/22 Red Alert Reason: Other questions or problems   Outreach attempt #1, unsuccessful, HIPAA compliant voice message left.   Plan: RN CM will send unsuccessful outreach letter and follow up within the next 3-4 business days.  Kemper Durie, [place], MSN [facility] Care Management  [facility] Manager [phone]

## 2020-05-07 DIAGNOSIS — R262 Difficulty in walking, not elsewhere classified: Secondary | ICD-10-CM | POA: Diagnosis not present

## 2020-05-07 DIAGNOSIS — Z96641 Presence of right artificial hip joint: Secondary | ICD-10-CM | POA: Diagnosis not present

## 2020-05-07 NOTE — Progress Notes (Signed)
Advanced Heart Failure Clinic Note   Date:  05/08/2020   ID:  Murel Shenberger, DOB [birth year], MRN [id number]  Location: Home  Provider location: [place] [facility] Type of Visit: Established patient  PCP:  Tisovec, Adelfa Koh, MD  Cardiologist:  No primary care provider on file. Primary HF: Bensimhon  Chief Complaint: Heart Failure follow-up   History of Present Illness: NASZIR COTT is a 79 y.o. male with history of CAD, multiple angioplasties to the RCA dating back to 1983 followed by CABG in 2012, HTN, and hyperlipidemia.  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  He was hospitalized in 11/11 because of recurrent chest pain. He underwent drug-eluting stent to the proximal RCA July 24, 2010. Ejection fraction was 65%. He also had 99% stenosis in the distal PDA with left to right collaterals and nonobstructive disease in the left coronary tree.  Admitted in October 2012 with [place]. Found to have progressive CAD. Underwent Coronary artery bypass grafting x4 (left internal mammary artery to LAD, saphenous vein graft to circumflex marginal, saphenous vein graft to posterior descending branch of the distal right coronary, saphenous vein graft to the posterolateral branch of the distal right coronary artery).   Had recurrent CP in 8/20 underwent cath which showed stable anatomy no targets for PCI  1. 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 LAD 100% 3. RCA 100% 4. LCX 50% in mid LCX 5. LIMA to LAD widely patent 6. SVG to R PDA widely patent 7. SVG to OM occluded ostially 8. SVG to PL occluded ostially Has moderate disease in anomalous LCX but not crittcal and anatomy precludes PCI. Chronic occlusion of SVG x 2    Cardiac cath 8/16 as below with no targets for revascularization and medical management. ECHO showed EF 40-45%.  S/P R hip replacement 04/22/20. He was discharged 04/23/20. On 04/25/20  he had AMS was sent to [facility] ED. HS Trop 111-->1421. It was unclear if this was demand ischemia. Had LHC on 8/16 with no targets for revascularization and medical management. ECHO showed EF 40-45%.  Today he returns for HF follow up.Overall feeling ok. Having some fatigue.  Feels like he is getter stronger with therapy.  Denies SOB/PND/Orthopnea. No chest pain. No bleeding issues. SBP at home 130-160/60s.  Appetite ok. No fever or chills. Taking all medications.   Carotid u/s 2/12: 40-59% bilat (stable) Cartoid u/s 3/12: LICA 60-79%, RICA 40-59%  Carotid u/s 9/13 40-59% bilaterally  Carotid u/s 1/16 40-59% bilaterally  Carotid u/s 3/17 RICA 60-79%, LICA 40-59% - referred to [facility] Carotid u/s with [facility] in 5/18: 5/18 RICA 40-59% and LICA 1-39%  11/14/12 ABI This led to a noninvasive lower extremity arterial study. This demonstrated noncompressible tibial arteries. His ABIs were 1.1 on the right and 1.0 on the left. His toe brachial indices were 0.77 on the right and 0.63 on the left. Waveforms were triphasic. Seen by Dr. Excell Seltzer and felt he was OK.   Past Medical History:  Diagnosis Date  . CAD (coronary artery disease)    s/p multiple PCIs to RCA dating back to 37. Last stent to proximal RCA in November of 2011. Also had 99% stenosis in the distal PDA with left to right collaterals and nonobstructive disease in the left coronary tree. ;  ISR of RCA 10/12 - CABG 10/9 with Dr. Zenaida Niece Trigt: L-LAD, S-OM, S-PDA  . Carotid bruit  carotid u/s 2/12: 40-59% bilaterally. (stable)  . HTN (hypertension)   . Hyperlipidemia    Past Surgical History:  Procedure Laterality Date  . CARDIAC CATHETERIZATION    . Coronary artery bypass grafting x4 (left internal mammary artery  06/23/2011  . CORONARY STENT PLACEMENT  Nov 2011   prox RCA. Has had prior multiple procedures.  Rosalie Doctor ANGIOGRAPHY N/A 05/04/2019   Procedure: CORONARY/GRAFT ANGIOGRAPHY;  Surgeon: Dolores Patty, MD;  Location: [facility] INVASIVE  CV LAB;  Service: Cardiovascular;  Laterality: N/A;  . JOINT REPLACEMENT    . LEFT HEART CATH AND CORS/GRAFTS ANGIOGRAPHY N/A 04/28/2020   Procedure: LEFT HEART CATH AND CORS/GRAFTS ANGIOGRAPHY;  Surgeon: Runell Gess, MD;  Location: [facility] INVASIVE CV LAB;  Service: Cardiovascular;  Laterality: N/A;  . left total hip replacement  2008  . TONSILLECTOMY    . TOOTH EXTRACTION  06/2013  . TOTAL HIP ARTHROPLASTY Right 04/22/2020   Procedure: TOTAL HIP ARTHROPLASTY;  Surgeon: Teryl Lucy, MD;  Location: [facility] ORS;  Service: Orthopedics;  Laterality: Right;     Current Outpatient Medications  Medication Sig Dispense Refill  . amLODipine (NORVASC) 10 MG tablet TAKE 1 TABLET ONCE DAILY. (Patient taking differently: Take 10 mg by mouth daily. ) 90 tablet 3  . aspirin EC 325 MG tablet Take 1 tablet (325 mg total) by mouth 2 (two) times daily. 60 tablet 0  . atorvastatin (LIPITOR) 80 MG tablet TAKE ONE TABLET BY MOUTH DAILY (Patient taking differently: Take 80 mg by mouth daily. ) 90 tablet 3  . ezetimibe (ZETIA) 10 MG tablet TAKE 1 TABLET BY MOUTH DAILY. (Patient taking differently: Take 10 mg by mouth daily. ) 90 tablet 3  . losartan (COZAAR) 100 MG tablet Take 1 tablet (100 mg total) by mouth daily. NEEDS APPT 90 tablet 0  . metoprolol tartrate (LOPRESSOR) 50 MG tablet Take 1 tablet (50 mg total) by mouth 2 (two) times daily. 60 tablet 0  . Omega-3 Fatty Acids (FISH OIL) 1000 MG CAPS Take 2,000 mg by mouth daily.    . pantoprazole (PROTONIX) 40 MG tablet TAKE 1 TABLET ONCE DAILY. (Patient taking differently: Take 40 mg by mouth daily. ) 90 tablet 3  . spironolactone (ALDACTONE) 25 MG tablet TAKE (1/2) TABLET DAILY. (Patient taking differently: Take 12.5 mg by mouth daily. ) 45 tablet 3  . Tamsulosin HCl (FLOMAX) 0.4 MG CAPS Take 0.8 mg by mouth at bedtime.     . nitroGLYCERIN (NITROSTAT) 0.4 MG SL tablet Place 0.4 mg under the tongue every 5 (five) minutes as needed for chest pain. (Patient not  taking: Reported on 05/08/2020)    . ondansetron (ZOFRAN) 4 MG tablet Take 1 tablet (4 mg total) by mouth every 8 (eight) hours as needed for nausea or vomiting. (Patient not taking: Reported on 05/08/2020) 10 tablet 0  . oxyCODONE (ROXICODONE) 5 MG immediate release tablet Take 1 tablet (5 mg total) by mouth every 4 (four) hours as needed for severe pain. (Patient not taking: Reported on 05/08/2020) 30 tablet 0  . sennosides-docusate sodium (SENOKOT-S) 8.6-50 MG tablet Take 2 tablets by mouth daily. (Patient not taking: Reported on 05/08/2020) 30 tablet 1   No current facility-administered medications for this encounter.    Allergies:   Morphine   Social History:  The patient  reports that he has never smoked. He has never used smokeless tobacco. He reports current alcohol use of about 6.0 standard drinks of alcohol per week. He reports that he does not  use drugs.   Family History:  The patient's family history includes Heart disease in his father.   ROS:  Please see the history of present illness.   All other systems are personally reviewed and negative.   Vitals:   05/08/20 1401  BP: (!) 182/84  Pulse: 63  SpO2: 96%    Exam:   General:  Well appearing. No resp difficulty HEENT: normal Neck: supple. no JVD. Carotids 2+ bilat; no bruits. No lymphadenopathy or thryomegaly appreciated. Cor: PMI nondisplaced. Regular rate & rhythm. No rubs, gallops or murmurs. Lungs: clear Abdomen: soft, nontender, nondistended. No hepatosplenomegaly. No bruits or masses. Good bowel sounds. Extremities: no cyanosis, clubbing, rash, R and LLE trace edema Neuro: alert & orientedx3, cranial nerves grossly intact. moves all 4 extremities w/o difficulty. Affect pleasant  EKG: Sinus Brady 55 bpm  Recent Labs: 04/25/2020: B Natriuretic Peptide 208.4 04/28/2020: ALT 50; BUN 16; Creatinine, Ser 0.82; Hemoglobin 9.1; Platelets 242; Potassium 4.3; Sodium 135  Personally reviewed   Wt Readings from Last 3  Encounters:  05/08/20 90.2 kg  04/28/20 88.6 kg  04/22/20 86.2 kg      ASSESSMENT AND PLAN:  1. CAD  S/p CABG 2012 -Cath 2021 with stable revascularization -- Continue statin, bb, asa - Consider SGT2i next visit.   2. HTN Elevated. Discussed increasing spironolactone to 25 mg daily however he would like to hold off. - Continue losartan 100 mg daily. -Continue  amlodipine to 10 mg daily.  - Continue current dose of metoprolol.  - Asked him to log BP.   3. Carotid stenosis - Stable 40-59% disease bilateral carotids. CCA with >50% hemodynamically significant plaque.  - Remains asymptomatic. Follows with Dr. Myra Gianotti  4. HL -Continue atorvastatin 80 mg daily   5. S/P THR 04/22/20   6. Anemia    Check BMET/CBC today.  Follow up with Dr Gala Romney in October 2021.   Waneta Martins, NP  05/08/2020 2:18 PM  Advanced Heart Failure Clinic [facility] [address] Heart and Vascular [place] [address] [phone] (office) [phone] (fax)

## 2020-05-08 ENCOUNTER — Other Ambulatory Visit: Payer: Self-pay | Admitting: *Deleted

## 2020-05-08 ENCOUNTER — Encounter (HOSPITAL_COMMUNITY): Payer: Self-pay

## 2020-05-08 ENCOUNTER — Ambulatory Visit (HOSPITAL_COMMUNITY)
Admission: RE | Admit: 2020-05-08 | Discharge: 2020-05-08 | Disposition: A | Discharge status: Home / Self Care | Payer: Medicare HMO | Source: Ambulatory Visit | Attending: Adult Health | Admitting: Adult Health

## 2020-05-08 ENCOUNTER — Other Ambulatory Visit: Payer: Self-pay

## 2020-05-08 VITALS — BP 182/84 | HR 63 | Wt 198.8 lb

## 2020-05-08 DIAGNOSIS — I2510 Atherosclerotic heart disease of native coronary artery without angina pectoris: Secondary | ICD-10-CM | POA: Insufficient documentation

## 2020-05-08 DIAGNOSIS — Z96641 Presence of right artificial hip joint: Secondary | ICD-10-CM | POA: Diagnosis not present

## 2020-05-08 DIAGNOSIS — Z8249 Family history of ischemic heart disease and other diseases of the circulatory system: Secondary | ICD-10-CM | POA: Insufficient documentation

## 2020-05-08 DIAGNOSIS — Z79899 Other long term (current) drug therapy: Secondary | ICD-10-CM | POA: Diagnosis not present

## 2020-05-08 DIAGNOSIS — Z951 Presence of aortocoronary bypass graft: Secondary | ICD-10-CM | POA: Diagnosis not present

## 2020-05-08 DIAGNOSIS — I10 Essential (primary) hypertension: Secondary | ICD-10-CM | POA: Diagnosis not present

## 2020-05-08 DIAGNOSIS — I6523 Occlusion and stenosis of bilateral carotid arteries: Secondary | ICD-10-CM | POA: Diagnosis not present

## 2020-05-08 DIAGNOSIS — Z885 Allergy status to narcotic agent status: Secondary | ICD-10-CM | POA: Diagnosis not present

## 2020-05-08 DIAGNOSIS — Z7982 Long term (current) use of aspirin: Secondary | ICD-10-CM | POA: Insufficient documentation

## 2020-05-08 DIAGNOSIS — E785 Hyperlipidemia, unspecified: Secondary | ICD-10-CM | POA: Insufficient documentation

## 2020-05-08 DIAGNOSIS — D649 Anemia, unspecified: Secondary | ICD-10-CM

## 2020-05-08 DIAGNOSIS — Z955 Presence of coronary angioplasty implant and graft: Secondary | ICD-10-CM | POA: Insufficient documentation

## 2020-05-08 LAB — BASIC METABOLIC PANEL
Anion gap: 9 (ref 5–15)
BUN: 12 mg/dL (ref 8–23)
CO2: 23 mmol/L (ref 22–32)
Calcium: 9.2 mg/dL (ref 8.9–10.3)
Chloride: 107 mmol/L (ref 98–111)
Creatinine, Ser: 0.99 mg/dL (ref 0.61–1.24)
GFR calc Af Amer: >60 mL/min (ref >60)
GFR calc non Af Amer: >60 mL/min (ref >60)
Glucose, Bld: 137 mg/dL — ABNORMAL HIGH (ref 70–99)
Potassium: 4.2 mmol/L (ref 3.5–5.1)
Sodium: 139 mmol/L (ref 135–145)

## 2020-05-08 LAB — CBC
HCT: 27.2 % — ABNORMAL LOW (ref 39.0–52.0)
Hemoglobin: 8.5 g/dL — ABNORMAL LOW (ref 13.0–17.0)
MCH: 31.3 pg (ref 26.0–34.0)
MCHC: 31.3 g/dL (ref 30.0–36.0)
MCV: 100 fL (ref 80.0–100.0)
Platelets: 352 10*3/uL (ref 150–400)
RBC: 2.72 MIL/uL — ABNORMAL LOW (ref 4.22–5.81)
RDW: 13.1 % (ref 11.5–15.5)
WBC: 6.3 10*3/uL (ref 4.0–10.5)
nRBC: 0 % (ref 0.0–0.2)

## 2020-05-08 NOTE — Patient Outreach (Signed)
[facility]) Care Management  05/08/2020  [name] 08-02-1941 [id number]    RED ON EMMI ALERT - General Discharge Day # 4 Date: 8/22 Red Alert Reason: Other questions or problems   Outreach attempt #2, unsuccessful, HIPAA compliant voice message left.   Plan: RN CM will follow up within the next 3-4 business days.  Kemper Durie, [place], MSN [facility] Care Management  [facility] Manager [phone]

## 2020-05-08 NOTE — Patient Instructions (Signed)
It was great to see you today! No medication changes are needed at this time.  Labs today We will only contact you if something comes back abnormal or we need to make some changes. Otherwise no news is good news!  Your physician has requested that you regularly monitor and record your blood pressure readings at home. Please use the same machine at the same time of day to check your readings and record them to bring to your follow-up visit.   Keep follow up as scheduled with Dr Gala Romney   If you have any questions or concerns before your next appointment please send [place] a message through [place] or call our office at [phone].    TO LEAVE A MESSAGE FOR THE NURSE SELECT OPTION 2, PLEASE LEAVE A MESSAGE INCLUDING: . YOUR NAME . DATE OF BIRTH . CALL BACK NUMBER . REASON FOR CALL**this is important as we prioritize the call backs  YOU WILL RECEIVE A CALL BACK THE SAME DAY AS LONG AS YOU CALL BEFORE 4:00 PM

## 2020-05-12 DIAGNOSIS — Z96641 Presence of right artificial hip joint: Secondary | ICD-10-CM | POA: Diagnosis not present

## 2020-05-12 DIAGNOSIS — R262 Difficulty in walking, not elsewhere classified: Secondary | ICD-10-CM | POA: Diagnosis not present

## 2020-05-14 ENCOUNTER — Other Ambulatory Visit: Payer: Self-pay | Admitting: *Deleted

## 2020-05-14 DIAGNOSIS — Z96641 Presence of right artificial hip joint: Secondary | ICD-10-CM | POA: Diagnosis not present

## 2020-05-14 DIAGNOSIS — R262 Difficulty in walking, not elsewhere classified: Secondary | ICD-10-CM | POA: Diagnosis not present

## 2020-05-14 NOTE — Patient Outreach (Signed)
[facility]) Care Management  05/14/2020  Granvel Proudfoot III October 10, 1940 [id number]   RED ON EMMI ALERT- General Discharge Day #4 Date:8/22 Red Alert Reason:Other questions or problems   Outreach attempt #3, unsuccessful, HIPAA compliant voice message left.  Will follow up with 4th attempt within the next 3 weeks, if remain unsuccessful, will close case due to inability to establish contact.  Kemper Durie, [place], MSN [facility] Care Management  [facility] Manager [phone]

## 2020-05-20 DIAGNOSIS — Z96641 Presence of right artificial hip joint: Secondary | ICD-10-CM | POA: Diagnosis not present

## 2020-05-20 DIAGNOSIS — R262 Difficulty in walking, not elsewhere classified: Secondary | ICD-10-CM | POA: Diagnosis not present

## 2020-05-23 DIAGNOSIS — Z96641 Presence of right artificial hip joint: Secondary | ICD-10-CM | POA: Diagnosis not present

## 2020-05-23 DIAGNOSIS — R262 Difficulty in walking, not elsewhere classified: Secondary | ICD-10-CM | POA: Diagnosis not present

## 2020-05-30 DIAGNOSIS — Z96641 Presence of right artificial hip joint: Secondary | ICD-10-CM | POA: Diagnosis not present

## 2020-05-30 DIAGNOSIS — R262 Difficulty in walking, not elsewhere classified: Secondary | ICD-10-CM | POA: Diagnosis not present

## 2020-06-02 DIAGNOSIS — M1611 Unilateral primary osteoarthritis, right hip: Secondary | ICD-10-CM | POA: Diagnosis not present

## 2020-06-03 DIAGNOSIS — Z96641 Presence of right artificial hip joint: Secondary | ICD-10-CM | POA: Diagnosis not present

## 2020-06-03 DIAGNOSIS — R262 Difficulty in walking, not elsewhere classified: Secondary | ICD-10-CM | POA: Diagnosis not present

## 2020-06-04 ENCOUNTER — Other Ambulatory Visit: Payer: Self-pay | Admitting: *Deleted

## 2020-06-04 NOTE — Patient Outreach (Signed)
[facility]) Care Management  06/04/2020  [name] 01-26-1941 [id number]   RED ON EMMI ALERT- General Discharge Day #4 Date:8/22 Red Alert Reason:Other questions or problems   Outreach attempt #4, unsuccessful, HIPAA compliant voice message left.  No response from member after multiple unsuccessful outreach attempts and letter sent.  Will close case at this time due to inability to maintain contact.  Will notify member and primary MD of case closure.  Kemper Durie, [place], MSN [facility] Care Management  [facility] Manager [phone]

## 2020-06-04 NOTE — Pre-Procedure Instructions (Signed)
Your procedure is scheduled on Tuesday, September, 28 from 07:30 AM- 09:00 AM.  Report to [facility] Main Entr[facility] "A" at 05:30 A.M., and check in at the Admitting office.  Call this number if you have problems the morning of surgery:  [phone]  Call [phone] if you have any questions prior to your surgery date Monday-Friday 8am-4pm.    Remember:  Do not eat after midnight the night before your surgery.  You may drink clear liquids until 04:30 AM the morning of your surgery.   Clear liquids allowed are: Water, Non-Citrus Juices (without pulp), Carbonated Beverages, Clear Tea, Black Coffee Only, and Gatorade.    Take these medicines the morning of surgery with A SIP OF WATER: amLODipine (NORVASC) atorvastatin (LIPITOR) ezetimibe (ZETIA)  metoprolol tartrate (LOPRESSOR) pantoprazole (PROTONIX)  IF NEEDED:  nitroGLYCERIN (NITROSTAT)  oxyCODONE (ROXICODONE)  Follow your surgeon's instructions on when to stop Aspirin.  If no instructions were given by your surgeon then you will need to call the office to get those instructions.     As of today, STOP taking any Aleve, Naproxen, Ibuprofen, Motrin, Advil, Goody's, BC's, all herbal medications, fish oil, and all vitamins.       The Morning of Surgery:               Do not wear jewelry.            Do not wear lotions, powders, colognes, or deodorant.            Men may shave face and neck.            Do not bring valuables to the hospital.            [facility] is not responsible for any belongings or valuables.  Do NOT Smoke (Tobacco/Vaping) or drink Alcohol 24 hours prior to your procedure.  If you use a CPAP at night, you may bring all equipment for your overnight stay.   Contacts, glasses, dentures or bridgework may not be worn into surgery.      For patients admitted to the hospital, discharge time will be determined by your treatment team.   Patients discharged the day of surgery will not be allowed to drive home,  and someone needs to stay with them for 24 hours.    Special instructions:   [place]- Preparing For Surgery  Before surgery, you can play an important role. Because skin is not sterile, your skin needs to be as free of germs as possible. You can reduce the number of germs on your skin by washing with CHG (chlorahexidine gluconate) Soap before surgery.  CHG is an antiseptic cleaner which kills germs and bonds with the skin to continue killing germs even after washing.    Oral Hygiene is also important to reduce your risk of infection.  Remember - BRUSH YOUR TEETH THE MORNING OF SURGERY WITH YOUR REGULAR TOOTHPASTE  Please do not use if you have an allergy to CHG or antibacterial soaps. If your skin becomes reddened/irritated stop using the CHG.  Do not shave (including legs and underarms) for at least 48 hours prior to first CHG shower. It is OK to shave your face.  Please follow these instructions carefully.   1. Shower the NIGHT BEFORE SURGERY and the MORNING OF SURGERY with CHG Soap.   2. If you chose to wash your hair, wash your hair first as usual with your normal shampoo.  3. After you shampoo, rinse your hair and  body thoroughly to remove the shampoo.  4. Use CHG as you would any other liquid soap. You can apply CHG directly to the skin and wash gently with a scrungie or a clean washcloth.   5. Apply the CHG Soap to your body ONLY FROM THE NECK DOWN.  Do not use on open wounds or open sores. Avoid contact with your eyes, ears, mouth and genitals (private parts). Wash Face and genitals (private parts)  with your normal soap.   6. Wash thoroughly, paying special attention to the area where your surgery will be performed.  7. Thoroughly rinse your body with warm water from the neck down.  8. DO NOT shower/wash with your normal soap after using and rinsing off the CHG Soap.  9. Pat yourself dry with a CLEAN TOWEL.  10. Wear CLEAN PAJAMAS to bed the night before  surgery  11. Place CLEAN SHEETS on your bed the night of your first shower and DO NOT SLEEP WITH PETS.   Day of Surgery: Shower Wear Clean/Comfortable clothing the morning of surgery. Do not apply any deodorants/lotions.   Remember to brush your teeth WITH YOUR REGULAR TOOTHPASTE.   Please read over the following fact sheets that you were given.

## 2020-06-05 ENCOUNTER — Other Ambulatory Visit: Payer: Self-pay

## 2020-06-05 ENCOUNTER — Encounter (HOSPITAL_COMMUNITY)
Admission: RE | Admit: 2020-06-05 | Discharge: 2020-06-05 | Disposition: A | Discharge status: Home / Self Care | Payer: Medicare HMO | Source: Ambulatory Visit | Attending: Surgery | Admitting: Surgery

## 2020-06-05 ENCOUNTER — Encounter (HOSPITAL_COMMUNITY): Payer: Self-pay

## 2020-06-05 DIAGNOSIS — Z96641 Presence of right artificial hip joint: Secondary | ICD-10-CM | POA: Diagnosis not present

## 2020-06-05 DIAGNOSIS — Z01812 Encounter for preprocedural laboratory examination: Secondary | ICD-10-CM | POA: Insufficient documentation

## 2020-06-05 DIAGNOSIS — R262 Difficulty in walking, not elsewhere classified: Secondary | ICD-10-CM | POA: Diagnosis not present

## 2020-06-05 HISTORY — DX: Unspecified osteoarthritis, unspecified site: M19.90

## 2020-06-05 HISTORY — DX: Gastro-esophageal reflux disease without esophagitis: K21.9

## 2020-06-05 LAB — CBC WITH DIFFERENTIAL/PLATELET
Abs Immature Granulocytes: 0.01 10*3/uL (ref 0.00–0.07)
Basophils Absolute: 0.1 10*3/uL (ref 0.0–0.1)
Basophils Relative: 1 %
Eosinophils Absolute: 0.3 10*3/uL (ref 0.0–0.5)
Eosinophils Relative: 4 %
HCT: 36.9 % — ABNORMAL LOW (ref 39.0–52.0)
Hemoglobin: 11.9 g/dL — ABNORMAL LOW (ref 13.0–17.0)
Immature Granulocytes: 0 %
Lymphocytes Relative: 28 %
Lymphs Abs: 1.7 10*3/uL (ref 0.7–4.0)
MCH: 32.6 pg (ref 26.0–34.0)
MCHC: 32.2 g/dL (ref 30.0–36.0)
MCV: 101.1 fL — ABNORMAL HIGH (ref 80.0–100.0)
Monocytes Absolute: 0.9 10*3/uL (ref 0.1–1.0)
Monocytes Relative: 14 %
Neutro Abs: 3.3 10*3/uL (ref 1.7–7.7)
Neutrophils Relative %: 53 %
Platelets: 276 10*3/uL (ref 150–400)
RBC: 3.65 MIL/uL — ABNORMAL LOW (ref 4.22–5.81)
RDW: 13.7 % (ref 11.5–15.5)
WBC: 6.2 10*3/uL (ref 4.0–10.5)
nRBC: 0 % (ref 0.0–0.2)

## 2020-06-05 LAB — COMPREHENSIVE METABOLIC PANEL
ALT: 17 U/L (ref 0–44)
AST: 22 U/L (ref 15–41)
Albumin: 4.1 g/dL (ref 3.5–5.0)
Alkaline Phosphatase: 47 U/L (ref 38–126)
Anion gap: 10 (ref 5–15)
BUN: 16 mg/dL (ref 8–23)
CO2: 23 mmol/L (ref 22–32)
Calcium: 9.3 mg/dL (ref 8.9–10.3)
Chloride: 105 mmol/L (ref 98–111)
Creatinine, Ser: 0.82 mg/dL (ref 0.61–1.24)
GFR calc Af Amer: >60 mL/min (ref >60)
GFR calc non Af Amer: >60 mL/min (ref >60)
Glucose, Bld: 102 mg/dL — ABNORMAL HIGH (ref 70–99)
Potassium: 4.1 mmol/L (ref 3.5–5.1)
Sodium: 138 mmol/L (ref 135–145)
Total Bilirubin: 1.1 mg/dL (ref 0.3–1.2)
Total Protein: 7.3 g/dL (ref 6.5–8.1)

## 2020-06-05 LAB — SURGICAL PCR SCREEN
MRSA, PCR: NEGATIVE
Staphylococcus aureus: NEGATIVE

## 2020-06-05 NOTE — Progress Notes (Signed)
PCP - Guerry Bruin, MD Cardiologist - Arvilla Meres, MD  PPM/ICD - Denies  Chest x-ray - 04/25/20 EKG - 05/08/20 Stress Test - 04/27/17 ECHO - 04/26/20 Cardiac Cath - 04/28/20  Sleep Study - Denies  Patient denies being diabetic.  Blood Thinner Instructions: N/A Aspirin Instructions: Patient to contact surgeon's office.  ERAS Protcol - Yes PRE-SURGERY Ensure or G2- Not ordered.  COVID TEST- 06/06/20   Anesthesia review: Yes, cardiac hx; needs cardiac clearance??  Patient denies shortness of breath, fever, cough and chest pain at PAT appointment   All instructions explained to the patient, with a verbal understanding of the material. Patient agrees to go over the instructions while at home for a better understanding. Patient also instructed to self quarantine after being tested for COVID-19. The opportunity to ask questions was provided.

## 2020-06-06 ENCOUNTER — Other Ambulatory Visit (HOSPITAL_COMMUNITY): Payer: Medicare HMO

## 2020-06-06 ENCOUNTER — Telehealth: Payer: Self-pay | Admitting: *Deleted

## 2020-06-06 ENCOUNTER — Other Ambulatory Visit (HOSPITAL_COMMUNITY)
Admission: RE | Admit: 2020-06-06 | Discharge: 2020-06-06 | Disposition: A | Discharge status: Home / Self Care | Payer: Medicare HMO | Source: Ambulatory Visit | Attending: Surgery | Admitting: Surgery

## 2020-06-06 DIAGNOSIS — Z8371 Family history of colonic polyps: Secondary | ICD-10-CM | POA: Diagnosis not present

## 2020-06-06 DIAGNOSIS — Z01812 Encounter for preprocedural laboratory examination: Secondary | ICD-10-CM | POA: Diagnosis not present

## 2020-06-06 DIAGNOSIS — Z20822 Contact with and (suspected) exposure to covid-19: Secondary | ICD-10-CM | POA: Insufficient documentation

## 2020-06-06 DIAGNOSIS — K4090 Unilateral inguinal hernia, without obstruction or gangrene, not specified as recurrent: Secondary | ICD-10-CM | POA: Diagnosis present

## 2020-06-06 DIAGNOSIS — N400 Enlarged prostate without lower urinary tract symptoms: Secondary | ICD-10-CM | POA: Diagnosis not present

## 2020-06-06 DIAGNOSIS — M549 Dorsalgia, unspecified: Secondary | ICD-10-CM | POA: Diagnosis not present

## 2020-06-06 DIAGNOSIS — Z8249 Family history of ischemic heart disease and other diseases of the circulatory system: Secondary | ICD-10-CM | POA: Diagnosis not present

## 2020-06-06 DIAGNOSIS — Z808 Family history of malignant neoplasm of other organs or systems: Secondary | ICD-10-CM | POA: Diagnosis not present

## 2020-06-06 DIAGNOSIS — K219 Gastro-esophageal reflux disease without esophagitis: Secondary | ICD-10-CM | POA: Diagnosis not present

## 2020-06-06 DIAGNOSIS — M1990 Unspecified osteoarthritis, unspecified site: Secondary | ICD-10-CM | POA: Diagnosis not present

## 2020-06-06 DIAGNOSIS — I2510 Atherosclerotic heart disease of native coronary artery without angina pectoris: Secondary | ICD-10-CM | POA: Diagnosis not present

## 2020-06-06 DIAGNOSIS — Z79899 Other long term (current) drug therapy: Secondary | ICD-10-CM | POA: Diagnosis not present

## 2020-06-06 DIAGNOSIS — Z882 Allergy status to sulfonamides status: Secondary | ICD-10-CM | POA: Diagnosis not present

## 2020-06-06 DIAGNOSIS — Z951 Presence of aortocoronary bypass graft: Secondary | ICD-10-CM | POA: Diagnosis not present

## 2020-06-06 DIAGNOSIS — I252 Old myocardial infarction: Secondary | ICD-10-CM | POA: Diagnosis not present

## 2020-06-06 DIAGNOSIS — I10 Essential (primary) hypertension: Secondary | ICD-10-CM | POA: Diagnosis not present

## 2020-06-06 DIAGNOSIS — E7800 Pure hypercholesterolemia, unspecified: Secondary | ICD-10-CM | POA: Diagnosis not present

## 2020-06-06 LAB — SARS CORONAVIRUS 2 (TAT 6-24 HRS): SARS Coronavirus 2: NEGATIVE

## 2020-06-06 NOTE — Telephone Encounter (Signed)
   [facility] Pre-operative Risk Assessment    HEARTCARE STAFF: - Please ensure there is not already an duplicate clearance open for this procedure. - Under Visit Info/Reason for Call, type in Other and utilize the format Clearance MM/DD/YY or Clearance TBD. Do not use dashes or single digits. - If request is for dental extraction, please clarify the # of teeth to be extracted.  Request for surgical clearance: PT IS FOLLOWED BY HEART FAILURE CLINIC  1. What type of surgery is being performed? RIGHT INGUINAL HERNIA REPAIR WITH MESH  2. When is this surgery scheduled? TBD  3. What type of clearance is required (medical clearance vs. Pharmacy clearance to hold med vs. Both)? MEDICAL  4. Are there any medications that need to be held prior to surgery and how long? ASA 325 MG BID PER CLEARANCE NOTE   5. Practice name and name of physician performing surgery? [facility]; DR. Marcello Moores CORNETT  6. What is the office phone number? [phone]   7.   What is the office fax number? [place]: [place], CMA  8.   Anesthesia type (None, local, MAC, general) ? GENERAL   Julaine Hua 06/06/2020, 3:04 PM  _________________________________________________________________   (provider comments below)

## 2020-06-09 NOTE — Telephone Encounter (Signed)
   Discussed and reviewed with Dr Gala Romney.   Recent LHC reviewed from August of this year.   Low-Mod risk for perioperative CV risk. Ok to proceed with surgery. No additional CV testing needed.   Yael Angerer NP-C  2:28 PM

## 2020-06-09 NOTE — Progress Notes (Signed)
Anesthesia Chart Review:  Follows with cardiology for history of CAD, multiple angioplasties to the RCA dating back to 1983 followed by CABG in 2012, HTN, and hyperlipidemia. 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 He was discharged 04/23/20. On 04/25/20 he had AMS was sent to [facility] ED. HS Trop 111-->1421. It was unclear if this was demand ischemia. Had LHC on 8/16 with no targets for revascularization and medical management. ECHO showed EF 40-45%. He had outpatient followed up with cardiology 05/08/20, stable at that time. Cardiac clearance per telephone encounter from Tonye Becket, NP 06/09/20 states, "Discussed and reviewed with Dr Gala Romney. Recent LHC reviewed from August of this year. Low-Mod risk for perioperative CV risk. Ok to proceed with surgery. 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  Last seen 04/08/2020 at that time he was cleared for hip replacement from vascular standpoint, no carotid intervention was recommended at that time.  Recommended to follow-up in 6 months for repeat studies.  Preop labs reviewed, mild anemia with hemoglobin 11.9, otherwise unremarkable.  EKG 05/08/2020: Sinus bradycardia. Rate 55.  PORTABLE CHEST 1 VIEW 04/25/2020: COMPARISON:  07/19/2011  FINDINGS: Post sternotomy changes. No focal opacity or pleural effusion. Aortic atherosclerosis. No pneumothorax.  IMPRESSION: No active disease.  LHC 04/28/2020: IMPRESSION: Mr. Kropp has unchanged anatomy from the diagnostic cath performed by Dr. Gala Romney 1 year ago.  The vein to his PDA and LIMA to his LAD were patent.  His native anatomy otherwise is unchanged with an occluded dominant RCA.  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  The circumflex was patent as was the LAD.  There is no culprit lesion identified.  A left  femoral angiogram was performed and a Mynx closure device was successfully deployed achieving hemostasis.  The patient left lab in stable condition.  TTE 04/26/2020: 1. Left ventricular ejection fraction, by estimation, is 40 to 45%. The  left ventricle has mildly decreased function. The left ventricle  demonstrates regional wall motion abnormalities (see scoring  diagram/findings for description). There is mild  concentric left ventricular hypertrophy. Left ventricular diastolic  parameters were normal.  2. Right ventricular systolic function is normal. The right ventricular  size is normal. There is normal pulmonary artery systolic pressure. The  estimated right ventricular systolic pressure is 32.8 mmHg.  3. The mitral valve is grossly normal. No evidence of mitral valve  regurgitation. No evidence of mitral stenosis.  4. The aortic valve is tricuspid. Aortic valve regurgitation is not  visualized. Mild to moderate aortic valve sclerosis/calcification is  present, without any evidence of aortic stenosis.  5. The inferior vena cava is normal in size with greater than 50%  respiratory variability, suggesting right atrial pressure of 3 mmHg.   Carotid ultrasound 04/08/2020: Summary:  Right Carotid: Velocities in the right ICA are consistent with a 60-79% stenosis.   Left Carotid: Velocities in the left ICA are consistent with a 60-79% stenosis.   Vertebrals: Bilateral vertebral arteries demonstrate antegrade flow.    NOTE: Calcific plaque may obscure higher velocity.     Zannie Cove [facility] Short Stay Center/Anesthesiology Phone [phone] 06/09/2020 3:45 PM'

## 2020-06-09 NOTE — Anesthesia Preprocedure Evaluation (Addendum)
Anesthesia Evaluation  Patient identified by MRN, date of birth, ID band Patient awake    Reviewed: Allergy & Precautions, NPO status   Airway Mallampati: I  TM Distance: >3 FB Neck ROM: Full    Dental   Pulmonary    Pulmonary exam normal        Cardiovascular hypertension, + CAD, + Past MI and + CABG  Normal cardiovascular exam     Neuro/Psych    GI/Hepatic GERD  Medicated and Controlled,  Endo/Other    Renal/GU      Musculoskeletal   Abdominal   Peds  Hematology   Anesthesia Other Findings   Reproductive/Obstetrics                            Anesthesia Physical Anesthesia Plan  ASA: III  Anesthesia Plan: General   Post-op Pain Management:  Regional for Post-op pain   Induction:   PONV Risk Score and Plan: 2 and Ondansetron and Treatment may vary due to age or medical condition  Airway Management Planned: LMA  Additional Equipment:   Intra-op Plan:   Post-operative Plan: Extubation in OR  Informed Consent: I have reviewed the patients History and Physical, chart, labs and discussed the procedure including the risks, benefits and alternatives for the proposed anesthesia with the patient or authorized representative who has indicated his/her understanding and acceptance.       Plan Discussed with: CRNA and Surgeon  Anesthesia Plan Comments: (PAT note by Antionette Poles, PA-C: Follows with cardiology for history of CAD, multiple angioplasties to the RCA dating back to 1983 followed by CABG in 2012, HTN, and hyperlipidemia. 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 He was discharged 04/23/20. On 04/25/20 he had AMS was sent to [facility] ED. HS Trop 111-->1421. It was unclear if this was demand ischemia. Had LHC on 8/16 with no targets for revascularization and medical management. ECHO showed EF 40-45%. He had  outpatient followed up with cardiology 05/08/20, stable at that time. Cardiac clearance per telephone encounter from Tonye Becket, NP 06/09/20 states, "Discussed and reviewed with Dr Gala Romney. Recent LHC reviewed from August of this year. Low-Mod risk for perioperative CV risk. Ok to proceed with surgery. 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  Last seen 04/08/2020 at that time he was cleared for hip replacement from vascular standpoint, no carotid intervention was recommended at that time.  Recommended to follow-up in 6 months for repeat studies.  Preop labs reviewed, mild anemia with hemoglobin 11.9, otherwise unremarkable.  EKG 05/08/2020: Sinus bradycardia. Rate 55.  PORTABLE CHEST 1 VIEW 04/25/2020: COMPARISON:  07/19/2011  FINDINGS: Post sternotomy changes. No focal opacity or pleural effusion. Aortic atherosclerosis. No pneumothorax.  IMPRESSION: No active disease.  LHC 04/28/2020: IMPRESSION: Mr. Hollenbeck has unchanged anatomy from the diagnostic cath performed by Dr. Gala Romney 1 year ago.  The vein to his PDA and LIMA to his LAD were patent.  His native anatomy otherwise is unchanged with an occluded dominant RCA.  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  The circumflex was patent as was the LAD.  There is no culprit lesion identified.  A left femoral angiogram was performed and a Mynx closure device was successfully deployed achieving hemostasis.  The patient left lab in stable condition.  TTE  04/26/2020: 1. Left ventricular ejection fraction, by estimation, is 40 to 45%. The  left ventricle has mildly decreased function. The left ventricle  demonstrates regional wall motion abnormalities (see scoring  diagram/findings for description). There is mild  concentric left ventricular hypertrophy. Left ventricular diastolic  parameters were normal.  2. Right ventricular  systolic function is normal. The right ventricular  size is normal. There is normal pulmonary artery systolic pressure. The  estimated right ventricular systolic pressure is 32.8 mmHg.  3. The mitral valve is grossly normal. No evidence of mitral valve  regurgitation. No evidence of mitral stenosis.  4. The aortic valve is tricuspid. Aortic valve regurgitation is not  visualized. Mild to moderate aortic valve sclerosis/calcification is  present, without any evidence of aortic stenosis.  5. The inferior vena cava is normal in size with greater than 50%  respiratory variability, suggesting right atrial pressure of 3 mmHg.   Carotid ultrasound 04/08/2020: Summary:  Right Carotid: Velocities in the right ICA are consistent with a 60-79% stenosis.   Left Carotid: Velocities in the left ICA are consistent with a 60-79% stenosis.   Vertebrals: Bilateral vertebral arteries demonstrate antegrade flow.    NOTE: Calcific plaque may obscure higher velocity.  )       Anesthesia Quick Evaluation

## 2020-06-09 NOTE — Telephone Encounter (Signed)
Routed to Amy Clegg,NP for clearance

## 2020-06-09 NOTE — Telephone Encounter (Signed)
[name] from [facility] states the patient had gotten clearance back in July, but he had a heart attack and they need a new clearance due to his stent. She states he is scheduled for his surgery tomorrow.

## 2020-06-10 ENCOUNTER — Encounter (HOSPITAL_COMMUNITY): Payer: Self-pay | Admitting: Surgery

## 2020-06-10 ENCOUNTER — Encounter (HOSPITAL_COMMUNITY)
Admission: RE | Disposition: A | Discharge status: Home / Self Care | Payer: Self-pay | Source: Home / Self Care | Attending: Surgery

## 2020-06-10 ENCOUNTER — Ambulatory Visit (HOSPITAL_COMMUNITY)
Admission: RE | Admit: 2020-06-10 | Discharge: 2020-06-10 | Disposition: A | Discharge status: Home / Self Care | Payer: Medicare HMO | Attending: Surgery | Admitting: Surgery

## 2020-06-10 ENCOUNTER — Other Ambulatory Visit: Payer: Self-pay

## 2020-06-10 ENCOUNTER — Ambulatory Visit (HOSPITAL_COMMUNITY): Payer: Medicare HMO | Admitting: Anesthesiology

## 2020-06-10 ENCOUNTER — Ambulatory Visit (HOSPITAL_COMMUNITY): Payer: Medicare HMO | Admitting: Physician Assistant

## 2020-06-10 DIAGNOSIS — Z8249 Family history of ischemic heart disease and other diseases of the circulatory system: Secondary | ICD-10-CM | POA: Insufficient documentation

## 2020-06-10 DIAGNOSIS — Z951 Presence of aortocoronary bypass graft: Secondary | ICD-10-CM | POA: Diagnosis not present

## 2020-06-10 DIAGNOSIS — K219 Gastro-esophageal reflux disease without esophagitis: Secondary | ICD-10-CM | POA: Diagnosis not present

## 2020-06-10 DIAGNOSIS — M1990 Unspecified osteoarthritis, unspecified site: Secondary | ICD-10-CM | POA: Insufficient documentation

## 2020-06-10 DIAGNOSIS — K4090 Unilateral inguinal hernia, without obstruction or gangrene, not specified as recurrent: Secondary | ICD-10-CM | POA: Insufficient documentation

## 2020-06-10 DIAGNOSIS — N400 Enlarged prostate without lower urinary tract symptoms: Secondary | ICD-10-CM | POA: Insufficient documentation

## 2020-06-10 DIAGNOSIS — Z808 Family history of malignant neoplasm of other organs or systems: Secondary | ICD-10-CM | POA: Insufficient documentation

## 2020-06-10 DIAGNOSIS — I214 Non-ST elevation (NSTEMI) myocardial infarction: Secondary | ICD-10-CM | POA: Diagnosis not present

## 2020-06-10 DIAGNOSIS — M549 Dorsalgia, unspecified: Secondary | ICD-10-CM | POA: Insufficient documentation

## 2020-06-10 DIAGNOSIS — E7800 Pure hypercholesterolemia, unspecified: Secondary | ICD-10-CM | POA: Insufficient documentation

## 2020-06-10 DIAGNOSIS — I10 Essential (primary) hypertension: Secondary | ICD-10-CM | POA: Insufficient documentation

## 2020-06-10 DIAGNOSIS — Z79899 Other long term (current) drug therapy: Secondary | ICD-10-CM | POA: Diagnosis not present

## 2020-06-10 DIAGNOSIS — G8918 Other acute postprocedural pain: Secondary | ICD-10-CM | POA: Diagnosis not present

## 2020-06-10 DIAGNOSIS — Z882 Allergy status to sulfonamides status: Secondary | ICD-10-CM | POA: Diagnosis not present

## 2020-06-10 DIAGNOSIS — I2510 Atherosclerotic heart disease of native coronary artery without angina pectoris: Secondary | ICD-10-CM | POA: Insufficient documentation

## 2020-06-10 DIAGNOSIS — I252 Old myocardial infarction: Secondary | ICD-10-CM | POA: Insufficient documentation

## 2020-06-10 DIAGNOSIS — Z8371 Family history of colonic polyps: Secondary | ICD-10-CM | POA: Diagnosis not present

## 2020-06-10 HISTORY — PX: INSERTION OF MESH: SHX5868

## 2020-06-10 HISTORY — PX: INGUINAL HERNIA REPAIR: SHX194

## 2020-06-10 SURGERY — REPAIR, HERNIA, INGUINAL, ADULT
Anesthesia: General | Site: Groin | Laterality: Right

## 2020-06-10 MED ORDER — CHLORHEXIDINE GLUCONATE CLOTH 2 % EX PADS: 6.0000 | MEDICATED_PAD | Freq: Once | CUTANEOUS | Status: DC

## 2020-06-10 MED ORDER — ONDANSETRON HCL 4 MG/2ML IJ SOLN
INTRAMUSCULAR | Status: DC | PRN
  Administered 2020-06-10: 4 mg via INTRAVENOUS

## 2020-06-10 MED ORDER — ACETAMINOPHEN 500 MG PO TABS
ORAL_TABLET | ORAL | Status: AC
  Filled 2020-06-10: qty 2

## 2020-06-10 MED ORDER — CEFAZOLIN SODIUM-DEXTROSE 2-4 GM/100ML-% IV SOLN
2.0000 g | INTRAVENOUS | Status: AC
  Administered 2020-06-10: 2 g via INTRAVENOUS

## 2020-06-10 MED ORDER — EPHEDRINE 5 MG/ML INJ
INTRAVENOUS | Status: AC
  Filled 2020-06-10: qty 10

## 2020-06-10 MED ORDER — MIDAZOLAM HCL 2 MG/2ML IJ SOLN
INTRAMUSCULAR | Status: AC
  Filled 2020-06-10: qty 2

## 2020-06-10 MED ORDER — ONDANSETRON HCL 4 MG/2ML IJ SOLN
INTRAMUSCULAR | Status: AC
  Filled 2020-06-10: qty 2

## 2020-06-10 MED ORDER — EPHEDRINE SULFATE 50 MG/ML IJ SOLN
INTRAMUSCULAR | Status: DC | PRN
  Administered 2020-06-10 (×2): 10 mg via INTRAVENOUS

## 2020-06-10 MED ORDER — LIDOCAINE 2% (20 MG/ML) 5 ML SYRINGE
INTRAMUSCULAR | Status: AC
  Filled 2020-06-10: qty 5

## 2020-06-10 MED ORDER — OXYCODONE HCL 5 MG PO TABS
ORAL_TABLET | ORAL | Status: AC
  Filled 2020-06-10: qty 1

## 2020-06-10 MED ORDER — BUPIVACAINE HCL (PF) 0.25 % IJ SOLN
INTRAMUSCULAR | Status: AC
  Filled 2020-06-10: qty 30

## 2020-06-10 MED ORDER — 0.9 % SODIUM CHLORIDE (POUR BTL) OPTIME
TOPICAL | Status: DC | PRN
  Administered 2020-06-10: 1000 mL

## 2020-06-10 MED ORDER — ORAL CARE MOUTH RINSE: 15.0000 mL | Freq: Once | OROMUCOSAL | Status: AC

## 2020-06-10 MED ORDER — CHLORHEXIDINE GLUCONATE 0.12 % MT SOLN
OROMUCOSAL | Status: AC
  Administered 2020-06-10: 15 mL via OROMUCOSAL
  Filled 2020-06-10: qty 15

## 2020-06-10 MED ORDER — FENTANYL CITRATE (PF) 100 MCG/2ML IJ SOLN
INTRAMUSCULAR | Status: DC | PRN
Start: 2020-06-10 — End: 2020-06-10
  Administered 2020-06-10 (×4): 50 ug via INTRAVENOUS

## 2020-06-10 MED ORDER — DEXAMETHASONE SODIUM PHOSPHATE 10 MG/ML IJ SOLN
INTRAMUSCULAR | Status: AC
  Filled 2020-06-10: qty 1

## 2020-06-10 MED ORDER — BUPIVACAINE-EPINEPHRINE 0.25% -1:200000 IJ SOLN
INTRAMUSCULAR | Status: DC | PRN
  Administered 2020-06-10: 22 mL

## 2020-06-10 MED ORDER — FENTANYL CITRATE (PF) 250 MCG/5ML IJ SOLN
INTRAMUSCULAR | Status: AC
  Filled 2020-06-10: qty 5

## 2020-06-10 MED ORDER — LACTATED RINGERS IV SOLN: INTRAVENOUS | Status: DC

## 2020-06-10 MED ORDER — CEFAZOLIN SODIUM-DEXTROSE 2-4 GM/100ML-% IV SOLN
INTRAVENOUS | Status: AC
  Filled 2020-06-10: qty 100

## 2020-06-10 MED ORDER — LIDOCAINE 2% (20 MG/ML) 5 ML SYRINGE
INTRAMUSCULAR | Status: DC | PRN
  Administered 2020-06-10: 100 mg via INTRAVENOUS

## 2020-06-10 MED ORDER — OXYCODONE HCL 5 MG PO TABS: 5.0000 mg | ORAL_TABLET | Freq: Four times a day (QID) | ORAL | 0 refills | Status: DC | PRN

## 2020-06-10 MED ORDER — PROPOFOL 10 MG/ML IV BOLUS
INTRAVENOUS | Status: AC
  Filled 2020-06-10: qty 20

## 2020-06-10 MED ORDER — PROPOFOL 10 MG/ML IV BOLUS
INTRAVENOUS | Status: DC | PRN
  Administered 2020-06-10: 150 mg via INTRAVENOUS

## 2020-06-10 MED ORDER — CHLORHEXIDINE GLUCONATE 0.12 % MT SOLN: 15.0000 mL | Freq: Once | OROMUCOSAL | Status: AC

## 2020-06-10 MED ORDER — OXYCODONE HCL 5 MG PO TABS
5.0000 mg | ORAL_TABLET | Freq: Once | ORAL | Status: AC
  Administered 2020-06-10: 5 mg via ORAL

## 2020-06-10 MED ORDER — DEXAMETHASONE SODIUM PHOSPHATE 10 MG/ML IJ SOLN
INTRAMUSCULAR | Status: DC | PRN
  Administered 2020-06-10: 5 mg via INTRAVENOUS

## 2020-06-10 MED ORDER — PHENYLEPHRINE HCL-NACL 10-0.9 MG/250ML-% IV SOLN
INTRAVENOUS | Status: DC | PRN
  Administered 2020-06-10: 30 ug/min via INTRAVENOUS

## 2020-06-10 MED ORDER — ACETAMINOPHEN 500 MG PO TABS: 1000.0000 mg | ORAL_TABLET | ORAL | Status: DC

## 2020-06-10 MED ORDER — BUPIVACAINE-EPINEPHRINE (PF) 0.5% -1:200000 IJ SOLN
INTRAMUSCULAR | Status: DC | PRN
  Administered 2020-06-10: 30 mL

## 2020-06-10 SURGICAL SUPPLY — 43 items
ADH SKN CLS APL DERMABOND .7 (GAUZE/BANDAGES/DRESSINGS) ×1
APL PRP STRL LF DISP 70% ISPRP (MISCELLANEOUS) ×1
BLADE CLIPPER SURG (BLADE) ×1 IMPLANT
BLADE SURG 15 STRL LF DISP TIS (BLADE) IMPLANT
BLADE SURG 15 STRL SS (BLADE) ×2
CANISTER SUCT 3000ML PPV (MISCELLANEOUS) ×1 IMPLANT
CHLORAPREP W/TINT 26 (MISCELLANEOUS) ×2 IMPLANT
COVER SURGICAL LIGHT HANDLE (MISCELLANEOUS) ×2 IMPLANT
COVER WAND RF STERILE (DRAPES) ×2 IMPLANT
DERMABOND ADVANCED (GAUZE/BANDAGES/DRESSINGS) ×1
DERMABOND ADVANCED .7 DNX12 (GAUZE/BANDAGES/DRESSINGS) ×1 IMPLANT
DRAIN PENROSE 1/2X12 LTX STRL (WOUND CARE) ×1 IMPLANT
DRAPE LAPAROTOMY TRNSV 102X78 (DRAPES) ×2 IMPLANT
ELECT REM PT RETURN 9FT ADLT (ELECTROSURGICAL) ×2
ELECTRODE REM PT RTRN 9FT ADLT (ELECTROSURGICAL) ×1 IMPLANT
GLOVE BIO SURGEON STRL SZ8 (GLOVE) ×2 IMPLANT
GLOVE BIOGEL PI IND STRL 8 (GLOVE) ×1 IMPLANT
GLOVE BIOGEL PI INDICATOR 8 (GLOVE) ×1
GOWN STRL REUS W/ TWL LRG LVL3 (GOWN DISPOSABLE) ×1 IMPLANT
GOWN STRL REUS W/ TWL XL LVL3 (GOWN DISPOSABLE) ×1 IMPLANT
GOWN STRL REUS W/TWL LRG LVL3 (GOWN DISPOSABLE) ×2
GOWN STRL REUS W/TWL XL LVL3 (GOWN DISPOSABLE) ×2
KIT BASIN OR (CUSTOM PROCEDURE TRAY) ×2 IMPLANT
KIT TURNOVER KIT B (KITS) ×2 IMPLANT
MESH HERNIA SYS ULTRAPRO LRG (Mesh General) ×1 IMPLANT
NDL HYPO 25GX1X1/2 BEV (NEEDLE) ×1 IMPLANT
NEEDLE HYPO 25GX1X1/2 BEV (NEEDLE) ×2 IMPLANT
NS IRRIG 1000ML POUR BTL (IV SOLUTION) ×2 IMPLANT
PACK GENERAL/GYN (CUSTOM PROCEDURE TRAY) ×2 IMPLANT
PAD ARMBOARD 7.5X6 YLW CONV (MISCELLANEOUS) ×2 IMPLANT
PENCIL SMOKE EVACUATOR (MISCELLANEOUS) ×2 IMPLANT
SUT MNCRL AB 4-0 PS2 18 (SUTURE) ×2 IMPLANT
SUT NOVA NAB DX-16 0-1 5-0 T12 (SUTURE) ×3 IMPLANT
SUT SILK 2 0 SH (SUTURE) ×1 IMPLANT
SUT VIC AB 0 CT1 27 (SUTURE)
SUT VIC AB 0 CT1 27XBRD ANBCTR (SUTURE) IMPLANT
SUT VIC AB 2-0 SH 27 (SUTURE) ×2
SUT VIC AB 2-0 SH 27X BRD (SUTURE) ×1 IMPLANT
SUT VIC AB 3-0 SH 18 (SUTURE) ×2 IMPLANT
SUT VICRYL AB 3 0 TIES (SUTURE) ×2 IMPLANT
SYR CONTROL 10ML LL (SYRINGE) ×2 IMPLANT
TOWEL GREEN STERILE (TOWEL DISPOSABLE) ×2 IMPLANT
TOWEL GREEN STERILE FF (TOWEL DISPOSABLE) ×2 IMPLANT

## 2020-06-10 NOTE — H&P (Signed)
[name] Location: [facility] Patient #: [id number] DOB: 1941-08-17 Married / Language: English / Race: White Male  History of Present Illness (ThoAM) Patient words: Patient presents for evaluation of right groin pain. He's had it for a number of months. Location is right groin and activity makes this worse especially bending over, lifting, pushing or pulling. The pain is episodic and sharp in nature. He does feel a bulge in his right groin is well. Denies any left groin pain.  The patient is a 79 year old male.   Past Surgical HistoryCoronary Artery Bypass Graft Hip Surgery Left.  Allergies Morphine Sulfate (Concentrate) *ANALGESICS - OPIOID*  Medication History  amLODIPine Besylate (10MG  Tablet, Oral) Active. Atorvastatin Calcium (80MG  Tablet, Oral) Active. Pantoprazole Sodium (40MG  Tablet DR, Oral) Active. Spironolactone (25MG  Tablet, Oral) Active. Tamsulosin HCl (0.4MG  Capsule, Oral) Active. Losartan Potassium (100MG  Tablet, Oral) Active. Ezetimibe (10MG  Tablet, Oral) Active. Nitroglycerin (0.4MG  Tab Sublingual, Sublingual) Active. Omega-3 EPA Fish Oil (1205MG  Capsule, Oral) Active.  Social History Alcohol use Occasional alcohol use. Caffeine use Carbonated beverages, Coffee. No drug use Tobacco use Never smoker.  Family History Colon Polyps Father. Heart Disease Father. Hypertension Father. Melanoma Father.  Other Problems Arthritis Back Pain Chest pain Enlarged Prostate High blood pressure Hypercholesterolemia     Review of Systems) General Not Present- Appetite Loss, Chills, Fatigue, Fever, Night Sweats, Weight Gain and Weight Loss. HEENT Present- Wears glasses/contact lenses. Not Present- Earache, Hearing Loss, Hoarseness, Nose Bleed, Oral Ulcers, Ringing in the Ears, Seasonal Allergies, Sinus Pain, Sore Throat, Visual Disturbances and Yellow Eyes. Gastrointestinal Not Present- Abdominal Pain,  Bloating, Bloody Stool, Change in Bowel Habits, Chronic diarrhea, Constipation, Difficulty Swallowing, Excessive gas, Gets full quickly at meals, Hemorrhoids, Indigestion, Nausea, Rectal Pain and Vomiting. Male Genitourinary Present- Frequency. Not Present- Blood in Urine, Change in Urinary Stream, Impotence, Nocturia, Painful Urination, Urgency and Urine Leakage. Musculoskeletal Present- Back Pain. Not Present- Joint Pain, Joint Stiffness, Muscle Pain, Muscle Weakness and Swelling of Extremities. Psychiatric Not Present- Anxiety, Bipolar, Change in Sleep Pattern, Depression, Fearful and Frequent crying. Endocrine Not Present- Cold Intolerance, Excessive Hunger, Hair Changes, Heat Intolerance, Hot flashes and New Diabetes. Hematology Not Present- Blood Thinners, Easy Bruising, Excessive bleeding, Gland problems, HIV and Persistent Infections. All other systems negative  Vitals [place] RMA; 30 AM) 03/07/2020 10:29 AM Weight: 197.13 lb Height: 67in Body Surface Area: 2.01 m Body Mass Index: 30.87 kg/m  Temp.: 98.61F  Pulse: 87 (Regular)  BP: 160/74(Sitting, Left Arm, Standard)        Physical Exam (Landry Lookingbill A. Tatia Petrucci MD;)  General Mental Status-Alert. General Appearance-Consistent with stated age. Hydration-Well hydrated. Voice-Normal.  Head and Neck Head-normocephalic, atraumatic with no lesions or palpable masses. Trachea-midline. Thyroid Gland Characteristics - normal size and consistency.  Eye Eyeball - Bilateral-Extraocular movements intact. Sclera/Conjunctiva - Bilateral-No scleral icterus.  Chest and Lung Exam Chest and lung exam reveals -quiet, even and easy respiratory effort with no use of accessory muscles and on auscultation, normal breath sounds, no adventitious sounds and normal vocal resonance. Inspection Chest Wall - Normal. Back - normal.  Breast Breast - Left-Symmetric, Non Tender, No Biopsy scars, no  Dimpling - Left, No Inflammation, No Lumpectomy scars, No Mastectomy scars, No Peau d' Orange. Breast - Right-Symmetric, Non Tender, No Biopsy scars, no Dimpling - Right, No Inflammation, No Lumpectomy scars, No Mastectomy scars, No Peau d' Orange. Breast Lump-No Palpable Breast Mass.  Cardiovascular Cardiovascular examination reveals -normal heart sounds, regular rate and rhythm with no murmurs and  normal pedal pulses bilaterally.  Abdomen Inspection Skin - Scar - no surgical scars. Palpation/Percussion Palpation and Percussion of the abdomen reveal - Soft, Non Tender, No Rebound tenderness, No Rigidity (guarding) and No hepatosplenomegaly. Auscultation Auscultation of the abdomen reveals - Bowel sounds normal. Note: Small reducible right inguinal hernia. No evidence of left inguinal hernia.  Neurologic Neurologic evaluation reveals -alert and oriented x 3 with no impairment of recent or remote memory. Mental Status-Normal.  Musculoskeletal Normal Exam - Left-Upper Extremity Strength Normal and Lower Extremity Strength Normal. Normal Exam - Right-Upper Extremity Strength Normal and Lower Extremity Strength Normal.  Lymphatic Head & Neck  General Head & Neck Lymphatics: Bilateral - Description - Normal. Axillary  General Axillary Region: Bilateral - Description - Normal. Tenderness - Non Tender. Femoral & Inguinal  Generalized Femoral & Inguinal Lymphatics: Bilateral - Description - Normal. Tenderness - Non Tender.    Assessment & Plan (B[place]ley [place] A. Sabree Nuon MD;  RIGHT INGUINAL HERNIA (K40.90) Impression: Reducible symptomatic right inguinal hernia. Patient desires repair. The risk of hernia repair include bleeding, infection, organ injury, bowel injury, bladder injury, nerve injury recurrent hernia, blood clots, worsening of underlying condition, chronic pain, mesh use, open surgery, death, and the need for other operations. Pt agrees to proceed Total  time 45 minutes discussing surgery, complications, Dr. dictation, examination, reviewing the chart.  Current Plans You are being scheduled for surgery- Our schedulers will call you.  You should hear from our office's scheduling department within 5 working days about the location, date, and time of surgery. We try to make accommodations for patient's preferences in scheduling surgery, but sometimes the OR schedule or the surgeon's schedule prevents [place] from making those accommodations.  If you have not heard from our office [phone]) in 5 working days, call the office and ask for your surgeon's nurse.  If you have other questions about your diagnosis, plan, or surgery, call the office and ask for your surgeon's nurse.  Pt Education - Pamphlet Given - Hernia Surgery: discussed with patient and provided information. Pt Education - CCS Mesh education: discussed with patient and provided information. Pt Education - Consent for inguinal hernia - [place]: discussed with patient and provided information. The anatomy & physiology of the abdominal wall and pelvic floor was discussed. The pathophysiology of hernias in the inguinal and pelvic region was discussed. Natural history risks such as progressive enlargement, pain, incarceration, and strangulation was discussed. Contributors to complications such as smoking, obesity, diabetes, prior surgery, etc were discussed.  I feel the risks of no intervention will lead to serious problems that outweigh the operative risks; therefore, I recommended surgery to reduce and repair the hernia. I explained r an open approach. I noted usual use of mesh to patch and/or buttress hernia repair  Risks such as bleeding, infection, abscess, need for further treatment, heart attack, death, and other risks were discussed. I noted a good likelihood this will help address the problem. Goals of post-operative recovery were discussed as well. Possibility  that this will not correct all symptoms was explained. I stressed the importance of low-impact activity, aggressive pain control, avoiding constipation, & not pushing through pain to minimize risk of post-operative chronic pain or injury. Possibility of reherniation was discussed. We will work to minimize complications.  An educational handout further explaining the pathology & treatment options was given as well. Questions were answered. The patient expresses understanding & wishes to proceed with surgery.

## 2020-06-10 NOTE — Interval H&P Note (Signed)
History and Physical Interval Note:  06/10/2020 7:22 AM  [name]  has presented today for surgery, with the diagnosis of right inguinal hernia.  The various methods of treatment have been discussed with the patient and family. After consideration of risks, benefits and other options for treatment, the patient has consented to  Procedure(s) with comments: RIGHT INGUINAL HERNIA WITH MESH (Right) - GENERAL AND TAP BLOCK as a surgical intervention.  The patient's history has been reviewed, patient examined, no change in status, stable for surgery.  I have reviewed the patient's chart and labs.  Questions were answered to the patient's satisfaction.     Dortha Schwalbe MD

## 2020-06-10 NOTE — Discharge Instructions (Signed)
[facility] _______[facility] ° °UMBILICAL OR INGUINAL HERNIA REPAIR: POST OP INSTRUCTIONS ° °Always review your discharge instruction sheet given to you by the facility where your surgery was performed. °IF YOU HAVE DISABILITY OR FAMILY LEAVE FORMS, YOU MUST BRING THEM TO THE OFFICE FOR PROCESSING.   °DO NOT GIVE THEM TO YOUR DOCTOR. ° °1. A  prescription for pain medication may be given to you upon discharge.  Take your pain medication as prescribed, if needed.  If narcotic pain medicine is not needed, then you may take acetaminophen (Tylenol) or ibuprofen (Advil) as needed. °2. Take your usually prescribed medications unless otherwise directed. °If you need a refill on your pain medication, please contact your pharmacy.  They will contact our office to request authorization. Prescriptions will not be filled after 5 pm or on week-ends. °3. You should follow a light diet the first 24 hours after arrival home, such as soup and crackers, etc.  Be sure to include lots of fluids daily.  Resume your normal diet the day after surgery. °4.Most patients will experience some swelling and bruising around the umbilicus or in the groin and scrotum.  Ice packs and reclining will help.  Swelling and bruising can take several days to resolve.  °6. It is common to experience some constipation if taking pain medication after surgery.  Increasing fluid intake and taking a stool softener (such as Colace) will usually help or prevent this problem from occurring.  A mild laxative (Milk of Magnesia or Miralax) should be taken according to package directions if there are no bowel movements after 48 hours. °7. Unless discharge instructions indicate otherwise, you may remove your bandages 24-48 hours after surgery, and you may shower at that time.  You may have steri-strips (small skin tapes) in place directly over the incision.  These strips should be left on the skin for 7-10 days.  If your surgeon used skin glue on the  incision, you may shower in 24 hours.  The glue will flake off over the next 2-3 weeks.  Any sutures or staples will be removed at the office during your follow-up visit. °8. ACTIVITIES:  You may resume regular (light) daily activities beginning the next day--such as daily self-care, walking, climbing stairs--gradually increasing activities as tolerated.  You may have sexual intercourse when it is comfortable.  Refrain from any heavy lifting or straining until approved by your doctor. ° °a.You may drive when you are no longer taking prescription pain medication, you can comfortably wear a seatbelt, and you can safely maneuver your car and apply brakes. °b.RETURN TO WORK:   °_____________________________________________ ° °9.You should see your doctor in the office for a follow-up appointment approximately 2-3 weeks after your surgery.  Make sure that you call for this appointment within a day or two after you arrive home to insure a convenient appointment time. °10.OTHER INSTRUCTIONS: _________________________ °   _____________________________________ ° °WHEN TO CALL YOUR DOCTOR: °1. Fever over 101.0 °2. Inability to urinate °3. Nausea and/or vomiting °4. Extreme swelling or bruising °5. Continued bleeding from incision. °6. Increased pain, redness, or drainage from the incision ° °The clinic staff is available to answer your questions during regular business hours.  Please don’t hesitate to call and ask to speak to one of the nurses for clinical concerns.  If you have a medical emergency, go to the nearest emergency room or call 911.  A surgeon from [facility] is always on call at the hospital ° ° °  [address], [address], [place], [place]  [address] ? ° [address] [address], [place], [place]   [address] °[phone] ? [phone] ? FAX [phone] °Web site: [email or web address] °

## 2020-06-10 NOTE — Anesthesia Procedure Notes (Signed)
Procedure Name: LMA Insertion Date/Time: 06/10/2020 7:39 AM Performed by: Elliot Dally, CRNA Pre-anesthesia Checklist: Patient identified, Emergency Drugs available, Suction available and Patient being monitored Patient Re-evaluated:Patient Re-evaluated prior to induction Oxygen Delivery Method: Circle System Utilized Preoxygenation: Pre-oxygenation with 100% oxygen Induction Type: IV induction Ventilation: Mask ventilation without difficulty LMA: LMA inserted LMA Size: 4.0 Number of attempts: 1 Airway Equipment and Method: Bite block Placement Confirmation: positive ETCO2 Tube secured with: Tape Dental Injury: Teeth and Oropharynx as per pre-operative assessment

## 2020-06-10 NOTE — Anesthesia Procedure Notes (Signed)
Anesthesia Regional Block: TAP block   Pre-Anesthetic Checklist: ,, timeout performed, Correct Patient, Correct Site, Correct Laterality, Correct Procedure, Correct Position, site marked, Risks and benefits discussed,  Surgical consent,  Pre-op evaluation,  At surgeon's request and post-op pain management  Laterality: Right  Prep: chloraprep       Needles:  Injection technique: Single-shot  Needle Type: Echogenic Stimulator Needle     Needle Length: 9cm  Needle Gauge: 21     Additional Needles:   Narrative:  Start time: 06/10/2020 7:10 AM End time: 06/10/2020 7:20 AM Injection made incrementally with aspirations every 5 mL.  Performed by: Personally  Anesthesiologist: Arta Bruce, MD  Additional Notes: Monitors applied. Patient sedated. Sterile prep and drape,hand hygiene and sterile gloves were used. Relevant anatomy identified.Needle position confirmed.Local anesthetic injected incrementally after negative aspiration. Local anesthetic spread visualized in Transversus Abdominus Plane. Vascular puncture avoided. No complications. Image printed for medical record.The patient tolerated the procedure well.

## 2020-06-10 NOTE — Transfer of Care (Signed)
Immediate Anesthesia Transfer of Care Note  Patient: [name]  Procedure(s) Performed: RIGHT INGUINAL HERNIA (Right Groin) INSERTION OF MESH (Right Groin)  Patient Location: PACU  Anesthesia Type:GA combined with regional for post-op pain  Level of Consciousness: drowsy  Airway & Oxygen Therapy: Patient Spontanous Breathing and Patient connected to nasal cannula oxygen  Post-op Assessment: Report given to RN and Post -op Vital signs reviewed and stable  Post vital signs: Reviewed and stable  Last Vitals:  Vitals Value Taken Time  BP 161/51 06/10/20 0900  Temp    Pulse 84 06/10/20 0901  Resp 22 06/10/20 0901  SpO2 100 % 06/10/20 0901  Vitals shown include unvalidated device data.  Last Pain:  Vitals:   06/10/20 0629  TempSrc:   PainSc: 0-No pain         Complications: No complications documented.

## 2020-06-10 NOTE — Op Note (Signed)
Right Hernia repair with ultrapro mesh , Open, Procedure Note  Indications: The patient presented with a history of a right,  Reducible inguinal  hernia.  The risk of hernia repair include bleeding,  Infection,   Recurrence of the hernia,  Mesh use, chronic pain,  Organ injury,  Bowel injury,  Bladder injury,   nerve injury with numbness around the incision,  Death,  and worsening of preexisting  medical problems.  The alternatives to surgery have been discussed as well..  Long term expectations of both operative and non operative treatments have been discussed.   The patient agrees to proceed.  Pre-operative Diagnosis: right inguinal hernia  reducible  Post-operative Diagnosis: same  Surgeon: Dortha Schwalbe  MD   Assistants: OR staff   Anesthesia: General endotracheal anesthesia and Local anesthesia 0.25.% bupivacaine, with epinephrine and TAP block   ASA Class: 2  Procedure Details  The patient was seen again in the Holding Room. The risks, benefits, complications, treatment options, and expected outcomes were discussed with the patient. The possibilities of reaction to medication, pulmonary aspiration, perforation of viscus, bleeding, recurrent infection, the need for additional procedures, and development of a complication requiring transfusion or further operation were discussed with the patient and/or family. There was concurrence with the proposed plan, and informed consent was obtained. The site of surgery was properly noted/marked. The patient was taken to the Operating Room, identified as [name], and the procedure verified as hernia repair. A Time Out was held and the above information confirmed.  The patient was placed in the supine position and underwent induction of anesthesia, the lower abdomen and groin was prepped and draped in the standard fashion, and 0.25% Marcaine with epinephrine was used to anesthetize the skin over the mid-portion of the inguinal canal. A  transverse incision was made. Dissection was carried through the soft tissue to expose the inguinal canal and inguinal ligament along its lower edge. The external oblique fascia was split along the course of its fibers, exposing the inguinal canal. The cord and nerve were looped using a Penrose drain and reflected out of the field. The indirect defect was exposed and a piece of prolene hernia system ultrapro mesh was and placed into  the defect. Interupted 1-0 novafil suture was then used  to repair the defect, with the suture being sewn from the pubic tubercle inferiorly and superiorly along the canal to a level just beyond the internal ring. The mesh was split to allow passage of the cord and nerve into the canal without entrapment.  The ilioinguinal was divided to prevent entrapment. The contents were then returned to canal and the external oblique fashion was then closed in a continuous fashion using 3-0 Vicryl suture taking care not to cause entrapment. Scarpa's layer closed with 3 0 vicryl and 4 0 monocryl used to close the skin.  Dermabond used for dressing.  Instrument, sponge, and needle counts were correct prior to closure and at the conclusion of the case.  Findings: Hernia as above  Estimated Blood Loss: Minimal         Drains: None         Total IV Fluids: per record          Specimens: none                Complications: None; patient tolerated the procedure well.         Disposition: PACU - hemodynamically stable.  Condition: stable

## 2020-06-10 NOTE — Anesthesia Postprocedure Evaluation (Signed)
Anesthesia Post Note  Patient: [name]  Procedure(s) Performed: RIGHT INGUINAL HERNIA (Right Groin) INSERTION OF MESH (Right Groin)     Patient location during evaluation: PACU Anesthesia Type: General Level of consciousness: awake and alert Pain management: pain level controlled Vital Signs Assessment: post-procedure vital signs reviewed and stable Respiratory status: spontaneous breathing, nonlabored ventilation, respiratory function stable and patient connected to nasal cannula oxygen Cardiovascular status: blood pressure returned to baseline and stable Postop Assessment: no apparent nausea or vomiting Anesthetic complications: no   No complications documented.  Last Vitals:  Vitals:   06/10/20 0915 06/10/20 0930  BP: (!) 155/59 (!) 150/75  Pulse: 80 80  Resp: 16 18  Temp:    SpO2: 94% 95%    Last Pain:  Vitals:   06/10/20 0915  TempSrc:   PainSc: 6                  Zyanna Leisinger DAVID

## 2020-06-11 ENCOUNTER — Encounter (HOSPITAL_COMMUNITY): Payer: Self-pay | Admitting: Surgery

## 2020-06-11 ENCOUNTER — Other Ambulatory Visit: Payer: Self-pay | Admitting: *Deleted

## 2020-06-11 NOTE — Patient Outreach (Signed)
[facility]) Care Management  06/11/2020  [name] 30-Jun-1941 [id number]   Notified by care management assistant that member called office inquiring about a call to his home.  Call placed to member in effort to complete assessment of needs (5th time), again unsuccessful.  HIPAA compliant voice message left, will not open case unless member returns call.  [name], [place], MSN [facility] Care Management  [facility] Ms Manager [phone]

## 2020-06-24 DIAGNOSIS — Z96641 Presence of right artificial hip joint: Secondary | ICD-10-CM | POA: Diagnosis not present

## 2020-06-24 DIAGNOSIS — R262 Difficulty in walking, not elsewhere classified: Secondary | ICD-10-CM | POA: Diagnosis not present

## 2020-06-26 ENCOUNTER — Other Ambulatory Visit (HOSPITAL_COMMUNITY): Payer: Self-pay | Admitting: Internal Medicine

## 2020-06-26 DIAGNOSIS — R262 Difficulty in walking, not elsewhere classified: Secondary | ICD-10-CM | POA: Diagnosis not present

## 2020-06-26 DIAGNOSIS — Z96641 Presence of right artificial hip joint: Secondary | ICD-10-CM | POA: Diagnosis not present

## 2020-06-27 DIAGNOSIS — Z125 Encounter for screening for malignant neoplasm of prostate: Secondary | ICD-10-CM | POA: Diagnosis not present

## 2020-06-27 DIAGNOSIS — E7800 Pure hypercholesterolemia, unspecified: Secondary | ICD-10-CM | POA: Diagnosis not present

## 2020-06-27 DIAGNOSIS — Z0000 Encounter for general adult medical examination without abnormal findings: Secondary | ICD-10-CM | POA: Diagnosis not present

## 2020-06-29 NOTE — Progress Notes (Signed)
Advanced Heart Failure Clinic Note   Date:  06/29/2020   ID:  [name], DOB September 24, 1940, MRN [id number]  Location: Home  Provider location: [place] [facility] Type of Visit: Established patient  PCP:  Tisovec, Adelfa Koh, MD  Cardiologist:  No primary care provider on file. Primary HF: Vanessia Bokhari  Chief Complaint: Heart Failure follow-up   History of Present Illness: [name] is a 79 y.o. male with history of CAD, multiple angioplasties to the RCA dating back to 1983 followed by CABG in 2012, HTN, and hyperlipidemia.  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  He was hospitalized in 11/11 because of recurrent chest pain. He underwent drug-eluting stent to the proximal RCA July 24, 2010. Ejection fraction was 65%. He also had 99% stenosis in the distal PDA with left to right collaterals and nonobstructive disease in the left coronary tree.  Admitted in October 2012 with [place]. Found to have progressive CAD. Underwent Coronary artery bypass grafting x4 (left internal mammary artery to LAD, saphenous vein graft to circumflex marginal, saphenous vein graft to posterior descending branch of the distal right coronary, saphenous vein graft to the posterolateral branch of the distal right coronary artery).   Had recurrent CP in 8/20 underwent cath which showed stable anatomy no targets for PCI  1. 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 LAD 100% 3. RCA 100% 4. LCX 50% in mid LCX 5. LIMA to LAD widely patent 6. SVG to R PDA widely patent 7. SVG to OM occluded ostially 8. SVG to PL occluded ostially Has moderate disease in anomalous LCX but not crittcal and anatomy precludes PCI. 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 He was discharged 04/23/20. On 04/25/20 he had AMS was sent to [facility] ED. HS Trop 111-->1421 -> 17,390. It was unclear if this was demand ischemia. Had LHC on  04/28/20 with no targets for revascularization and medical management. ECHO showed EF 40-45%.  Today he returns for follow up. Says he feels good. No CP or SOB. Still struggling with pain from his hernia repair and knee replacement. No edema, orthopnea or PND.  Carotid u/s with [facility] in 5/18: 5/18 RICA 40-59% and LICA 1-39%   Past Medical History:  Diagnosis Date  . Arthritis    hips  . CAD (coronary artery disease)    s/p multiple PCIs to RCA dating back to 43. Last stent to proximal RCA in November of 2011. Also had 99% stenosis in the distal PDA with left to right collaterals and nonobstructive disease in the left coronary tree. ;  ISR of RCA 10/12 - CABG 10/9 with Dr. Zenaida Niece Trigt: L-LAD, S-OM, S-PDA  . 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 (stable)  . GERD (gastroesophageal reflux disease)   . HTN (hypertension)   . Hyperlipidemia    Past Surgical History:  Procedure Laterality Date  . CARDIAC CATHETERIZATION    . Coronary artery bypass grafting x4 (left internal mammary artery  06/23/2011  . CORONARY STENT PLACEMENT  Nov 2011   prox RCA. Has had prior multiple procedures.  Rosalie Doctor ANGIOGRAPHY N/A 05/04/2019   Procedure: CORONARY/GRAFT ANGIOGRAPHY;  Surgeon: Dolores Patty, MD;  [facility]: [facility] INVASIVE CV LAB;  Service: Cardiovascular;  Laterality: N/A;  . INGUINAL HERNIA REPAIR Right 06/10/2020   Procedure: RIGHT INGUINAL HERNIA;  Surgeon: Harriette Bouillon, MD;  Location: [facility] OR;  Service: General;  Laterality: Right;  GENERAL AND TAP BLOCK  . INSERTION OF MESH Right 06/10/2020   Procedure: INSERTION OF MESH;  Surgeon: Harriette Bouillon, MD;  Location: [facility] OR;  Service: General;  Laterality: Right;  . JOINT REPLACEMENT    . LEFT HEART CATH AND CORS/GRAFTS ANGIOGRAPHY N/A 04/28/2020   Procedure: LEFT HEART CATH AND CORS/GRAFTS ANGIOGRAPHY;  Surgeon: Runell Gess, MD;  Location: [facility] INVASIVE CV LAB;  Service: Cardiovascular;  Laterality: N/A;  . left total  hip replacement  2008  . TONSILLECTOMY    . TOOTH EXTRACTION  06/2013  . TOTAL HIP ARTHROPLASTY Right 04/22/2020   Procedure: TOTAL HIP ARTHROPLASTY;  Surgeon: Teryl Lucy, MD;  Location: [facility] ORS;  Service: Orthopedics;  Laterality: Right;     Current Outpatient Medications  Medication Sig Dispense Refill  . amLODipine (NORVASC) 10 MG tablet TAKE 1 TABLET ONCE DAILY. (Patient taking differently: Take 10 mg by mouth daily. ) 90 tablet 3  . aspirin EC 325 MG tablet Take 1 tablet (325 mg total) by mouth 2 (two) times daily. 60 tablet 0  . atorvastatin (LIPITOR) 80 MG tablet TAKE ONE TABLET BY MOUTH DAILY (Patient taking differently: Take 80 mg by mouth daily. ) 90 tablet 3  . ezetimibe (ZETIA) 10 MG tablet TAKE 1 TABLET BY MOUTH DAILY. (Patient taking differently: Take 10 mg by mouth daily. ) 90 tablet 3  . losartan (COZAAR) 100 MG tablet Take 1 tablet (100 mg total) by mouth daily. NEEDS APPT 90 tablet 0  . metoprolol tartrate (LOPRESSOR) 50 MG tablet TAKE 1 TABLET BY MOUTH TWICE DAILY. 60 tablet 0  . nitroGLYCERIN (NITROSTAT) 0.4 MG SL tablet Place 0.4 mg under the tongue every 5 (five) minutes as needed for chest pain.     . Omega-3 Fatty Acids (FISH OIL) 1000 MG CAPS Take 2,000 mg by mouth daily.    . ondansetron (ZOFRAN) 4 MG tablet Take 1 tablet (4 mg total) by mouth every 8 (eight) hours as needed for nausea or vomiting. (Patient not taking: Reported on 05/08/2020) 10 tablet 0  . oxyCODONE (OXY IR/ROXICODONE) 5 MG immediate release tablet Take 1 tablet (5 mg total) by mouth every 6 (six) hours as needed for severe pain. 15 tablet 0  . oxyCODONE (ROXICODONE) 5 MG immediate release tablet Take 1 tablet (5 mg total) by mouth every 4 (four) hours as needed for severe pain. 30 tablet 0  . pantoprazole (PROTONIX) 40 MG tablet TAKE 1 TABLET ONCE DAILY. (Patient taking differently: Take 40 mg by mouth daily. ) 90 tablet 3  . sennosides-docusate sodium (SENOKOT-S) 8.6-50 MG tablet Take 2 tablets by  mouth daily. (Patient not taking: Reported on 05/08/2020) 30 tablet 1  . spironolactone (ALDACTONE) 25 MG tablet TAKE (1/2) TABLET DAILY. (Patient taking differently: Take 12.5 mg by mouth daily. ) 45 tablet 3  . Tamsulosin HCl (FLOMAX) 0.4 MG CAPS Take 0.8 mg by mouth at bedtime.      No current facility-administered medications for this encounter.    Allergies:   Morphine   Social History:  The patient  reports that he has never smoked. He has never used smokeless tobacco. He reports current alcohol use of about 6.0 standard drinks of alcohol per week. He reports that he does not use drugs.   Family History:  The patient's family history includes Heart disease in his father.   ROS:  Please see the history of present illness.   All other systems  are personally reviewed and negative.   Vitals:   06/30/20 1027  BP: (!) 150/70  Pulse: (!) 55  SpO2: 98%    Exam:   General:  Well appearing. No resp difficulty HEENT: normal Neck: supple. no JVD. Carotids 2+ bilat; no bruits. No lymphadenopathy or thryomegaly appreciated. Cor: PMI nondisplaced. Brady regular  No rubs, gallops or murmurs. Lungs: clear Abdomen: soft, nontender, nondistended. No hepatosplenomegaly. No bruits or masses. Good bowel sounds. Extremities: no cyanosis, clubbing, rash, 1-2+ edema Neuro: alert & orientedx3, cranial nerves grossly intact. moves all 4 extremities w/o difficulty. Affect pleasant  EKG: Sinus Brady 53 bpm Personally reviewed   Recent Labs: 04/25/2020: B Natriuretic Peptide 208.4 06/05/2020: ALT 17; BUN 16; Creatinine, Ser 0.82; Hemoglobin 11.9; Platelets 276; Potassium 4.1; Sodium 138  Personally reviewed   Wt Readings from Last 3 Encounters:  06/05/20 88.7 kg (195 lb 8 oz)  05/08/20 90.2 kg (198 lb 12.8 oz)  04/28/20 88.6 kg (195 lb 6.4 oz)      ASSESSMENT AND PLAN:  1. CAD  S/p CABG 2012 - Cath 04/2020 with stable revascularization - No s/s angina - Continue statin and ASA. Decrease  b-blocker due to bradycardia  2. 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 Mild volume overload - Switch losartan to Entresto 97/103 bid - Continue spiro 12.5 - Eventually switch losartan to carvedilol - F/u with PharmD in 2-3 weeks to consider addition of SGLT2i - Wear compression hose  3. HTN - Blood pressure very high (missed meds this am) - changes as above  4. Carotid stenosis - Stable 40-59% disease bilateral carotids. CCA with >50% hemodynamically significant plaque.  - Remains asymptomatic. Follows with Dr. Myra Gianotti  5. HL -Continue atorvastatin 80 mg daily  - LDL 66 in 8/21. Followed by Dr. Alfonse Alpers, MD  06/29/2020 3:19 PM  Advanced Heart Failure Clinic [facility] [address] [facility] [place] [address] [phone] (office) [phone] (fax)

## 2020-06-30 ENCOUNTER — Other Ambulatory Visit: Payer: Self-pay

## 2020-06-30 ENCOUNTER — Ambulatory Visit (HOSPITAL_COMMUNITY)
Admission: RE | Admit: 2020-06-30 | Discharge: 2020-06-30 | Disposition: A | Discharge status: Home / Self Care | Payer: Medicare HMO | Source: Ambulatory Visit | Attending: Internal Medicine | Admitting: Internal Medicine

## 2020-06-30 ENCOUNTER — Encounter (HOSPITAL_COMMUNITY): Payer: Self-pay | Admitting: Internal Medicine

## 2020-06-30 VITALS — BP 190/60 | HR 55 | Ht 67.0 in | Wt 191.8 lb

## 2020-06-30 DIAGNOSIS — Z951 Presence of aortocoronary bypass graft: Secondary | ICD-10-CM | POA: Insufficient documentation

## 2020-06-30 DIAGNOSIS — E785 Hyperlipidemia, unspecified: Secondary | ICD-10-CM | POA: Insufficient documentation

## 2020-06-30 DIAGNOSIS — I2510 Atherosclerotic heart disease of native coronary artery without angina pectoris: Secondary | ICD-10-CM | POA: Diagnosis not present

## 2020-06-30 DIAGNOSIS — Z79899 Other long term (current) drug therapy: Secondary | ICD-10-CM | POA: Diagnosis not present

## 2020-06-30 DIAGNOSIS — Z885 Allergy status to narcotic agent status: Secondary | ICD-10-CM | POA: Insufficient documentation

## 2020-06-30 DIAGNOSIS — Z8249 Family history of ischemic heart disease and other diseases of the circulatory system: Secondary | ICD-10-CM | POA: Insufficient documentation

## 2020-06-30 DIAGNOSIS — I255 Ischemic cardiomyopathy: Secondary | ICD-10-CM | POA: Diagnosis not present

## 2020-06-30 DIAGNOSIS — I5022 Chronic systolic (congestive) heart failure: Secondary | ICD-10-CM | POA: Diagnosis not present

## 2020-06-30 DIAGNOSIS — I6523 Occlusion and stenosis of bilateral carotid arteries: Secondary | ICD-10-CM | POA: Insufficient documentation

## 2020-06-30 DIAGNOSIS — Z7982 Long term (current) use of aspirin: Secondary | ICD-10-CM | POA: Insufficient documentation

## 2020-06-30 DIAGNOSIS — M160 Bilateral primary osteoarthritis of hip: Secondary | ICD-10-CM | POA: Insufficient documentation

## 2020-06-30 DIAGNOSIS — Z96641 Presence of right artificial hip joint: Secondary | ICD-10-CM | POA: Diagnosis not present

## 2020-06-30 DIAGNOSIS — Z955 Presence of coronary angioplasty implant and graft: Secondary | ICD-10-CM | POA: Insufficient documentation

## 2020-06-30 DIAGNOSIS — E7849 Other hyperlipidemia: Secondary | ICD-10-CM | POA: Diagnosis not present

## 2020-06-30 DIAGNOSIS — I10 Essential (primary) hypertension: Secondary | ICD-10-CM

## 2020-06-30 DIAGNOSIS — I110 Hypertensive heart disease with heart failure: Secondary | ICD-10-CM | POA: Insufficient documentation

## 2020-06-30 DIAGNOSIS — K219 Gastro-esophageal reflux disease without esophagitis: Secondary | ICD-10-CM | POA: Insufficient documentation

## 2020-06-30 MED ORDER — METOPROLOL TARTRATE 25 MG PO TABS: 25.0000 mg | ORAL_TABLET | Freq: Two times a day (BID) | ORAL | 6 refills | Status: DC

## 2020-06-30 MED ORDER — ENTRESTO 97-103 MG PO TABS: 1.0000 | ORAL_TABLET | Freq: Two times a day (BID) | ORAL | 6 refills | Status: DC

## 2020-06-30 NOTE — Patient Instructions (Signed)
Stop Losartan  Start Entresto 97/103 mg Twice daily   Decrease Metoprolol to 25 mg Twice daily   Please wear your compression hose daily, place them on as soon as you get up in the morning and remove before you go to bed at night.  Please follow up with our heart failure pharmacist in 2-3 weeks  Please call our office in March 2022 to schedule your follow up appointment and echocardiogram  If you have any questions or concerns before your next appointment please send [place] a message through [place] or call our office at [phone].    TO LEAVE A MESSAGE FOR THE NURSE SELECT OPTION 2, PLEASE LEAVE A MESSAGE INCLUDING: . YOUR NAME . DATE OF BIRTH . CALL BACK NUMBER . REASON FOR CALL**this is important as we prioritize the call backs  YOU WILL RECEIVE A CALL BACK THE SAME DAY AS LONG AS YOU CALL BEFORE 4:00 PM  At the Advanced Heart Failure Clinic, you and your health needs are our priority. As part of our continuing mission to provide you with exceptional heart care, we have created designated Provider Care Teams. These Care Teams include your primary Cardiologist (physician) and Advanced Practice Providers (APPs- Physician Assistants and Nurse Practitioners) who all work together to provide you with the care you need, when you need it.   You may see any of the following providers on your designated Care Team at your next follow up: Marland Kitchen Dr Arvilla Meres . Dr Marca Ancona . Tonye Becket, NP . Robbie Lis, PA . Karle Plumber, PharmD   Please be sure to bring in all your medications bottles to every appointment.

## 2020-06-30 NOTE — Addendum Note (Signed)
Encounter addended by: Noralee Space, RN on: 06/30/2020 11:44 AM  Actions taken: Order list changed, Diagnosis association updated

## 2020-07-01 DIAGNOSIS — Z96641 Presence of right artificial hip joint: Secondary | ICD-10-CM | POA: Diagnosis not present

## 2020-07-01 DIAGNOSIS — R262 Difficulty in walking, not elsewhere classified: Secondary | ICD-10-CM | POA: Diagnosis not present

## 2020-07-02 DIAGNOSIS — M1611 Unilateral primary osteoarthritis, right hip: Secondary | ICD-10-CM | POA: Diagnosis not present

## 2020-07-03 DIAGNOSIS — Z96641 Presence of right artificial hip joint: Secondary | ICD-10-CM | POA: Diagnosis not present

## 2020-07-03 DIAGNOSIS — R262 Difficulty in walking, not elsewhere classified: Secondary | ICD-10-CM | POA: Diagnosis not present

## 2020-07-04 DIAGNOSIS — Z0000 Encounter for general adult medical examination without abnormal findings: Secondary | ICD-10-CM | POA: Diagnosis not present

## 2020-07-04 DIAGNOSIS — E7800 Pure hypercholesterolemia, unspecified: Secondary | ICD-10-CM | POA: Diagnosis not present

## 2020-07-04 DIAGNOSIS — M1990 Unspecified osteoarthritis, unspecified site: Secondary | ICD-10-CM | POA: Diagnosis not present

## 2020-07-04 DIAGNOSIS — I214 Non-ST elevation (NSTEMI) myocardial infarction: Secondary | ICD-10-CM | POA: Diagnosis not present

## 2020-07-04 DIAGNOSIS — K4090 Unilateral inguinal hernia, without obstruction or gangrene, not specified as recurrent: Secondary | ICD-10-CM | POA: Diagnosis not present

## 2020-07-04 DIAGNOSIS — D692 Other nonthrombocytopenic purpura: Secondary | ICD-10-CM | POA: Diagnosis not present

## 2020-07-04 DIAGNOSIS — N401 Enlarged prostate with lower urinary tract symptoms: Secondary | ICD-10-CM | POA: Diagnosis not present

## 2020-07-04 DIAGNOSIS — I119 Hypertensive heart disease without heart failure: Secondary | ICD-10-CM | POA: Diagnosis not present

## 2020-07-04 DIAGNOSIS — D638 Anemia in other chronic diseases classified elsewhere: Secondary | ICD-10-CM | POA: Diagnosis not present

## 2020-07-04 DIAGNOSIS — I2510 Atherosclerotic heart disease of native coronary artery without angina pectoris: Secondary | ICD-10-CM | POA: Diagnosis not present

## 2020-07-08 DIAGNOSIS — R262 Difficulty in walking, not elsewhere classified: Secondary | ICD-10-CM | POA: Diagnosis not present

## 2020-07-08 DIAGNOSIS — Z96641 Presence of right artificial hip joint: Secondary | ICD-10-CM | POA: Diagnosis not present

## 2020-07-08 NOTE — Progress Notes (Signed)
PCP:  Gaspar Garbe, MD           Cardiologist:  No primary care provider on file. Primary HF: Dr. Gala Romney  HPI:  [name] a 79 y.o.malewith history of CAD, multiple angioplasties to the RCA dating back to 1983 followed by CABG in 2012, HTN, and hyperlipidemia. 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  He was hospitalized in November 2011 because of recurrent chest pain. He underwent drug-eluting stent to the proximal RCA July 24, 2010. Ejection fraction was 65%. He also had 99% stenosis in the distal PDA with left to right collaterals and nonobstructive disease in the left coronary tree.  He was hospitalized in October 2012 with [place]. Found to have progressive CAD. Underwent Coronary artery bypass grafting x4 (left internal mammary artery to LAD, saphenous vein graft to circumflex marginal, saphenous vein graft to posterior descending branch of the distal right coronary, saphenous vein graft to the posterolateral branch of the distal right coronary artery).   Had recurrent CP in 8/20 underwent cath which showed stable anatomy with no targets for PCI.  He is s/p R hip replacement on 04/22/20. He was discharged on 04/23/20. On 04/25/20, he had AMS and was sent to [facility] ED. HS Trop 111-->1421 -> 17,390. It was unclear if this was demand ischemia. Had LHC on 04/28/20 with no targets for revascularization and medical management. ECHO showed EF 40-45%.  He recently returned for follow up with Dr. Gala Romney on 06/30/20. He reported feeling good with no CP or SOB. He was still struggling with pain from his hernia repair and knee replacement. Did not have edema, orthopnea or PND.    Today he returns to HF clinic for pharmacist medication titration. At last visit with MD, losartan was switched to Entresto 97/103 mg BID and metoprolol tartrate was decreased to 25 mg BID due to bradycardia. Today, he is feeling good overall. Denies dizziness,  lightheadedness, fatigue, chest pain, or palpitations. He reports his breathing is great, and he is able to walk 0.5 mile before feeling SOB. He has not fully recovered from his hip replacement and states that his LEE that started post-surgery is still present but stable. He is wearing compression hose. Denies PND/orthopnea and sleeps with 1 pillow. His appetite is good and he follows a low-salt diet.  HF Medications: Metoprolol tartrate 25 mg BID Entresto 97/103 mg BID  Spironolactone 12.5 mg daily  Has the patient been experiencing any side effects to the medications prescribed?  no  Does the patient have any problems obtaining medications due to transportation or finances?   No - [employer] Medicare  Understanding of regimen: good Understanding of indications: good Potential of compliance: good Patient understands to avoid NSAIDs. Patient understands to avoid decongestants.    Pertinent Lab Values: . Serum creatinine 0.96, BUN 16, Potassium 4.5, Sodium 138  Vital Signs: . Weight: 199.6 lbs (last clinic weight: 191 lbs) . Blood pressure: 170/62  . Heart rate: 51   Assessment: 1. CAD S/p CABG 2012 - Cath 04/2020 with stable revascularization - No s/s angina - Continue statin and ASA. - Change metoprolol tartrate to metoprolol succinate for HF benefit (see below)  2. 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 Mild volume overload that is stable. - Stop metoprolol tartrate  - Start metoprolol succinate 25 mg daily for added HF mortality benefit. Start at lower  dose given bradycardia. - Continue Entresto 97/103 mg BID - Increase spironolactone to 25 mg daily. Repeat BMET in 1 week. - Consider addition of SGLT2i at next visit - Wear compression hose  3. HTN - Blood pressure remains high at 170/62 - Changes as above  4. Carotid stenosis - Stable 40-59% disease bilateral carotids. CCA with >50% hemodynamically significant  plaque. -Remains asymptomatic.Follows with Dr. Myra Gianotti  5. HLD - Continue atorvastatin 80 mg daily  - LDL 66 in 8/21. Followed by Dr. Wylene Simmer   Plan: 1) Medication changes: Based on clinical presentation, vital signs and recent labs will increase spironolactone to 25 mg daily and change metoprolol tartrate to metoprolol succinate 25 mg daily 2) Labs: Follow-up BMET on 07/30/20 3) Follow-up with pharmacy clinic on 08/20/20  Tama Headings, PharmD PGY2 Cardiology Pharmacy Resident  Karle Plumber, PharmD, BCPS, [facility], CPP Heart Failure Clinic Pharmacist [phone]

## 2020-07-09 ENCOUNTER — Other Ambulatory Visit (HOSPITAL_COMMUNITY): Payer: Self-pay | Admitting: Internal Medicine

## 2020-07-12 ENCOUNTER — Ambulatory Visit: Payer: Medicare HMO | Attending: Internal Medicine

## 2020-07-12 DIAGNOSIS — Z23 Encounter for immunization: Secondary | ICD-10-CM

## 2020-07-12 NOTE — Progress Notes (Signed)
   Covid-19 Vaccination Clinic  Name:  [name]    MRN: [id number] DOB: 24-Mar-1941  07/12/2020  [name] was observed post Covid-19 immunization for 15 minutes without incident. He was provided with Vaccine Information Sheet and instruction to access the V-Safe system.   [name] was instructed to call 911 with any severe reactions post vaccine: Marland Kitchen Difficulty breathing  . Swelling of face and throat  . A fast heartbeat  . A bad rash all over body  . Dizziness and weakness

## 2020-07-22 ENCOUNTER — Ambulatory Visit (HOSPITAL_COMMUNITY)
Admission: RE | Admit: 2020-07-22 | Discharge: 2020-07-22 | Disposition: A | Discharge status: Home / Self Care | Payer: Medicare HMO | Source: Ambulatory Visit | Attending: Cardiology | Admitting: Cardiology

## 2020-07-22 ENCOUNTER — Other Ambulatory Visit: Payer: Self-pay

## 2020-07-22 VITALS — BP 170/62 | HR 51 | Wt 199.6 lb

## 2020-07-22 DIAGNOSIS — E785 Hyperlipidemia, unspecified: Secondary | ICD-10-CM | POA: Insufficient documentation

## 2020-07-22 DIAGNOSIS — Z951 Presence of aortocoronary bypass graft: Secondary | ICD-10-CM | POA: Insufficient documentation

## 2020-07-22 DIAGNOSIS — I6523 Occlusion and stenosis of bilateral carotid arteries: Secondary | ICD-10-CM | POA: Insufficient documentation

## 2020-07-22 DIAGNOSIS — I110 Hypertensive heart disease with heart failure: Secondary | ICD-10-CM | POA: Insufficient documentation

## 2020-07-22 DIAGNOSIS — I10 Essential (primary) hypertension: Secondary | ICD-10-CM

## 2020-07-22 DIAGNOSIS — I739 Peripheral vascular disease, unspecified: Secondary | ICD-10-CM

## 2020-07-22 DIAGNOSIS — I5020 Unspecified systolic (congestive) heart failure: Secondary | ICD-10-CM | POA: Diagnosis not present

## 2020-07-22 DIAGNOSIS — I214 Non-ST elevation (NSTEMI) myocardial infarction: Secondary | ICD-10-CM

## 2020-07-22 DIAGNOSIS — I255 Ischemic cardiomyopathy: Secondary | ICD-10-CM | POA: Diagnosis not present

## 2020-07-22 DIAGNOSIS — I2510 Atherosclerotic heart disease of native coronary artery without angina pectoris: Secondary | ICD-10-CM | POA: Insufficient documentation

## 2020-07-22 LAB — BASIC METABOLIC PANEL
Anion gap: 8 (ref 5–15)
BUN: 16 mg/dL (ref 8–23)
CO2: 24 mmol/L (ref 22–32)
Calcium: 9.2 mg/dL (ref 8.9–10.3)
Chloride: 106 mmol/L (ref 98–111)
Creatinine, Ser: 0.96 mg/dL (ref 0.61–1.24)
GFR, Estimated: >60 mL/min (ref >60)
Glucose, Bld: 108 mg/dL — ABNORMAL HIGH (ref 70–99)
Potassium: 4.5 mmol/L (ref 3.5–5.1)
Sodium: 138 mmol/L (ref 135–145)

## 2020-07-22 MED ORDER — METOPROLOL SUCCINATE ER 25 MG PO TB24: 25.0000 mg | ORAL_TABLET | Freq: Every day | ORAL | 3 refills | Status: DC

## 2020-07-22 MED ORDER — SPIRONOLACTONE 25 MG PO TABS
25.0000 mg | ORAL_TABLET | Freq: Every day | ORAL | 3 refills | Status: DC
Start: 2020-07-22 — End: 2021-09-03

## 2020-07-22 NOTE — Patient Instructions (Addendum)
It was a pleasure seeing you today!  MEDICATIONS: -We are changing your medications today  -Stop metoprolol tartrate  - Start metoprolol succinate 25 mg (1 tablet) daily - Increase spironolactone to 25 mg (1 tablet) daily -Call if you have questions about your medications.  LABS: -We will call you if your labs need attention.  NEXT APPOINTMENT: Return to clinic on 11/17 at 9 AM for lab appointment. Return to clinic on 08/20/20 at 10 AM with Pharmacy Clinic.  In general, to take care of your heart failure: -Limit your fluid intake to 2 Liters (half-gallon) per day.   -Limit your salt intake to ideally 2-3 grams (2000-3000 mg) per day. -Weigh yourself daily and record, and bring that "weight diary" to your next appointment.  (Weight gain of 2-3 pounds in 1 day typically means fluid weight.) -The medications for your heart are to help your heart and help you live longer.   -Please contact us before stopping any of your heart medications.  Call the clinic at [phone] with questions or to reschedule future appointments.

## 2020-07-30 ENCOUNTER — Other Ambulatory Visit: Payer: Self-pay

## 2020-07-30 ENCOUNTER — Ambulatory Visit (HOSPITAL_COMMUNITY)
Admission: RE | Admit: 2020-07-30 | Discharge: 2020-07-30 | Disposition: A | Discharge status: Home / Self Care | Payer: Medicare HMO | Source: Ambulatory Visit | Attending: Internal Medicine | Admitting: Internal Medicine

## 2020-07-30 DIAGNOSIS — I10 Essential (primary) hypertension: Secondary | ICD-10-CM | POA: Insufficient documentation

## 2020-07-30 LAB — BASIC METABOLIC PANEL
Anion gap: 8 (ref 5–15)
BUN: 16 mg/dL (ref 8–23)
CO2: 24 mmol/L (ref 22–32)
Calcium: 9.5 mg/dL (ref 8.9–10.3)
Chloride: 107 mmol/L (ref 98–111)
Creatinine, Ser: 0.88 mg/dL (ref 0.61–1.24)
GFR, Estimated: >60 mL/min (ref >60)
Glucose, Bld: 113 mg/dL — ABNORMAL HIGH (ref 70–99)
Potassium: 4.1 mmol/L (ref 3.5–5.1)
Sodium: 139 mmol/L (ref 135–145)

## 2020-08-06 NOTE — Progress Notes (Signed)
PCP:  Gaspar Garbe, MD           Cardiologist:  No primary care provider on file. Primary HF: Dr. Gala Romney  HPI:  [name] a 79 y.o.malewith history of CAD, multiple angioplasties to the RCA dating back to 1983 followed by CABG in 2012, HTN, and hyperlipidemia. 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  He was hospitalized in November 2011 because of recurrent chest pain. He underwent drug-eluting stent to the proximal RCA July 24, 2010. Ejection fraction was 65%. He also had 99% stenosis in the distal PDA with left to right collaterals and nonobstructive disease in the left coronary tree.  He was hospitalized in October 2012 with [place]. Found to have progressive CAD. Underwent Coronary artery bypass grafting x4 (left internal mammary artery to LAD, saphenous vein graft to circumflex marginal, saphenous vein graft to posterior descending branch of the distal right coronary, saphenous vein graft to the posterolateral branch of the distal right coronary artery).   Had recurrent CP in 8/20 underwent cath which showed stable anatomy with no targets for PCI.  He is s/p R hip replacement on 04/22/20. He was discharged on 04/23/20. On 04/25/20, he had AMS and was sent to [facility] ED. HS Trop 111-->1421 -> 17,390. It was unclear if this was demand ischemia. Had LHC on 04/28/20 with no targets for revascularization and medical management. ECHO showed EF 40-45%.  He recently returned for follow up with Dr. Gala Romney on 06/30/20. He reported feeling good with no CP or SOB. He was still struggling with pain from his hernia repair and knee replacement. Did not have edema, orthopnea or PND.    He recently returned for follow up with HF clinic for pharmacist medication titration. Was feeling good overall. Denied dizziness, lightheadedness, fatigue, chest pain, or palpitations. He reported his breathing was great, and he was able to walk 0.5 mile before  feeling SOB. He had not fully recovered from his hip replacement and stated that his LEE that started post-surgery was still present but stable. He was wearing compression hose. Denied PND/orthopnea and sleeps with 1 pillow. His appetite was good and he followed a low-salt diet.  Today he returns to HF clinic for pharmacist medication titration. At last visit with pharmacy clinic, metoprolol tartrate was changed to metoprolol succinate 25 mg daily and spironolactone was increased to 25 mg daily. Overall he is feeling well today. No dizziness, lightheadedness, chest pain or palpitations. No SOB/DOE. Activity level is limited by hip pain. His weight has been stable at 190-195 lbs at home. He is not taking any diuretic. Trace bilateral LEE which is controlled by compression stockings. No PND or orthopnea. Taking all medications as prescribed and tolerating all medications.     HF Medications: Metoprolol succinate 25 mg daily Entresto 97/103 mg BID  Spironolactone 25 mg daily  Has the patient been experiencing any side effects to the medications prescribed?  no  Does the patient have any problems obtaining medications due to transportation or finances?   No - [employer] Medicare. PAN for HF fund pending.   Understanding of regimen: good Understanding of indications: good Potential of compliance: good Patient understands to avoid NSAIDs. Patient understands to avoid decongestants.    Pertinent Lab Values (07/30/20): Marland Kitchen Serum creatinine 0.88, BUN 16, Potassium 4.1, Sodium 139  Vital Signs: . Weight: 194.2 lbs (last clinic weight: 199.6 lbs) . Blood pressure: 160/62 . Heart rate: 61   Assessment:  1. CAD S/p CABG 2012 - Cath 04/2020 with stable revascularization - No s/s angina - Continue statin and ASA. - Continue  2. 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 Wears compression stockings three times a week.  -  Continue metoprolol succinate 25 mg daily - Continue Entresto 97/103 mg BID - Continue spironolactone 25 mg daily. - Start Farxiga 10 mg daily.  - Wear compression hose  3. HTN - Blood pressure remains high at 160/62, but only took his morning medications 20 minutes ago.  - Changes as above  4. Carotid stenosis - Stable 40-59% disease bilateral carotids. CCA with >50% hemodynamically significant plaque. -Remains asymptomatic.Follows with Dr. Myra Gianotti  5. HLD - Continue atorvastatin 80 mg daily  - LDL 66 in 8/21. Followed by Dr. Wylene Simmer   Plan: 1) Medication changes: Based on clinical presentation, vital signs and recent labs will start Farxiga 10 mg daily.  2) Follow-up with Dr. Gala Romney with echo 11/2020.   Karle Plumber, PharmD, BCPS, BCCP, CPP Heart Failure Clinic Pharmacist [phone]

## 2020-08-12 ENCOUNTER — Telehealth (HOSPITAL_COMMUNITY): Payer: Self-pay | Admitting: *Deleted

## 2020-08-12 NOTE — Telephone Encounter (Signed)
Pt left VM c/o swelling in ankles. I called pt back no answer.

## 2020-08-13 DIAGNOSIS — M1611 Unilateral primary osteoarthritis, right hip: Secondary | ICD-10-CM | POA: Diagnosis not present

## 2020-08-20 ENCOUNTER — Ambulatory Visit (HOSPITAL_COMMUNITY)
Admission: RE | Admit: 2020-08-20 | Discharge: 2020-08-20 | Disposition: A | Discharge status: Home / Self Care | Payer: Medicare HMO | Source: Ambulatory Visit | Attending: Cardiology | Admitting: Cardiology

## 2020-08-20 ENCOUNTER — Other Ambulatory Visit: Payer: Self-pay

## 2020-08-20 VITALS — BP 160/62 | HR 61 | Wt 194.2 lb

## 2020-08-20 DIAGNOSIS — I110 Hypertensive heart disease with heart failure: Secondary | ICD-10-CM | POA: Diagnosis not present

## 2020-08-20 DIAGNOSIS — Z5181 Encounter for therapeutic drug level monitoring: Secondary | ICD-10-CM | POA: Insufficient documentation

## 2020-08-20 DIAGNOSIS — Z955 Presence of coronary angioplasty implant and graft: Secondary | ICD-10-CM | POA: Diagnosis not present

## 2020-08-20 DIAGNOSIS — R600 Localized edema: Secondary | ICD-10-CM | POA: Insufficient documentation

## 2020-08-20 DIAGNOSIS — Z96641 Presence of right artificial hip joint: Secondary | ICD-10-CM | POA: Insufficient documentation

## 2020-08-20 DIAGNOSIS — Z79899 Other long term (current) drug therapy: Secondary | ICD-10-CM | POA: Insufficient documentation

## 2020-08-20 DIAGNOSIS — Z951 Presence of aortocoronary bypass graft: Secondary | ICD-10-CM | POA: Diagnosis not present

## 2020-08-20 DIAGNOSIS — I5022 Chronic systolic (congestive) heart failure: Secondary | ICD-10-CM | POA: Diagnosis not present

## 2020-08-20 DIAGNOSIS — E785 Hyperlipidemia, unspecified: Secondary | ICD-10-CM | POA: Diagnosis not present

## 2020-08-20 DIAGNOSIS — I2510 Atherosclerotic heart disease of native coronary artery without angina pectoris: Secondary | ICD-10-CM | POA: Insufficient documentation

## 2020-08-20 DIAGNOSIS — I6523 Occlusion and stenosis of bilateral carotid arteries: Secondary | ICD-10-CM | POA: Insufficient documentation

## 2020-08-20 MED ORDER — DAPAGLIFLOZIN PROPANEDIOL 10 MG PO TABS: 10.0000 mg | ORAL_TABLET | Freq: Every day | ORAL | 11 refills | Status: DC

## 2020-08-20 NOTE — Patient Instructions (Signed)
It was a pleasure seeing you today!  MEDICATIONS: -We are changing your medications today -Start Farxiga 10 mg (1 tablet) daily -Call if you have questions about your medications.   NEXT APPOINTMENT: Return to clinic in 3 months with Dr. Gala Romney. Please call the clinic at [phone] to schedule appointment with Dr. Berlinda Last and echo.  In general, to take care of your heart failure: -Limit your fluid intake to 2 Liters (half-gallon) per day.   -Limit your salt intake to ideally 2-3 grams (2000-3000 mg) per day. -Weigh yourself daily and record, and bring that "weight diary" to your next appointment.  (Weight gain of 2-3 pounds in 1 day typically means fluid weight.) -The medications for your heart are to help your heart and help you live longer.   -Please contact us before stopping any of your heart medications.  Call the clinic at [phone] with questions or to reschedule future appointments.

## 2020-09-19 DIAGNOSIS — H524 Presbyopia: Secondary | ICD-10-CM | POA: Diagnosis not present

## 2020-09-19 DIAGNOSIS — Z0100 Encounter for examination of eyes and vision without abnormal findings: Secondary | ICD-10-CM | POA: Diagnosis not present

## 2020-09-19 DIAGNOSIS — H2513 Age-related nuclear cataract, bilateral: Secondary | ICD-10-CM | POA: Diagnosis not present

## 2020-09-25 ENCOUNTER — Other Ambulatory Visit: Payer: Self-pay | Admitting: *Deleted

## 2020-09-25 DIAGNOSIS — I6523 Occlusion and stenosis of bilateral carotid arteries: Secondary | ICD-10-CM

## 2020-10-08 ENCOUNTER — Ambulatory Visit (INDEPENDENT_AMBULATORY_CARE_PROVIDER_SITE_OTHER): Payer: Medicare HMO | Admitting: Physician Assistant

## 2020-10-08 ENCOUNTER — Other Ambulatory Visit: Payer: Self-pay | Admitting: *Deleted

## 2020-10-08 ENCOUNTER — Other Ambulatory Visit: Payer: Self-pay

## 2020-10-08 ENCOUNTER — Ambulatory Visit (HOSPITAL_COMMUNITY)
Admission: RE | Admit: 2020-10-08 | Discharge: 2020-10-08 | Disposition: A | Discharge status: Home / Self Care | Payer: Medicare HMO | Source: Ambulatory Visit | Attending: Physician Assistant | Admitting: Physician Assistant

## 2020-10-08 VITALS — BP 175/71 | HR 54 | Temp 98.3°F | Resp 20 | Ht 67.0 in | Wt 197.0 lb

## 2020-10-08 DIAGNOSIS — Z8249 Family history of ischemic heart disease and other diseases of the circulatory system: Secondary | ICD-10-CM

## 2020-10-08 DIAGNOSIS — I6523 Occlusion and stenosis of bilateral carotid arteries: Secondary | ICD-10-CM

## 2020-10-08 NOTE — Progress Notes (Signed)
HISTORY AND PHYSICAL     CC:  follow up. Requesting Provider:  Gaspar Garbe, MD  HPI: This is a 80 y.o. [name] here for follow up for carotid artery stenosis.    Pt was last seen July 2021 and at that time He was not having any stroke sx.  He had 60-79% bilateral ICA stenosis and was scheduled for 6 month follow up.   Pt has hx of CAD with multiple angioplasties as well as CABG in 2012, HTN, HLD.  He is also followed by Dr. Garth Schlatter for CHF.    Pt returns today for follow up.    Pt denies any amaurosis fugax, speech difficulties, weakness, numbness, paralysis or clumsiness or facial droop.  He states that he had his hip replaced in August and after he had gotten home, he did have some altered mental status where he did not know people's names.  He did have a CT of the head without contrast but this did not reveal any acute intracranial abnormality but did show atrophy and mild chronic small vessel ischemic changes in the white matter.  He states it is not known the reason for this.  He has also had a hernia repair in September 2021.    Pt does have family hx of AAA.  He states his mother had a AAA but did not die from this.  She lived to be 94.  His father had hx of CHF and MI.  Pt states he has chronic back pain he feels is due to arthritis.    Pt does have BLE swelling since his bilateral GSV harvest for CABG.  He states he has some numbness around the lower leg since then but this has improved over time.   The pt is on a statin for cholesterol management.  The pt is on a daily aspirin.   Other AC:  none The pt is on ACEI, BB, CCB for hypertension.   The pt is not diabetic.   Tobacco hx:  never  .  Past Medical History:  Diagnosis Date  . Arthritis    hips  . CAD (coronary artery disease)    s/p multiple PCIs to RCA dating back to 32. Last stent to proximal RCA in November of 2011. Also had 99% stenosis in the distal PDA with left to right collaterals and nonobstructive  disease in the left coronary tree. ;  ISR of RCA 10/12 - CABG 10/9 with Dr. Zenaida Niece Trigt: L-LAD, S-OM, S-PDA  . 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 (stable)  . GERD (gastroesophageal reflux disease)   . HTN (hypertension)   . Hyperlipidemia     Past Surgical History:  Procedure Laterality Date  . CARDIAC CATHETERIZATION    . Coronary artery bypass grafting x4 (left internal mammary artery  06/23/2011  . CORONARY STENT PLACEMENT  Nov 2011   prox RCA. Has had prior multiple procedures.  Rosalie Doctor ANGIOGRAPHY N/A 05/04/2019   Procedure: CORONARY/GRAFT ANGIOGRAPHY;  Surgeon: Dolores Patty, MD;  Location: [facility] INVASIVE CV LAB;  Service: Cardiovascular;  Laterality: N/A;  . INGUINAL HERNIA REPAIR Right 06/10/2020   Procedure: RIGHT INGUINAL HERNIA;  Surgeon: Harriette Bouillon, MD;  Location: [facility] OR;  Service: General;  Laterality: Right;  GENERAL AND TAP BLOCK  . INSERTION OF MESH Right 06/10/2020   Procedure: INSERTION OF MESH;  Surgeon: Harriette Bouillon, MD;  Location: [facility] OR;  Service: General;  Laterality: Right;  . JOINT REPLACEMENT    .  LEFT HEART CATH AND CORS/GRAFTS ANGIOGRAPHY N/A 04/28/2020   Procedure: LEFT HEART CATH AND CORS/GRAFTS ANGIOGRAPHY;  Surgeon: Runell Gess, MD;  Location: [facility] INVASIVE CV LAB;  Service: Cardiovascular;  Laterality: N/A;  . left total hip replacement  2008  . TONSILLECTOMY    . TOOTH EXTRACTION  06/2013  . TOTAL HIP ARTHROPLASTY Right 04/22/2020   Procedure: TOTAL HIP ARTHROPLASTY;  Surgeon: Teryl Lucy, MD;  Location: [facility] ORS;  Service: Orthopedics;  Laterality: Right;    Allergies  Allergen Reactions  . Morphine Nausea Only    Current Outpatient Medications  Medication Sig Dispense Refill  . amLODipine (NORVASC) 10 MG tablet TAKE 1 TABLET ONCE DAILY. (Patient taking differently: Take 10 mg by mouth daily. ) 90 tablet 3  . aspirin 81 MG chewable tablet Chew 81 mg by mouth daily.    Marland Kitchen atorvastatin (LIPITOR) 80 MG tablet  TAKE ONE TABLET BY MOUTH DAILY (Patient taking differently: Take 80 mg by mouth daily. ) 90 tablet 3  . dapagliflozin propanediol (FARXIGA) 10 MG TABS tablet Take 1 tablet (10 mg total) by mouth daily before breakfast. 30 tablet 11  . ezetimibe (ZETIA) 10 MG tablet TAKE 1 TABLET BY MOUTH DAILY. (Patient taking differently: Take 10 mg by mouth daily. ) 90 tablet 3  . metoprolol succinate (TOPROL XL) 25 MG 24 hr tablet Take 1 tablet (25 mg total) by mouth daily. 90 tablet 3  . nitroGLYCERIN (NITROSTAT) 0.4 MG SL tablet Place 0.4 mg under the tongue every 5 (five) minutes as needed for chest pain.     . sacubitril-valsartan (ENTRESTO) 97-103 MG Take 1 tablet by mouth 2 (two) times daily. 60 tablet 6  . spironolactone (ALDACTONE) 25 MG tablet Take 1 tablet (25 mg total) by mouth daily. 90 tablet 3  . Tamsulosin HCl (FLOMAX) 0.4 MG CAPS Take 0.8 mg by mouth at bedtime.      No current facility-administered medications for this visit.    Family History  Problem Relation Age of Onset  . Heart disease Father   . Colon cancer Neg Hx   . Stomach cancer Neg Hx     Social History   Socioeconomic History  . Marital status: Married    Spouse name: Not on file  . Number of children: Not on file  . Years of education: Not on file  . Highest education level: Not on file  Occupational History  . Not on file  Tobacco Use  . Smoking status: Never Smoker  . Smokeless tobacco: Never Used  Vaping Use  . Vaping Use: Never used  Substance and Sexual Activity  . Alcohol use: Yes    Alcohol/week: 6.0 standard drinks    Types: 6 Cans of beer per week    Comment: weekly  . Drug use: No  . Sexual activity: Not on file  Other Topics Concern  . Not on file  Social History Narrative  . Not on file   Social Determinants of Health   Financial Resource Strain: Not on file  Food Insecurity: Not on file  Transportation Needs: Not on file  Physical Activity: Not on file  Stress: Not on file  Social  Connections: Not on file  Intimate Partner Violence: Not on file     REVIEW OF SYSTEMS:   [X]  denotes positive finding, [ ]  denotes negative finding Cardiac  Comments:  Chest pain or chest pressure:    Shortness of breath upon exertion:    Short of breath when lying flat:  Irregular heart rhythm:        Vascular    Pain in calf, thigh, or hip brought on by ambulation:    Pain in feet at night that wakes you up from your sleep:     Blood clot in your veins:    Leg swelling:  x       Pulmonary    Oxygen at home:    Productive cough:     Wheezing:         Neurologic    Sudden weakness in arms or legs:     Sudden numbness in arms or legs:     Sudden onset of difficulty speaking or slurred speech:    Temporary loss of vision in one eye:     Problems with dizziness:         Gastrointestinal    Blood in stool:     Vomited blood:         Genitourinary    Burning when urinating:     Blood in urine:        Psychiatric    Major depression:         Hematologic    Bleeding problems:    Problems with blood clotting too easily:        Skin    Rashes or ulcers:        Constitutional    Fever or chills:      PHYSICAL EXAMINATION:  Today's Vitals   10/08/20 0959 10/08/20 1002  BP: (!) 173/62 (!) 175/71  Pulse: (!) 54   Resp: 20   Temp: 98.3 F (36.8 C)   TempSrc: Temporal   SpO2: 96%   Weight: 197 lb (89.4 kg)   Height: 5\' 7"  (1.702 m)    Body mass index is 30.85 kg/m.   General:  WDWN in NAD; vital signs documented above Gait: Not observed HENT: WNL, normocephalic Pulmonary: normal non-labored breathing Cardiac: regularly irregular HR, without carotid bruits Abdomen: soft, NT, no masses; aortic pulse is not palpable Skin: without rashes Vascular Exam/Pulses:  Right Left  Radial 2+ (normal) 2+ (normal)  Ulnar Unable to palpate Unable to palpate  Popliteal Unable to palpate Unable to palpate   Extremities: without ischemic changes, without Gangrene  , without cellulitis; without open wounds Musculoskeletal: no muscle wasting or atrophy  Neurologic: A&O X 3 Psychiatric:  The pt has Normal affect.   Non-Invasive Vascular Imaging:   Carotid Duplex on 10/08/2020: Right:  60-79% ICA stenosis Left:  60-79% ICA stenosis Hemodynamically significant plaque >50%  visualized in the CCA. The ECA appears >50% stenosed.   Vertebrals: Bilateral vertebral arteries demonstrate antegrade flow.  Subclavians: Normal flow hemodynamics were seen in bilateral subclavian  arteries.   Previous Carotid duplex on 04/08/2020: Right: 60-79% ICA stenosis Left:   60-79% ICA stenosis Vertebrals: Bilateral vertebral arteries demonstrate antegrade flow.   ASSESSMENT/PLAN:: 80 y.o. [name] here for follow up carotid artery stenosis.  Carotid artery stenosis -duplex today reveals that bilateral ICA stenosis remains at 60-79%, however, the CCA has an elevated velocity of 455cm/s.  The pt remains asymptomatic.  I did discuss these results with Dr. 62.  We will get a CTA of the head/neck in the next couple of weeks and have pt return to see Dr. Edilia Bo. -discussed s/s of stroke with pt and he understands should he develop any of these sx, he will go to the nearest ER.  Family history of AAA -pt states that his mother had an abdominal  aortic aneurysm.  Given this has a familial aspect, we will get a Medicare screen on the day he returns to see Dr. Myra Gianotti to evaluate his abdominal aorta.  Discussed with pt should he develop sudden onset of sharp abdominal or back pain, he should call 911.   -pt will return in a couple of weeks after his CTA of head and neck to see Dr. Myra Gianotti.  He will get Medicare AAA screen that day and get results of both.  -continue statin/asa   -pt will call sooner should they have any issues.   Doreatha Massed, [facility] [phone]  Clinic MD:  Edilia Bo

## 2020-10-09 ENCOUNTER — Other Ambulatory Visit: Payer: Self-pay

## 2020-10-09 DIAGNOSIS — Z8249 Family history of ischemic heart disease and other diseases of the circulatory system: Secondary | ICD-10-CM

## 2020-10-22 ENCOUNTER — Ambulatory Visit
Admission: RE | Admit: 2020-10-22 | Discharge: 2020-10-22 | Disposition: A | Discharge status: Home / Self Care | Payer: Medicare HMO | Source: Ambulatory Visit | Attending: Surgery | Admitting: Surgery

## 2020-10-22 DIAGNOSIS — I6523 Occlusion and stenosis of bilateral carotid arteries: Secondary | ICD-10-CM | POA: Diagnosis not present

## 2020-10-22 DIAGNOSIS — I672 Cerebral atherosclerosis: Secondary | ICD-10-CM | POA: Diagnosis not present

## 2020-10-22 DIAGNOSIS — I6501 Occlusion and stenosis of right vertebral artery: Secondary | ICD-10-CM | POA: Diagnosis not present

## 2020-10-22 DIAGNOSIS — I700 Atherosclerosis of aorta: Secondary | ICD-10-CM | POA: Diagnosis not present

## 2020-10-22 MED ORDER — IOPAMIDOL (ISOVUE-370) INJECTION 76%
75.0000 mL | Freq: Once | INTRAVENOUS | Status: AC | PRN
  Administered 2020-10-22: 75 mL via INTRAVENOUS

## 2020-10-27 ENCOUNTER — Other Ambulatory Visit: Payer: Self-pay

## 2020-10-27 ENCOUNTER — Ambulatory Visit (INDEPENDENT_AMBULATORY_CARE_PROVIDER_SITE_OTHER): Payer: Medicare HMO | Admitting: Surgery

## 2020-10-27 ENCOUNTER — Ambulatory Visit (HOSPITAL_COMMUNITY)
Admission: RE | Admit: 2020-10-27 | Discharge: 2020-10-27 | Disposition: A | Discharge status: Home / Self Care | Payer: Medicare HMO | Source: Ambulatory Visit | Attending: Surgery | Admitting: Surgery

## 2020-10-27 ENCOUNTER — Encounter: Payer: Self-pay | Admitting: Surgery

## 2020-10-27 VITALS — BP 159/67 | HR 59 | Temp 98.5°F | Resp 20 | Ht 67.0 in | Wt 195.0 lb

## 2020-10-27 DIAGNOSIS — E785 Hyperlipidemia, unspecified: Secondary | ICD-10-CM | POA: Insufficient documentation

## 2020-10-27 DIAGNOSIS — I6523 Occlusion and stenosis of bilateral carotid arteries: Secondary | ICD-10-CM

## 2020-10-27 DIAGNOSIS — I10 Essential (primary) hypertension: Secondary | ICD-10-CM | POA: Insufficient documentation

## 2020-10-27 DIAGNOSIS — Z8249 Family history of ischemic heart disease and other diseases of the circulatory system: Secondary | ICD-10-CM | POA: Diagnosis not present

## 2020-10-27 DIAGNOSIS — Z136 Encounter for screening for cardiovascular disorders: Secondary | ICD-10-CM | POA: Diagnosis present

## 2020-10-27 NOTE — Progress Notes (Signed)
[facility] [place]  Patient name: [name] MRN: [id number] DOB: 07-17-1941 Sex: male   REASON FOR VISIT:    Follow up  HISOTRY OF PRESENT ILLNESS:    [name] is a 80 y.o. male who has been following for carotid disease.  He has remained asymptomatic.  He was most recently seen in the office 2 weeks ago.  His ultrasound showed bilateral 60 to 79% carotid stenosis however he did have elevated velocities in the common carotid artery so he was sent for CT scan for further evaluation.  He also has a family history of abdominal aortic aneurysm and so screening was recommended.  Patient with history of coronary artery disease, status post CABG as well as PCI.  He is medically managed for hypertension.  He is on a statin for hypercholesterolemia.  He is also treated for congestive heart failure.   PAST MEDICAL HISTORY:   Past Medical History:  Diagnosis Date  . Arthritis    hips  . CAD (coronary artery disease)    s/p multiple PCIs to RCA dating back to 64. Last stent to proximal RCA in November of 2011. Also had 99% stenosis in the distal PDA with left to right collaterals and nonobstructive disease in the left coronary tree. ;  ISR of RCA 10/12 - CABG 10/9 with Dr. Zenaida Niece Trigt: L-LAD, S-OM, S-PDA  . 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 (stable)  . GERD (gastroesophageal reflux disease)   . HTN (hypertension)   . Hyperlipidemia      FAMILY HISTORY:   Family History  Problem Relation Age of Onset  . Heart disease Father   . Colon cancer Neg Hx   . Stomach cancer Neg Hx     SOCIAL HISTORY:   Social History   Tobacco Use  . Smoking status: Never Smoker  . Smokeless tobacco: Never Used  Substance Use Topics  . Alcohol use: Yes    Alcohol/week: 6.0 standard drinks    Types: 6 Cans of beer per week    Comment: weekly     ALLERGIES:   Allergies  Allergen Reactions  .  Morphine Nausea Only     CURRENT MEDICATIONS:   Current Outpatient Medications  Medication Sig Dispense Refill  . amLODipine (NORVASC) 10 MG tablet TAKE 1 TABLET ONCE DAILY. (Patient taking differently: Take 10 mg by mouth daily.) 90 tablet 3  . aspirin 81 MG chewable tablet Chew 81 mg by mouth daily.    Marland Kitchen atorvastatin (LIPITOR) 80 MG tablet TAKE ONE TABLET BY MOUTH DAILY (Patient taking differently: Take 80 mg by mouth daily.) 90 tablet 3  . dapagliflozin propanediol (FARXIGA) 10 MG TABS tablet Take 1 tablet (10 mg total) by mouth daily before breakfast. 30 tablet 11  . ezetimibe (ZETIA) 10 MG tablet TAKE 1 TABLET BY MOUTH DAILY. (Patient taking differently: Take 10 mg by mouth daily.) 90 tablet 3  . metoprolol succinate (TOPROL XL) 25 MG 24 hr tablet Take 1 tablet (25 mg total) by mouth daily. 90 tablet 3  . nitroGLYCERIN (NITROSTAT) 0.4 MG SL tablet Place 0.4 mg under the tongue every 5 (five) minutes as needed for chest pain.     . sacubitril-valsartan (ENTRESTO) 97-103 MG Take 1 tablet by mouth 2 (two) times daily. 60 tablet 6  . spironolactone (ALDACTONE) 25 MG tablet Take 1 tablet (25 mg total) by mouth daily. 90 tablet 3  . Tamsulosin HCl (FLOMAX) 0.4 MG  CAPS Take 0.8 mg by mouth at bedtime.      No current facility-administered medications for this visit.    REVIEW OF SYSTEMS:   [X] denotes positive finding, [ ] denotes negative finding Cardiac  Comments:  Chest pain or chest pressure:    Shortness of breath upon exertion:    Short of breath when lying flat:    Irregular heart rhythm:        Vascular    Pain in calf, thigh, or hip brought on by ambulation:    Pain in feet at night that wakes you up from your sleep:     Blood clot in your veins:    Leg swelling:         Pulmonary    Oxygen at home:    Productive cough:     Wheezing:         Neurologic    Sudden weakness in arms or legs:     Sudden numbness in arms or legs:     Sudden onset of difficulty speaking  or slurred speech:    Temporary loss of vision in one eye:     Problems with dizziness:         Gastrointestinal    Blood in stool:     Vomited blood:         Genitourinary    Burning when urinating:     Blood in urine:        Psychiatric    Major depression:         Hematologic    Bleeding problems:    Problems with blood clotting too easily:        Skin    Rashes or ulcers:        Constitutional    Fever or chills:      PHYSICAL EXAM:   Vitals:   10/27/20 0844 10/27/20 0846  BP: (!) 157/78 (!) 159/67  Pulse: (!) 59   Resp: 20   Temp: 98.5 F (36.9 C)   SpO2: 96%   Weight: 195 lb (88.5 kg)   Height: 5' 7" (1.702 m)     GENERAL: The patient is a well-nourished male, in no acute distress. The vital signs are documented above. CARDIAC: There is a regular rate and rhythm.  VASCULAR: I could not palpate pedal pulses.  He has mild edema PULMONARY: Non-labored respirations ABDOMEN: Soft and non-tender with normal pitched bowel sounds.  MUSCULOSKELETAL: There are no major deformities or cyanosis. NEUROLOGIC: No focal weakness or paresthesias are detected. SKIN: There are no ulcers or rashes noted. PSYCHIATRIC: The patient has a normal affect.  STUDIES:   I have reviewed his CT scan with the following findings: 1. Heavily calcified plaque in the right carotid bifurcation resulting in approximately 75% stenosis at the origin of the right internal carotid artery. 2. Calcified plaques in the left carotid bifurcation resulting in greater than 90% stenosis. 3. Approximately 70% stenosis of the right vertebral artery at C4-5 level related to extrinsic facet osteophyte compression. 4. No acute intracranial abnormality.  Abdominal ultrasound: Abdominal Aorta: No evidence of an abdominal aortic aneurysm was  visualized. The largest aortic measurement is 1.9 cm.   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  I will not schedule follow-up.  Carotid stenosis: CT   scan shows a 90% left carotid stenosis.  He is asymptomatic.  Discussed proceeding with carotid endarterectomy.  I discussed the details of the procedure including the risks and benefits not limited to stroke, nerve injury, cardiac complications.  He will get cardiology clearance from Dr. Gala Romney.  He will call me back for a surgery.    Charlena Cross, MD, FACS [facility] [phone] Pager [phone]

## 2020-10-27 NOTE — H&P (View-Only) (Signed)
[facility] [place]  Patient name: [name] MRN: [id number] DOB: 07-17-1941 Sex: male   REASON FOR VISIT:    Follow up  HISOTRY OF PRESENT ILLNESS:    [name] is a 80 y.o. male who has been following for carotid disease.  He has remained asymptomatic.  He was most recently seen in the office 2 weeks ago.  His ultrasound showed bilateral 60 to 79% carotid stenosis however he did have elevated velocities in the common carotid artery so he was sent for CT scan for further evaluation.  He also has a family history of abdominal aortic aneurysm and so screening was recommended.  Patient with history of coronary artery disease, status post CABG as well as PCI.  He is medically managed for hypertension.  He is on a statin for hypercholesterolemia.  He is also treated for congestive heart failure.   PAST MEDICAL HISTORY:   Past Medical History:  Diagnosis Date  . Arthritis    hips  . CAD (coronary artery disease)    s/p multiple PCIs to RCA dating back to 64. Last stent to proximal RCA in November of 2011. Also had 99% stenosis in the distal PDA with left to right collaterals and nonobstructive disease in the left coronary tree. ;  ISR of RCA 10/12 - CABG 10/9 with Dr. Zenaida Niece Trigt: L-LAD, S-OM, S-PDA  . 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 (stable)  . GERD (gastroesophageal reflux disease)   . HTN (hypertension)   . Hyperlipidemia      FAMILY HISTORY:   Family History  Problem Relation Age of Onset  . Heart disease Father   . Colon cancer Neg Hx   . Stomach cancer Neg Hx     SOCIAL HISTORY:   Social History   Tobacco Use  . Smoking status: Never Smoker  . Smokeless tobacco: Never Used  Substance Use Topics  . Alcohol use: Yes    Alcohol/week: 6.0 standard drinks    Types: 6 Cans of beer per week    Comment: weekly     ALLERGIES:   Allergies  Allergen Reactions  .  Morphine Nausea Only     CURRENT MEDICATIONS:   Current Outpatient Medications  Medication Sig Dispense Refill  . amLODipine (NORVASC) 10 MG tablet TAKE 1 TABLET ONCE DAILY. (Patient taking differently: Take 10 mg by mouth daily.) 90 tablet 3  . aspirin 81 MG chewable tablet Chew 81 mg by mouth daily.    Marland Kitchen atorvastatin (LIPITOR) 80 MG tablet TAKE ONE TABLET BY MOUTH DAILY (Patient taking differently: Take 80 mg by mouth daily.) 90 tablet 3  . dapagliflozin propanediol (FARXIGA) 10 MG TABS tablet Take 1 tablet (10 mg total) by mouth daily before breakfast. 30 tablet 11  . ezetimibe (ZETIA) 10 MG tablet TAKE 1 TABLET BY MOUTH DAILY. (Patient taking differently: Take 10 mg by mouth daily.) 90 tablet 3  . metoprolol succinate (TOPROL XL) 25 MG 24 hr tablet Take 1 tablet (25 mg total) by mouth daily. 90 tablet 3  . nitroGLYCERIN (NITROSTAT) 0.4 MG SL tablet Place 0.4 mg under the tongue every 5 (five) minutes as needed for chest pain.     . sacubitril-valsartan (ENTRESTO) 97-103 MG Take 1 tablet by mouth 2 (two) times daily. 60 tablet 6  . spironolactone (ALDACTONE) 25 MG tablet Take 1 tablet (25 mg total) by mouth daily. 90 tablet 3  . Tamsulosin HCl (FLOMAX) 0.4 MG  CAPS Take 0.8 mg by mouth at bedtime.      No current facility-administered medications for this visit.    REVIEW OF SYSTEMS:   [X]  denotes positive finding, [ ]  denotes negative finding Cardiac  Comments:  Chest pain or chest pressure:    Shortness of breath upon exertion:    Short of breath when lying flat:    Irregular heart rhythm:        Vascular    Pain in calf, thigh, or hip brought on by ambulation:    Pain in feet at night that wakes you up from your sleep:     Blood clot in your veins:    Leg swelling:         Pulmonary    Oxygen at home:    Productive cough:     Wheezing:         Neurologic    Sudden weakness in arms or legs:     Sudden numbness in arms or legs:     Sudden onset of difficulty speaking  or slurred speech:    Temporary loss of vision in one eye:     Problems with dizziness:         Gastrointestinal    Blood in stool:     Vomited blood:         Genitourinary    Burning when urinating:     Blood in urine:        Psychiatric    Major depression:         Hematologic    Bleeding problems:    Problems with blood clotting too easily:        Skin    Rashes or ulcers:        Constitutional    Fever or chills:      PHYSICAL EXAM:   Vitals:   10/27/20 0844 10/27/20 0846  BP: (!) 157/78 (!) 159/67  Pulse: (!) 59   Resp: 20   Temp: 98.5 F (36.9 C)   SpO2: 96%   Weight: 195 lb (88.5 kg)   Height: 5\' 7"  (1.702 m)     GENERAL: The patient is a well-nourished male, in no acute distress. The vital signs are documented above. CARDIAC: There is a regular rate and rhythm.  VASCULAR: I could not palpate pedal pulses.  He has mild edema PULMONARY: Non-labored respirations ABDOMEN: Soft and non-tender with normal pitched bowel sounds.  MUSCULOSKELETAL: There are no major deformities or cyanosis. NEUROLOGIC: No focal weakness or paresthesias are detected. SKIN: There are no ulcers or rashes noted. PSYCHIATRIC: The patient has a normal affect.  STUDIES:   I have reviewed his CT scan with the following findings: 1. Heavily calcified plaque in the right carotid bifurcation resulting in approximately 75% stenosis at the origin of the right internal carotid artery. 2. Calcified plaques in the left carotid bifurcation resulting in greater than 90% stenosis. 3. Approximately 70% stenosis of the right vertebral artery at C4-5 level related to extrinsic facet osteophyte compression. 4. No acute intracranial abnormality.  Abdominal ultrasound: Abdominal Aorta: No evidence of an abdominal aortic aneurysm was  visualized. The largest aortic measurement is 1.9 cm.   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  I will not schedule follow-up.  Carotid stenosis: CT  scan shows a 90% left carotid stenosis.  He is asymptomatic.  Discussed proceeding with carotid endarterectomy.  I discussed the details of the procedure including the risks and benefits not limited to stroke, nerve injury, cardiac complications.  He will get cardiology clearance from Dr. Gala Romney.  He will call me back for a surgery.    Charlena Cross, MD, FACS [facility] [phone] Pager [phone]

## 2020-10-30 NOTE — Progress Notes (Signed)
Advanced Heart Failure Clinic Note   Date:  10/31/2020   ID:  [name], DOB [birth year], MRN [id number]  Location: Home  Provider location: [place] [facility] Type of Visit: Established patient  PCP:  Tisovec, Adelfa Koh, MD  Cardiologist:  No primary care provider on file. Primary HF: B[place]  Chief Complaint: Heart Failure follow-up   History of Present Illness: [name] is a 80 y.o. male with history of CAD, multiple angioplasties to the RCA dating back to 1983 followed by CABG in 2012, HTN, and hyperlipidemia.  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  He was hospitalized in 11/11 because of recurrent chest pain. He underwent drug-eluting stent to the proximal RCA July 24, 2010. Ejection fraction was 65%. He also had 99% stenosis in the distal PDA with left to right collaterals and nonobstructive disease in the left coronary tree.  Admitted in October 2012 with [place]. Found to have progressive CAD. Underwent Coronary artery bypass grafting x4 (left internal mammary artery to LAD, saphenous vein graft to circumflex marginal, saphenous vein graft to posterior descending branch of the distal right coronary, saphenous vein graft to the posterolateral branch of the distal right coronary artery).   Had recurrent CP in 8/20 underwent cath which showed stable anatomy no targets for PCI  1. 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 LAD 100% 3. RCA 100% 4. LCX 50% in mid LCX 5. LIMA to LAD widely patent 6. SVG to R PDA widely patent 7. SVG to OM occluded ostially 8. SVG to PL occluded ostially Has moderate disease in anomalous LCX but not crittcal and anatomy precludes PCI. 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 He was discharged 04/23/20. On 04/25/20 he had AMS was sent to [facility] ED. HS Trop 111-->1421 -> 17,390. It was unclear if this was demand ischemia. Had LHC on 04/28/20  with no targets for revascularization and medical management. ECHO showed EF 40-45%.  Today he returns for cardiac clearance for Left CEA with Dr. Cory Roughen. Last seen 10/21 and was still struggling with pain from his hernia repair and hip replacement. Overall feeling fine today, physical activity limited by plantar fasciitis and hernia pain. He was able to golf yesterday,18 holes. Denies increasing SOB, CP, dizziness, edema, or PND/Orthopnea. Appetite ok. No fever or chills. Weight at home ~185-192 pounds. Taking all medications. BP at home ~140/65.  Carotid u/s with [facility] in 5/18: 5/18 RICA 40-59% and LICA 1-39%  Past Medical History:  Diagnosis Date  . Arthritis    hips  . CAD (coronary artery disease)    s/p multiple PCIs to RCA dating back to 41. Last stent to proximal RCA in November of 2011. Also had 99% stenosis in the distal PDA with left to right collaterals and nonobstructive disease in the left coronary tree. ;  ISR of RCA 10/12 - CABG 10/9 with Dr. Zenaida Niece Trigt: L-LAD, S-OM, S-PDA  . 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 (stable)  . GERD (gastroesophageal reflux disease)   . HTN (hypertension)   . Hyperlipidemia    Past Surgical History:  Procedure Laterality Date  . CARDIAC CATHETERIZATION    . Coronary artery bypass grafting x4 (left internal mammary artery  06/23/2011  . CORONARY STENT PLACEMENT  Nov 2011   prox RCA. Has had prior multiple procedures.  Rosalie Doctor ANGIOGRAPHY  N/A 05/04/2019   Procedure: CORONARY/GRAFT ANGIOGRAPHY;  Surgeon: Dolores Patty, MD;  Location: [facility] INVASIVE CV LAB;  Service: Cardiovascular;  Laterality: N/A;  . INGUINAL HERNIA REPAIR Right 06/10/2020   Procedure: RIGHT INGUINAL HERNIA;  Surgeon: Harriette Bouillon, MD;  Location: [facility] OR;  Service: General;  Laterality: Right;  GENERAL AND TAP BLOCK  . INSERTION OF MESH Right 06/10/2020   Procedure: INSERTION OF MESH;  Surgeon: Harriette Bouillon, MD;  Location: [facility] OR;  Service:  General;  Laterality: Right;  . JOINT REPLACEMENT    . LEFT HEART CATH AND CORS/GRAFTS ANGIOGRAPHY N/A 04/28/2020   Procedure: LEFT HEART CATH AND CORS/GRAFTS ANGIOGRAPHY;  Surgeon: Runell Gess, MD;  Location: [facility] INVASIVE CV LAB;  Service: Cardiovascular;  Laterality: N/A;  . left total hip replacement  2008  . TONSILLECTOMY    . TOOTH EXTRACTION  06/2013  . TOTAL HIP ARTHROPLASTY Right 04/22/2020   Procedure: TOTAL HIP ARTHROPLASTY;  Surgeon: Teryl Lucy, MD;  Location: [facility] ORS;  Service: Orthopedics;  Laterality: Right;     Current Outpatient Medications  Medication Sig Dispense Refill  . amLODipine (NORVASC) 10 MG tablet TAKE 1 TABLET ONCE DAILY. 90 tablet 3  . aspirin 81 MG chewable tablet Chew 81 mg by mouth daily.    Marland Kitchen atorvastatin (LIPITOR) 80 MG tablet TAKE ONE TABLET BY MOUTH DAILY 90 tablet 3  . dapagliflozin propanediol (FARXIGA) 10 MG TABS tablet Take 1 tablet (10 mg total) by mouth daily before breakfast. 30 tablet 11  . ezetimibe (ZETIA) 10 MG tablet TAKE 1 TABLET BY MOUTH DAILY. 90 tablet 3  . metoprolol succinate (TOPROL XL) 25 MG 24 hr tablet Take 1 tablet (25 mg total) by mouth daily. 90 tablet 3  . nitroGLYCERIN (NITROSTAT) 0.4 MG SL tablet Place 0.4 mg under the tongue every 5 (five) minutes as needed for chest pain.     . sacubitril-valsartan (ENTRESTO) 97-103 MG Take 1 tablet by mouth 2 (two) times daily. 60 tablet 6  . spironolactone (ALDACTONE) 25 MG tablet Take 1 tablet (25 mg total) by mouth daily. 90 tablet 3  . Tamsulosin HCl (FLOMAX) 0.4 MG CAPS Take 0.8 mg by mouth at bedtime.      No current facility-administered medications for this encounter.    Allergies:   Morphine   Social History:  The patient  reports that he has never smoked. He has never used smokeless tobacco. He reports current alcohol use of about 6.0 standard drinks of alcohol per week. He reports that he does not use drugs.   Family History:  The patient's family history includes  Heart disease in his father.   ROS:  Please see the history of present illness.   All other systems are personally reviewed and negative.   Vitals:   10/31/20 1030  BP: (!) 150/62  Pulse: (!) 57  SpO2: 98%   Exam:   General:  NAD. No resp difficulty HEENT: Normal Neck: Supple. No JVD. Carotids 2+ bilat; + left carotid bruit. No lymphadenopathy or thryomegaly appreciated. Cor: PMI nondisplaced. Regular rate & rhythm. No rubs, gallops or murmurs. Lungs: Clear Abdomen: Soft, nontender, nondistended. No hepatosplenomegaly. No bruits or masses. Good bowel sounds. Extremities: No cyanosis, clubbing, rash, trace LE edema, R>L Neuro: Alert & oriented x 3, cranial nerves grossly intact. Moves all 4 extremities w/o difficulty. Affect pleasant.  EKG: Sinus Brady 59 bpm, unchanged from previous tracaing (Personally reviewed).  Recent Labs: 04/25/2020: B Natriuretic Peptide 208.4 06/05/2020: ALT 17; Hemoglobin 11.9; Platelets  276 10/31/2020: BUN 18; Creatinine, Ser 1.06; Potassium 4.4; Sodium 139  Personally reviewed   Wt Readings from Last 3 Encounters:  10/31/20 86.2 kg (190 lb)  10/27/20 88.5 kg (195 lb)  10/08/20 89.4 kg (197 lb)    ASSESSMENT AND PLAN:  1. CAD S/p CABG 2012 - Cath 04/2020 with stable revascularization. - No s/s angina. - Continue statin and ASA.  2. 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  - NYHA I-II. Functional capacity limited by plantar fasciitis and hernia repair pain. Euvolemic on exam today. - Cotninue Entresto 97/103 mg bid. - Continue spiro 25 mg daily. - Continue Toprol XL 25 mg daily. HR precludes further titration today. - Continue Farxiga 10 mg daily. - Continue spiro 25 mg daily. - He does not need daily loop diuretic. - Stopped wearing compression hose, encouraged to continue prn swelling. - BMET today.  3. HTN - Elevated today, but BP at home ~140/65. - Check BP at home and log.  4. Carotid stenosis - Stable  40-59% disease bilateral carotids. CCA with >50% hemodynamically significant plaque.  - CT scan (2/22) shows a 90% left carotid stenosis.  - Remains asymptomatic. Follows with Dr. Myra Gianotti. - Planning for Left CEA soon.  5. HLD - Continue atorvastatin 80 mg daily + Zetia. - LDL 66 in 8/21. Followed by Dr. Wylene Simmer.  His heart failure is well-controlled, EF 40-45%, he has no chest pain with or without activity, and stable revascularization on [facility] 8/21. He is low cardiac risk for surgery.  Follow up in 4-5 months with Dr. Mickle Plumb, FNP -[facility] 10/31/2020 12:51 PM  Advanced Heart Failure Clinic [facility] [address] [facility] [place] [address] [phone] (office) [phone] (fax)

## 2020-10-31 ENCOUNTER — Encounter (HOSPITAL_COMMUNITY): Payer: Self-pay

## 2020-10-31 ENCOUNTER — Other Ambulatory Visit: Payer: Self-pay

## 2020-10-31 ENCOUNTER — Ambulatory Visit (HOSPITAL_COMMUNITY)
Admission: RE | Admit: 2020-10-31 | Discharge: 2020-10-31 | Disposition: A | Discharge status: Home / Self Care | Payer: Medicare HMO | Source: Ambulatory Visit | Attending: Family Medicine | Admitting: Family Medicine

## 2020-10-31 VITALS — BP 150/62 | HR 57 | Wt 190.0 lb

## 2020-10-31 DIAGNOSIS — Z7984 Long term (current) use of oral hypoglycemic drugs: Secondary | ICD-10-CM | POA: Insufficient documentation

## 2020-10-31 DIAGNOSIS — I255 Ischemic cardiomyopathy: Secondary | ICD-10-CM | POA: Insufficient documentation

## 2020-10-31 DIAGNOSIS — Z8249 Family history of ischemic heart disease and other diseases of the circulatory system: Secondary | ICD-10-CM | POA: Insufficient documentation

## 2020-10-31 DIAGNOSIS — I2510 Atherosclerotic heart disease of native coronary artery without angina pectoris: Secondary | ICD-10-CM | POA: Diagnosis not present

## 2020-10-31 DIAGNOSIS — Z885 Allergy status to narcotic agent status: Secondary | ICD-10-CM | POA: Insufficient documentation

## 2020-10-31 DIAGNOSIS — I6523 Occlusion and stenosis of bilateral carotid arteries: Secondary | ICD-10-CM

## 2020-10-31 DIAGNOSIS — M722 Plantar fascial fibromatosis: Secondary | ICD-10-CM | POA: Insufficient documentation

## 2020-10-31 DIAGNOSIS — Z951 Presence of aortocoronary bypass graft: Secondary | ICD-10-CM | POA: Diagnosis not present

## 2020-10-31 DIAGNOSIS — I5020 Unspecified systolic (congestive) heart failure: Secondary | ICD-10-CM | POA: Diagnosis not present

## 2020-10-31 DIAGNOSIS — E785 Hyperlipidemia, unspecified: Secondary | ICD-10-CM | POA: Diagnosis not present

## 2020-10-31 DIAGNOSIS — I110 Hypertensive heart disease with heart failure: Secondary | ICD-10-CM | POA: Diagnosis not present

## 2020-10-31 DIAGNOSIS — Z955 Presence of coronary angioplasty implant and graft: Secondary | ICD-10-CM | POA: Insufficient documentation

## 2020-10-31 DIAGNOSIS — Z79899 Other long term (current) drug therapy: Secondary | ICD-10-CM | POA: Insufficient documentation

## 2020-10-31 DIAGNOSIS — E7849 Other hyperlipidemia: Secondary | ICD-10-CM | POA: Diagnosis not present

## 2020-10-31 DIAGNOSIS — I5022 Chronic systolic (congestive) heart failure: Secondary | ICD-10-CM | POA: Diagnosis not present

## 2020-10-31 DIAGNOSIS — Z7982 Long term (current) use of aspirin: Secondary | ICD-10-CM | POA: Insufficient documentation

## 2020-10-31 DIAGNOSIS — I10 Essential (primary) hypertension: Secondary | ICD-10-CM

## 2020-10-31 LAB — BASIC METABOLIC PANEL
Anion gap: 9 (ref 5–15)
BUN: 18 mg/dL (ref 8–23)
CO2: 23 mmol/L (ref 22–32)
Calcium: 9.7 mg/dL (ref 8.9–10.3)
Chloride: 107 mmol/L (ref 98–111)
Creatinine, Ser: 1.06 mg/dL (ref 0.61–1.24)
GFR, Estimated: >60 mL/min (ref >60)
Glucose, Bld: 113 mg/dL — ABNORMAL HIGH (ref 70–99)
Potassium: 4.4 mmol/L (ref 3.5–5.1)
Sodium: 139 mmol/L (ref 135–145)

## 2020-10-31 NOTE — Patient Instructions (Signed)
Labs done today, your results will be available in MyChart, we will contact you for abnormal readings.  Your physician recommends that you schedule a follow-up appointment in: 5 months  If you have any questions or concerns before your next appointment please send [place] a message through [place] or call our office at [phone].    TO LEAVE A MESSAGE FOR THE NURSE SELECT OPTION 2, PLEASE LEAVE A MESSAGE INCLUDING: . YOUR NAME . DATE OF BIRTH . CALL BACK NUMBER . REASON FOR CALL**this is important as we prioritize the call backs  YOU WILL RECEIVE A CALL BACK THE SAME DAY AS LONG AS YOU CALL BEFORE 4:00 PM

## 2020-11-03 ENCOUNTER — Other Ambulatory Visit: Payer: Self-pay

## 2020-11-05 ENCOUNTER — Other Ambulatory Visit (HOSPITAL_COMMUNITY): Payer: Self-pay | Admitting: Internal Medicine

## 2020-11-12 DIAGNOSIS — M545 Low back pain, unspecified: Secondary | ICD-10-CM | POA: Diagnosis not present

## 2020-11-14 ENCOUNTER — Other Ambulatory Visit (HOSPITAL_COMMUNITY): Payer: Self-pay | Admitting: Internal Medicine

## 2020-11-17 ENCOUNTER — Telehealth: Payer: Self-pay

## 2020-11-17 NOTE — Telephone Encounter (Signed)
Pt had vomiting and diarrhea last Wednesday for a couple days. Has been feeling better last few days and is eating and drinking without issue. He just feels like he is "dragging". He wanted to let us know, as he is scheduled for surgery Friday. I have advised him to call us back if n/v or diarrhea returns, otherwise if he is continuing to feel better each day he can keep his scheduled pre op appt Thursday. Pt was in agreement and has no further questions/concerns at this time.

## 2020-11-20 ENCOUNTER — Other Ambulatory Visit: Payer: Self-pay

## 2020-11-20 ENCOUNTER — Encounter (HOSPITAL_COMMUNITY)
Admission: RE | Admit: 2020-11-20 | Discharge: 2020-11-20 | Disposition: A | Discharge status: Home / Self Care | Payer: Medicare HMO | Source: Ambulatory Visit | Attending: Surgery | Admitting: Surgery

## 2020-11-20 ENCOUNTER — Other Ambulatory Visit (HOSPITAL_COMMUNITY): Payer: Medicare HMO

## 2020-11-20 ENCOUNTER — Other Ambulatory Visit (HOSPITAL_COMMUNITY)
Admission: RE | Admit: 2020-11-20 | Discharge: 2020-11-20 | Disposition: A | Discharge status: Home / Self Care | Payer: Medicare HMO | Source: Ambulatory Visit | Attending: Surgery | Admitting: Surgery

## 2020-11-20 ENCOUNTER — Encounter (HOSPITAL_COMMUNITY): Payer: Self-pay

## 2020-11-20 DIAGNOSIS — Z01812 Encounter for preprocedural laboratory examination: Secondary | ICD-10-CM | POA: Insufficient documentation

## 2020-11-20 DIAGNOSIS — Z20822 Contact with and (suspected) exposure to covid-19: Secondary | ICD-10-CM | POA: Insufficient documentation

## 2020-11-20 LAB — SARS CORONAVIRUS 2 (TAT 6-24 HRS): SARS Coronavirus 2: NEGATIVE

## 2020-11-20 LAB — COMPREHENSIVE METABOLIC PANEL
ALT: 28 U/L (ref 0–44)
AST: 27 U/L (ref 15–41)
Albumin: 3.7 g/dL (ref 3.5–5.0)
Alkaline Phosphatase: 47 U/L (ref 38–126)
Anion gap: 5 (ref 5–15)
BUN: 13 mg/dL (ref 8–23)
CO2: 26 mmol/L (ref 22–32)
Calcium: 9.2 mg/dL (ref 8.9–10.3)
Chloride: 107 mmol/L (ref 98–111)
Creatinine, Ser: 0.91 mg/dL (ref 0.61–1.24)
GFR, Estimated: >60 mL/min (ref >60)
Glucose, Bld: 112 mg/dL — ABNORMAL HIGH (ref 70–99)
Potassium: 4.3 mmol/L (ref 3.5–5.1)
Sodium: 138 mmol/L (ref 135–145)
Total Bilirubin: 1.5 mg/dL — ABNORMAL HIGH (ref 0.3–1.2)
Total Protein: 6.6 g/dL (ref 6.5–8.1)

## 2020-11-20 LAB — CBC
HCT: 41.5 % (ref 39.0–52.0)
Hemoglobin: 14.2 g/dL (ref 13.0–17.0)
MCH: 32.6 pg (ref 26.0–34.0)
MCHC: 34.2 g/dL (ref 30.0–36.0)
MCV: 95.4 fL (ref 80.0–100.0)
Platelets: 225 10*3/uL (ref 150–400)
RBC: 4.35 MIL/uL (ref 4.22–5.81)
RDW: 12.6 % (ref 11.5–15.5)
WBC: 6.5 10*3/uL (ref 4.0–10.5)
nRBC: 0 % (ref 0.0–0.2)

## 2020-11-20 LAB — URINALYSIS, ROUTINE W REFLEX MICROSCOPIC
Bacteria, UA: NONE SEEN
Bilirubin Urine: NEGATIVE
Glucose, UA: >=500 mg/dL — AB
Hgb urine dipstick: NEGATIVE
Ketones, ur: NEGATIVE mg/dL
Leukocytes,Ua: NEGATIVE
Nitrite: NEGATIVE
Protein, ur: NEGATIVE mg/dL
Specific Gravity, Urine: 1.008 (ref 1.005–1.030)
pH: 6 (ref 5.0–8.0)

## 2020-11-20 LAB — TYPE AND SCREEN
ABO/RH(D): A POS
Antibody Screen: NEGATIVE

## 2020-11-20 LAB — SURGICAL PCR SCREEN
MRSA, PCR: NEGATIVE
Staphylococcus aureus: NEGATIVE

## 2020-11-20 LAB — APTT: aPTT: 43 seconds — ABNORMAL HIGH (ref 24–36)

## 2020-11-20 LAB — PROTIME-INR
INR: 1 (ref 0.8–1.2)
Prothrombin Time: 12.9 seconds (ref 11.4–15.2)

## 2020-11-20 NOTE — Progress Notes (Addendum)
PCP - Dr. Wylene Simmer Cardiologist - Dr. Gala Romney  Chest x-ray - 05/13/20 (1 view) EKG - 10/31/20 Stress Test - 04/27/17 ECHO - 04/26/20 Cardiac Cath - 04/28/20  Aspirin Instructions:  Continues Aspirin  COVID TEST- scheduled for today   Anesthesia review: yes, heart hx  Patient denies shortness of breath, fever, cough and chest pain at PAT appointment   All instructions explained to the patient, with a verbal understanding of the material. Patient agrees to go over the instructions while at home for a better understanding. Patient also instructed to self quarantine after being tested for COVID-19. The opportunity to ask questions was provided.

## 2020-11-20 NOTE — Anesthesia Preprocedure Evaluation (Addendum)
Anesthesia Evaluation  Patient identified by MRN, date of birth, ID band Patient awake    Reviewed: Allergy & Precautions, NPO status , Patient's Chart, lab work & pertinent test results  Airway Mallampati: II  TM Distance: >3 FB Neck ROM: Full    Dental  (+) Teeth Intact   Pulmonary neg pulmonary ROS,    Pulmonary exam normal        Cardiovascular hypertension, Pt. on medications and Pt. on home beta blockers + CAD, + Past MI, + Cardiac Stents, + CABG (2012), + Peripheral Vascular Disease (bilateral carotid stenosis) and +CHF   Rhythm:Regular Rate:Normal     Neuro/Psych negative neurological ROS  negative psych ROS   GI/Hepatic Neg liver ROS, GERD  Medicated,  Endo/Other  negative endocrine ROS  Renal/GU negative Renal ROS  negative genitourinary   Musculoskeletal  (+) Arthritis ,   Abdominal (+)  Abdomen: soft. Bowel sounds: normal.  Peds  Hematology negative hematology ROS (+)   Anesthesia Other Findings   Reproductive/Obstetrics                           Anesthesia Physical Anesthesia Plan  ASA: III  Anesthesia Plan: General   Post-op Pain Management:    Induction: Intravenous  PONV Risk Score and Plan: 2 and Ondansetron, Dexamethasone and Treatment may vary due to age or medical condition  Airway Management Planned: Mask and Oral ETT  Additional Equipment: Arterial line  Intra-op Plan:   Post-operative Plan: Extubation in OR  Informed Consent: I have reviewed the patients History and Physical, chart, labs and discussed the procedure including the risks, benefits and alternatives for the proposed anesthesia with the patient or authorized representative who has indicated his/her understanding and acceptance.     Dental advisory given  Plan Discussed with: CRNA  Anesthesia Plan Comments: (Lab Results      Component                Value               Date                       WBC                      6.5                 11/20/2020                HGB                      14.2                11/20/2020                HCT                      41.5                11/20/2020                MCV                      95.4                11/20/2020                PLT  225                 11/20/2020           Lab Results      Component                Value               Date                      NA                       138                 11/20/2020                K                        4.3                 11/20/2020                CO2                      26                  11/20/2020                GLUCOSE                  112 (H)             11/20/2020                BUN                      13                  11/20/2020                CREATININE               0.91                11/20/2020                CALCIUM                  9.2                 11/20/2020                GFRNONAA                 >60                 11/20/2020                GFRAA                    >60                 06/05/2020           ECHO 08/21: IMPRESSIONS  1. Left ventricular ejection fraction, by estimation, is 40 to 45%. The  left ventricle has mildly decreased function. The left ventricle  demonstrates regional wall motion abnormalities (see scoring  diagram/findings for description). There is mild  concentric  left ventricular hypertrophy. Left ventricular diastolic  parameters were normal.  2. Right ventricular systolic function is normal. The right ventricular  size is normal. There is normal pulmonary artery systolic pressure. The  estimated right ventricular systolic pressure is 32.8 mmHg.  3. The mitral valve is grossly normal. No evidence of mitral valve  regurgitation. No evidence of mitral stenosis.  4. The aortic valve is tricuspid. Aortic valve regurgitation is not  visualized. Mild to moderate aortic valve sclerosis/calcification is   present, without any evidence of aortic stenosis.  5. The inferior vena cava is normal in size with greater than 50%  respiratory variability, suggesting right atrial pressure of 3 mmHg.  PAT note by Antionette Poles, PA-C: Follows with cardiology for history of systolic HF due to ischemic cardiomyopathy, CAD, multiple angioplasties to the RCA dating back to 1983 followed by CABG in 2012, HTN, and hyperlipidemia.  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 Last seen by Prince Rome, FNP 10/31/20. Per note, recent cath 04/2020 showing stable revascularization with no targets for intervention, ECHO showed EF 40-45%. Commented on operative risk, "His heart failure is well-controlled, EF 40-45%, he has no chest pain with or without activity, and stable revascularization on [facility] 8/21. He is low cardiac risk for surgery."  Preop labs reviewed, PTT mildly elevated at 43, otherwise unremarkable. Result called to Dr. Estanislado Spire office.   EKG 10/31/2020: Sinus bradycardia.  Rate 59.  PORTABLE CHEST 1 VIEW 04/25/20: COMPARISON:  07/19/2011   FINDINGS: Post sternotomy changes. No focal opacity or pleural effusion. Aortic atherosclerosis. No pneumothorax.   IMPRESSION: No active disease.   CTA head/neck 10/22/20: IMPRESSION: 1. Heavily calcified plaque in the right carotid bifurcation resulting in approximately 75% stenosis at the origin of the right internal carotid artery. 2. Calcified plaques in the left carotid bifurcation resulting in greater than 90% stenosis. 3. Approximately 70% stenosis of the right vertebral artery at C4-5 level related to extrinsic facet osteophyte compression. 4. No acute intracranial abnormality.  LHC 04/28/20: IMPRESSION: Mr. Gover has unchanged anatomy from the diagnostic cath performed by Dr. Gala Romney 1 year ago.  The vein to his PDA and LIMA to his LAD were patent.  His native anatomy otherwise is unchanged with an occluded  dominant RCA.  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  The circumflex was patent as was the LAD.  There is no culprit lesion identified.  A left femoral angiogram was performed and a Mynx closure device was successfully deployed achieving hemostasis.  The patient left lab in stable condition. )     Anesthesia Quick Evaluation

## 2020-11-20 NOTE — Pre-Procedure Instructions (Addendum)
[name]  11/20/2020    Your procedure is scheduled on Thursday, November 21, 2020 at 7:30 AM.   Report to [facility] Entrance "A" Admitting Office at 5:30 AM.   Call this number if you have problems the morning of surgery: [phone]   Remember:  Do not eat or drink after midnight tonight.  Take these medicines the morning of surgery with A SIP OF WATER: Atorvastatin (Lipitor), Pantoprazole (Protonix),  Do not take Farxiga in the AM. Do not use NSAIDS (Ibuprofen, Aleve, etc) prior to surgery.     Do not wear jewelry.  Do not wear lotions, powders, colgone or deodorant.  Men may shave face and neck.  Do not bring valuables to the hospital.  [facility] is not responsible for any belongings or valuables.  Contacts, dentures or bridgework may not be worn into surgery.  Leave your suitcase in the car.  After surgery it may be brought to your room.  For patients admitted to the hospital, discharge time will be determined by your treatment team.  [facility] - Preparing for Surgery  Before surgery, you can play an important role.  Because skin is not sterile, your skin needs to be as free of germs as possible.  You can reduce the number of germs on you skin by washing with CHG (chlorahexidine gluconate) soap before surgery.  CHG is an antiseptic cleaner which kills germs and bonds with the skin to continue killing germs even after washing.  Oral Hygiene is also important in reducing the risk of infection.  Remember to brush your teeth with your regular toothpaste the morning of surgery.  Please DO NOT use if you have an allergy to CHG or antibacterial soaps.  If your skin becomes reddened/irritated stop using the CHG and inform your nurse when you arrive at Short Stay.  Do not shave (including legs and underarms) for at least 48 hours prior to the first CHG shower.  You may shave your face.  Please follow these instructions carefully:   1.  Shower with CHG Soap the  night before surgery and the morning of Surgery.  2.  If you choose to wash your hair, wash your hair first as usual with your normal shampoo.  3.  After you shampoo, rinse your hair and body thoroughly to remove the shampoo. 4.  Use CHG as you would any other liquid soap.  You can apply chg directly to the skin and wash gently with a      scrungie or washcloth.           5.  Apply the CHG Soap to your body ONLY FROM THE NECK DOWN.   Do not use on open wounds or open sores. Avoid contact with your eyes, ears, mouth and genitals (private parts).  Wash genitals (private parts) with your normal soap, do this prior to using CHG soap.  6.  Wash thoroughly, paying special attention to the area where your surgery will be performed.  7.  Thoroughly rinse your body with warm water from the neck down.  8.  DO NOT shower/wash with your normal soap after using and rinsing off the CHG Soap.  9.  Pat yourself dry with a clean towel.            10.  Wear clean pajamas.            11.  Place clean sheets on your bed the night of your first shower and do  not sleep with pets.  Day of Surgery  Shower as above. Do not apply any lotions/deodorants the morning of surgery.   Please wear clean clothes to the hospital. Remember to brush your teeth with toothpaste.  Please read over the fact sheets that you were given.

## 2020-11-20 NOTE — Progress Notes (Addendum)
Anesthesia Chart Review:  Follows with cardiology for history of systolic HF due to ischemic cardiomyopathy, CAD, multiple angioplasties to the RCA dating back to 1983 followed by CABG in 2012, HTN, and hyperlipidemia.  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 Last seen by Prince Rome, FNP 10/31/20. Per note, recent cath 04/2020 showing stable revascularization with no targets for intervention, ECHO showed EF 40-45%. Commented on operative risk, "His heart failure is well-controlled, EF 40-45%, he has no chest pain with or without activity, and stable revascularization on [facility] 8/21. He is low cardiac risk for surgery."  Preop labs reviewed, PTT mildly elevated at 43, otherwise unremarkable. Result called to Dr. Estanislado Spire office.   EKG 10/31/2020: Sinus bradycardia.  Rate 59.  PORTABLE CHEST 1 VIEW 04/25/20: COMPARISON:  07/19/2011   FINDINGS: Post sternotomy changes. No focal opacity or pleural effusion. Aortic atherosclerosis. No pneumothorax.   IMPRESSION: No active disease.   CTA head/neck 10/22/20: IMPRESSION: 1. Heavily calcified plaque in the right carotid bifurcation resulting in approximately 75% stenosis at the origin of the right internal carotid artery. 2. Calcified plaques in the left carotid bifurcation resulting in greater than 90% stenosis. 3. Approximately 70% stenosis of the right vertebral artery at C4-5 level related to extrinsic facet osteophyte compression. 4. No acute intracranial abnormality.  LHC 04/28/20: IMPRESSION: Mr. Nickles has unchanged anatomy from the diagnostic cath performed by Dr. Gala Romney 1 year ago.  The vein to his PDA and LIMA to his LAD were patent.  His native anatomy otherwise is unchanged with an occluded dominant RCA.  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  The circumflex was patent as was the LAD.  There is no culprit lesion identified.  A left  femoral angiogram was performed and a Mynx closure device was successfully deployed achieving hemostasis.  The patient left lab in stable condition.   Zannie Cove [facility] Short Stay Center/Anesthesiology Phone [phone] 11/20/2020 1:55 PM

## 2020-11-21 ENCOUNTER — Encounter (HOSPITAL_COMMUNITY): Payer: Self-pay | Admitting: Surgery

## 2020-11-21 ENCOUNTER — Encounter (HOSPITAL_COMMUNITY)
Admission: RE | Disposition: A | Discharge status: Home / Self Care | Payer: Self-pay | Source: Ambulatory Visit | Attending: Surgery

## 2020-11-21 ENCOUNTER — Inpatient Hospital Stay (HOSPITAL_COMMUNITY)
Admission: RE | Admit: 2020-11-21 | Discharge: 2020-11-22 | DRG: 038 | Disposition: A | Discharge status: Home / Self Care | Payer: Medicare HMO | Source: Ambulatory Visit | Attending: Surgery | Admitting: Surgery

## 2020-11-21 ENCOUNTER — Inpatient Hospital Stay (HOSPITAL_COMMUNITY): Payer: Medicare HMO | Admitting: Physician Assistant

## 2020-11-21 ENCOUNTER — Other Ambulatory Visit: Payer: Self-pay

## 2020-11-21 DIAGNOSIS — Z20822 Contact with and (suspected) exposure to covid-19: Secondary | ICD-10-CM | POA: Diagnosis not present

## 2020-11-21 DIAGNOSIS — I509 Heart failure, unspecified: Secondary | ICD-10-CM | POA: Diagnosis not present

## 2020-11-21 DIAGNOSIS — I255 Ischemic cardiomyopathy: Secondary | ICD-10-CM | POA: Diagnosis present

## 2020-11-21 DIAGNOSIS — I110 Hypertensive heart disease with heart failure: Secondary | ICD-10-CM | POA: Diagnosis present

## 2020-11-21 DIAGNOSIS — Z885 Allergy status to narcotic agent status: Secondary | ICD-10-CM

## 2020-11-21 DIAGNOSIS — K219 Gastro-esophageal reflux disease without esophagitis: Secondary | ICD-10-CM | POA: Diagnosis not present

## 2020-11-21 DIAGNOSIS — N400 Enlarged prostate without lower urinary tract symptoms: Secondary | ICD-10-CM | POA: Diagnosis not present

## 2020-11-21 DIAGNOSIS — I2510 Atherosclerotic heart disease of native coronary artery without angina pectoris: Secondary | ICD-10-CM | POA: Diagnosis present

## 2020-11-21 DIAGNOSIS — I6523 Occlusion and stenosis of bilateral carotid arteries: Principal | ICD-10-CM | POA: Diagnosis present

## 2020-11-21 DIAGNOSIS — I5022 Chronic systolic (congestive) heart failure: Secondary | ICD-10-CM | POA: Diagnosis present

## 2020-11-21 DIAGNOSIS — I252 Old myocardial infarction: Secondary | ICD-10-CM

## 2020-11-21 DIAGNOSIS — E785 Hyperlipidemia, unspecified: Secondary | ICD-10-CM | POA: Diagnosis not present

## 2020-11-21 DIAGNOSIS — Z951 Presence of aortocoronary bypass graft: Secondary | ICD-10-CM | POA: Diagnosis not present

## 2020-11-21 DIAGNOSIS — I6522 Occlusion and stenosis of left carotid artery: Secondary | ICD-10-CM | POA: Diagnosis not present

## 2020-11-21 DIAGNOSIS — Z96649 Presence of unspecified artificial hip joint: Secondary | ICD-10-CM | POA: Diagnosis present

## 2020-11-21 HISTORY — PX: PATCH ANGIOPLASTY: SHX6230

## 2020-11-21 HISTORY — PX: ENDARTERECTOMY: SHX5162

## 2020-11-21 LAB — POCT ACTIVATED CLOTTING TIME: Activated Clotting Time: 225 seconds

## 2020-11-21 SURGERY — ENDARTERECTOMY, CAROTID
Anesthesia: General | Site: Neck | Laterality: Left

## 2020-11-21 MED ORDER — HEMOSTATIC AGENTS (NO CHARGE) OPTIME
TOPICAL | Status: DC | PRN
  Administered 2020-11-21 (×2): 1 via TOPICAL

## 2020-11-21 MED ORDER — ROCURONIUM BROMIDE 10 MG/ML (PF) SYRINGE
PREFILLED_SYRINGE | INTRAVENOUS | Status: AC
  Filled 2020-11-21: qty 10

## 2020-11-21 MED ORDER — SODIUM CHLORIDE 0.9 % IV SOLN: INTRAVENOUS | Status: DC

## 2020-11-21 MED ORDER — PROTAMINE SULFATE 10 MG/ML IV SOLN
INTRAVENOUS | Status: AC
  Filled 2020-11-21: qty 5

## 2020-11-21 MED ORDER — PROPOFOL 10 MG/ML IV BOLUS
INTRAVENOUS | Status: DC | PRN
  Administered 2020-11-21: 30 mg via INTRAVENOUS
  Administered 2020-11-21 (×2): 50 mg via INTRAVENOUS

## 2020-11-21 MED ORDER — CEFAZOLIN SODIUM-DEXTROSE 2-4 GM/100ML-% IV SOLN
INTRAVENOUS | Status: AC
  Filled 2020-11-21: qty 100

## 2020-11-21 MED ORDER — SUGAMMADEX SODIUM 200 MG/2ML IV SOLN
INTRAVENOUS | Status: DC | PRN
  Administered 2020-11-21: 200 mg via INTRAVENOUS

## 2020-11-21 MED ORDER — GLYCOPYRROLATE PF 0.2 MG/ML IJ SOSY
PREFILLED_SYRINGE | INTRAMUSCULAR | Status: DC | PRN
  Administered 2020-11-21 (×2): .1 mg via INTRAVENOUS

## 2020-11-21 MED ORDER — ALUM & MAG HYDROXIDE-SIMETH 200-200-20 MG/5ML PO SUSP: 15.0000 mL | ORAL | Status: DC | PRN

## 2020-11-21 MED ORDER — AMLODIPINE BESYLATE 10 MG PO TABS
10.0000 mg | ORAL_TABLET | Freq: Every evening | ORAL | Status: DC
  Administered 2020-11-21: 10 mg via ORAL
  Filled 2020-11-21: qty 1

## 2020-11-21 MED ORDER — SODIUM CHLORIDE 0.9 % IV SOLN: 500.0000 mL | Freq: Once | INTRAVENOUS | Status: DC | PRN

## 2020-11-21 MED ORDER — ASPIRIN 81 MG PO CHEW
81.0000 mg | CHEWABLE_TABLET | Freq: Every evening | ORAL | Status: DC
  Administered 2020-11-21: 81 mg via ORAL
  Filled 2020-11-21: qty 1

## 2020-11-21 MED ORDER — LIDOCAINE 2% (20 MG/ML) 5 ML SYRINGE
INTRAMUSCULAR | Status: AC
  Filled 2020-11-21: qty 5

## 2020-11-21 MED ORDER — LIDOCAINE HCL (PF) 1 % IJ SOLN
INTRAMUSCULAR | Status: AC
  Filled 2020-11-21: qty 30

## 2020-11-21 MED ORDER — PHENOL 1.4 % MT LIQD: 1.0000 | OROMUCOSAL | Status: DC | PRN

## 2020-11-21 MED ORDER — ATORVASTATIN CALCIUM 80 MG PO TABS
80.0000 mg | ORAL_TABLET | Freq: Every day | ORAL | Status: DC
  Administered 2020-11-22: 80 mg via ORAL
  Filled 2020-11-21: qty 1

## 2020-11-21 MED ORDER — SACUBITRIL-VALSARTAN 97-103 MG PO TABS
1.0000 | ORAL_TABLET | Freq: Two times a day (BID) | ORAL | Status: DC
  Administered 2020-11-21 – 2020-11-22 (×2): 1 via ORAL
  Filled 2020-11-21 (×3): qty 1

## 2020-11-21 MED ORDER — ONDANSETRON HCL 4 MG/2ML IJ SOLN: 4.0000 mg | Freq: Four times a day (QID) | INTRAMUSCULAR | Status: DC | PRN

## 2020-11-21 MED ORDER — ACETAMINOPHEN 325 MG PO TABS
325.0000 mg | ORAL_TABLET | ORAL | Status: DC | PRN
  Administered 2020-11-21 (×2): 650 mg via ORAL
  Filled 2020-11-21 (×2): qty 2

## 2020-11-21 MED ORDER — MAGNESIUM SULFATE 2 GM/50ML IV SOLN: 2.0000 g | Freq: Every day | INTRAVENOUS | Status: DC | PRN

## 2020-11-21 MED ORDER — 0.9 % SODIUM CHLORIDE (POUR BTL) OPTIME
TOPICAL | Status: DC | PRN
  Administered 2020-11-21: 2000 mL

## 2020-11-21 MED ORDER — ACETAMINOPHEN 10 MG/ML IV SOLN: 1000.0000 mg | Freq: Once | INTRAVENOUS | Status: DC | PRN

## 2020-11-21 MED ORDER — CEFAZOLIN SODIUM-DEXTROSE 2-4 GM/100ML-% IV SOLN
2.0000 g | Freq: Three times a day (TID) | INTRAVENOUS | Status: AC
  Administered 2020-11-21 (×2): 2 g via INTRAVENOUS
  Filled 2020-11-21 (×2): qty 100

## 2020-11-21 MED ORDER — ONDANSETRON HCL 4 MG/2ML IJ SOLN
INTRAMUSCULAR | Status: AC
  Filled 2020-11-21: qty 2

## 2020-11-21 MED ORDER — LIDOCAINE 2% (20 MG/ML) 5 ML SYRINGE
INTRAMUSCULAR | Status: DC | PRN
  Administered 2020-11-21: 80 mg via INTRAVENOUS

## 2020-11-21 MED ORDER — SPIRONOLACTONE 25 MG PO TABS
25.0000 mg | ORAL_TABLET | Freq: Every day | ORAL | Status: DC
Start: 2020-11-21 — End: 2020-11-22
  Administered 2020-11-21 – 2020-11-22 (×2): 25 mg via ORAL
  Filled 2020-11-21 (×2): qty 1

## 2020-11-21 MED ORDER — ROCURONIUM BROMIDE 10 MG/ML (PF) SYRINGE
PREFILLED_SYRINGE | INTRAVENOUS | Status: DC | PRN
  Administered 2020-11-21: 60 mg via INTRAVENOUS

## 2020-11-21 MED ORDER — HYDROMORPHONE HCL 1 MG/ML IJ SOLN: 0.5000 mg | INTRAMUSCULAR | Status: DC | PRN

## 2020-11-21 MED ORDER — DAPAGLIFLOZIN PROPANEDIOL 10 MG PO TABS
10.0000 mg | ORAL_TABLET | Freq: Every day | ORAL | Status: DC
  Administered 2020-11-22: 10 mg via ORAL
  Filled 2020-11-21: qty 1

## 2020-11-21 MED ORDER — POLYETHYLENE GLYCOL 3350 17 G PO PACK: 17.0000 g | PACK | Freq: Every day | ORAL | Status: DC | PRN

## 2020-11-21 MED ORDER — PANTOPRAZOLE SODIUM 40 MG PO TBEC
40.0000 mg | DELAYED_RELEASE_TABLET | Freq: Every day | ORAL | Status: DC
  Administered 2020-11-22: 40 mg via ORAL
  Filled 2020-11-21: qty 1

## 2020-11-21 MED ORDER — POTASSIUM CHLORIDE CRYS ER 20 MEQ PO TBCR: 20.0000 meq | EXTENDED_RELEASE_TABLET | Freq: Every day | ORAL | Status: DC | PRN

## 2020-11-21 MED ORDER — CHLORHEXIDINE GLUCONATE 0.12 % MT SOLN
OROMUCOSAL | Status: AC
  Administered 2020-11-21: 15 mL via OROMUCOSAL
  Filled 2020-11-21: qty 15

## 2020-11-21 MED ORDER — HEPARIN SODIUM (PORCINE) 1000 UNIT/ML IJ SOLN
INTRAMUSCULAR | Status: DC | PRN
  Administered 2020-11-21: 8000 [IU] via INTRAVENOUS
  Administered 2020-11-21: 1000 [IU] via INTRAVENOUS

## 2020-11-21 MED ORDER — ACETAMINOPHEN 650 MG RE SUPP: 325.0000 mg | RECTAL | Status: DC | PRN

## 2020-11-21 MED ORDER — FENTANYL CITRATE (PF) 250 MCG/5ML IJ SOLN
INTRAMUSCULAR | Status: DC | PRN
  Administered 2020-11-21: 150 ug via INTRAVENOUS

## 2020-11-21 MED ORDER — FENTANYL CITRATE (PF) 250 MCG/5ML IJ SOLN
INTRAMUSCULAR | Status: AC
  Filled 2020-11-21: qty 5

## 2020-11-21 MED ORDER — FENTANYL CITRATE (PF) 100 MCG/2ML IJ SOLN
25.0000 ug | INTRAMUSCULAR | Status: DC | PRN
Start: 2020-11-21 — End: 2020-11-21

## 2020-11-21 MED ORDER — DEXAMETHASONE SODIUM PHOSPHATE 10 MG/ML IJ SOLN
INTRAMUSCULAR | Status: AC
  Filled 2020-11-21: qty 1

## 2020-11-21 MED ORDER — HYDRALAZINE HCL 20 MG/ML IJ SOLN
5.0000 mg | INTRAMUSCULAR | Status: DC | PRN
Start: 2020-11-21 — End: 2020-11-22

## 2020-11-21 MED ORDER — OXYCODONE-ACETAMINOPHEN 5-325 MG PO TABS: 1.0000 | ORAL_TABLET | ORAL | Status: DC | PRN

## 2020-11-21 MED ORDER — PROPOFOL 10 MG/ML IV BOLUS
INTRAVENOUS | Status: AC
  Filled 2020-11-21: qty 20

## 2020-11-21 MED ORDER — PHENYLEPHRINE 40 MCG/ML (10ML) SYRINGE FOR IV PUSH (FOR BLOOD PRESSURE SUPPORT)
PREFILLED_SYRINGE | INTRAVENOUS | Status: AC
  Filled 2020-11-21: qty 10

## 2020-11-21 MED ORDER — GLYCOPYRROLATE PF 0.2 MG/ML IJ SOSY
PREFILLED_SYRINGE | INTRAMUSCULAR | Status: AC
  Filled 2020-11-21: qty 1

## 2020-11-21 MED ORDER — PROMETHAZINE HCL 25 MG/ML IJ SOLN
6.2500 mg | INTRAMUSCULAR | Status: DC | PRN
Start: 2020-11-21 — End: 2020-11-21

## 2020-11-21 MED ORDER — DOCUSATE SODIUM 100 MG PO CAPS
100.0000 mg | ORAL_CAPSULE | Freq: Every day | ORAL | Status: DC
  Filled 2020-11-21: qty 1

## 2020-11-21 MED ORDER — METOPROLOL SUCCINATE ER 25 MG PO TB24
25.0000 mg | ORAL_TABLET | Freq: Every evening | ORAL | Status: DC
  Filled 2020-11-21: qty 1

## 2020-11-21 MED ORDER — CHLORHEXIDINE GLUCONATE CLOTH 2 % EX PADS: 6.0000 | MEDICATED_PAD | Freq: Once | CUTANEOUS | Status: DC

## 2020-11-21 MED ORDER — TAMSULOSIN HCL 0.4 MG PO CAPS
0.8000 mg | ORAL_CAPSULE | Freq: Every day | ORAL | Status: DC
  Administered 2020-11-21: 0.8 mg via ORAL
  Filled 2020-11-21: qty 2

## 2020-11-21 MED ORDER — CEFAZOLIN SODIUM-DEXTROSE 2-4 GM/100ML-% IV SOLN
2.0000 g | INTRAVENOUS | Status: AC
  Administered 2020-11-21: 2 g via INTRAVENOUS

## 2020-11-21 MED ORDER — SODIUM CHLORIDE 0.9 % IV SOLN
0.0500 ug/kg/min | INTRAVENOUS | Status: AC
  Administered 2020-11-21: .2 ug/kg/min via INTRAVENOUS
  Filled 2020-11-21: qty 5000

## 2020-11-21 MED ORDER — ONDANSETRON HCL 4 MG/2ML IJ SOLN
INTRAMUSCULAR | Status: DC | PRN
  Administered 2020-11-21: 4 mg via INTRAVENOUS

## 2020-11-21 MED ORDER — SODIUM CHLORIDE 0.9 % IV SOLN
INTRAVENOUS | Status: AC
  Filled 2020-11-21: qty 1.2

## 2020-11-21 MED ORDER — HEPARIN SODIUM (PORCINE) 1000 UNIT/ML IJ SOLN
INTRAMUSCULAR | Status: AC
  Filled 2020-11-21: qty 1

## 2020-11-21 MED ORDER — PHENYLEPHRINE HCL-NACL 10-0.9 MG/250ML-% IV SOLN
INTRAVENOUS | Status: DC | PRN
  Administered 2020-11-21: 20 ug/min via INTRAVENOUS

## 2020-11-21 MED ORDER — PROTAMINE SULFATE 10 MG/ML IV SOLN
INTRAVENOUS | Status: DC | PRN
  Administered 2020-11-21: 10 mg via INTRAVENOUS
  Administered 2020-11-21: 40 mg via INTRAVENOUS

## 2020-11-21 MED ORDER — GUAIFENESIN-DM 100-10 MG/5ML PO SYRP: 15.0000 mL | ORAL_SOLUTION | ORAL | Status: DC | PRN

## 2020-11-21 MED ORDER — ORAL CARE MOUTH RINSE: 15.0000 mL | Freq: Once | OROMUCOSAL | Status: AC

## 2020-11-21 MED ORDER — BISACODYL 5 MG PO TBEC: 5.0000 mg | DELAYED_RELEASE_TABLET | Freq: Every day | ORAL | Status: DC | PRN

## 2020-11-21 MED ORDER — EZETIMIBE 10 MG PO TABS
10.0000 mg | ORAL_TABLET | Freq: Every day | ORAL | Status: DC
  Administered 2020-11-21 – 2020-11-22 (×2): 10 mg via ORAL
  Filled 2020-11-21 (×2): qty 1

## 2020-11-21 MED ORDER — CHLORHEXIDINE GLUCONATE 0.12 % MT SOLN: 15.0000 mL | Freq: Once | OROMUCOSAL | Status: AC

## 2020-11-21 MED ORDER — LABETALOL HCL 5 MG/ML IV SOLN
10.0000 mg | INTRAVENOUS | Status: DC | PRN
Start: 2020-11-21 — End: 2020-11-22

## 2020-11-21 MED ORDER — EPHEDRINE SULFATE 50 MG/ML IJ SOLN
INTRAMUSCULAR | Status: DC | PRN
  Administered 2020-11-21 (×2): 10 mg via INTRAVENOUS

## 2020-11-21 MED ORDER — METOPROLOL TARTRATE 5 MG/5ML IV SOLN: 2.0000 mg | INTRAVENOUS | Status: DC | PRN

## 2020-11-21 MED ORDER — SODIUM CHLORIDE 0.9 % IV SOLN
INTRAVENOUS | Status: DC | PRN
  Administered 2020-11-21: 500 mL

## 2020-11-21 MED ORDER — LACTATED RINGERS IV SOLN: INTRAVENOUS | Status: DC | PRN

## 2020-11-21 MED ORDER — DEXAMETHASONE SODIUM PHOSPHATE 10 MG/ML IJ SOLN
INTRAMUSCULAR | Status: DC | PRN
  Administered 2020-11-21: 10 mg via INTRAVENOUS

## 2020-11-21 MED ORDER — LACTATED RINGERS IV SOLN: INTRAVENOUS | Status: DC

## 2020-11-21 SURGICAL SUPPLY — 56 items
ADH SKN CLS APL DERMABOND .7 (GAUZE/BANDAGES/DRESSINGS) ×1
AGENT HMST KT MTR STRL THRMB (HEMOSTASIS) ×1
CANISTER SUCT 3000ML PPV (MISCELLANEOUS) ×2 IMPLANT
CATH ROBINSON RED A/P 18FR (CATHETERS) ×2 IMPLANT
CATH SUCT 10FR WHISTLE TIP (CATHETERS) ×2 IMPLANT
CLIP VESOCCLUDE MED 6/CT (CLIP) ×2 IMPLANT
CLIP VESOCCLUDE SM WIDE 6/CT (CLIP) ×2 IMPLANT
COVER PROBE W GEL 5X96 (DRAPES) ×1 IMPLANT
COVER WAND RF STERILE (DRAPES) ×1 IMPLANT
DERMABOND ADVANCED (GAUZE/BANDAGES/DRESSINGS) ×1
DERMABOND ADVANCED .7 DNX12 (GAUZE/BANDAGES/DRESSINGS) ×1 IMPLANT
DRAIN CHANNEL 15F RND FF W/TCR (WOUND CARE) IMPLANT
ELECT CAUTERY BLADE 6.4 (BLADE) ×1 IMPLANT
ELECT REM PT RETURN 9FT ADLT (ELECTROSURGICAL) ×2
ELECTRODE REM PT RTRN 9FT ADLT (ELECTROSURGICAL) ×1 IMPLANT
EVACUATOR SILICONE 100CC (DRAIN) IMPLANT
GLOVE BIOGEL PI IND STRL 7.5 (GLOVE) ×1 IMPLANT
GLOVE BIOGEL PI INDICATOR 7.5 (GLOVE) ×1
GLOVE SS BIOGEL STRL SZ 6.5 (GLOVE) IMPLANT
GLOVE SUPERSENSE BIOGEL SZ 6.5 (GLOVE) ×1
GLOVE SURG SS PI 7.5 STRL IVOR (GLOVE) ×2 IMPLANT
GOWN STRL REUS W/ TWL LRG LVL3 (GOWN DISPOSABLE) ×2 IMPLANT
GOWN STRL REUS W/ TWL XL LVL3 (GOWN DISPOSABLE) ×1 IMPLANT
GOWN STRL REUS W/TWL LRG LVL3 (GOWN DISPOSABLE) ×4
GOWN STRL REUS W/TWL XL LVL3 (GOWN DISPOSABLE) ×2
HEMOSTAT SNOW SURGICEL 2X4 (HEMOSTASIS) ×1 IMPLANT
INSERT FOGARTY SM (MISCELLANEOUS) IMPLANT
KIT BASIN OR (CUSTOM PROCEDURE TRAY) ×2 IMPLANT
KIT SHUNT ARGYLE CAROTID ART 6 (VASCULAR PRODUCTS) IMPLANT
KIT TURNOVER KIT B (KITS) ×2 IMPLANT
LOOP VESSEL MAXI BLUE (MISCELLANEOUS) ×2 IMPLANT
NDL HYPO 25GX1X1/2 BEV (NEEDLE) IMPLANT
NEEDLE HYPO 25GX1X1/2 BEV (NEEDLE) IMPLANT
NS IRRIG 1000ML POUR BTL (IV SOLUTION) ×5 IMPLANT
PACK CAROTID (CUSTOM PROCEDURE TRAY) ×2 IMPLANT
PAD ARMBOARD 7.5X6 YLW CONV (MISCELLANEOUS) ×2 IMPLANT
PATCH VASC XENOSURE 1CMX6CM (Vascular Products) ×2 IMPLANT
PATCH VASC XENOSURE 1X6 (Vascular Products) IMPLANT
PENCIL BUTTON HOLSTER BLD 10FT (ELECTRODE) ×1 IMPLANT
POSITIONER HEAD DONUT 9IN (MISCELLANEOUS) ×2 IMPLANT
SET WALTER ACTIVATION W/DRAPE (SET/KITS/TRAYS/PACK) IMPLANT
SHUNT CAROTID BYPASS 10 (VASCULAR PRODUCTS) IMPLANT
SHUNT CAROTID BYPASS 12FRX15.5 (VASCULAR PRODUCTS) IMPLANT
SPONGE LAP 18X18 RF (DISPOSABLE) ×1 IMPLANT
SURGIFLO W/THROMBIN 8M KIT (HEMOSTASIS) ×1 IMPLANT
SUT ETHILON 3 0 PS 1 (SUTURE) IMPLANT
SUT PROLENE 6 0 BV (SUTURE) ×5 IMPLANT
SUT PROLENE 7 0 BV1 MDA (SUTURE) ×1 IMPLANT
SUT SILK 3 0 (SUTURE)
SUT SILK 3-0 18XBRD TIE 12 (SUTURE) IMPLANT
SUT VIC AB 3-0 SH 27 (SUTURE) ×4
SUT VIC AB 3-0 SH 27X BRD (SUTURE) ×2 IMPLANT
SUT VIC AB 3-0 X1 27 (SUTURE) ×2 IMPLANT
SYR CONTROL 10ML LL (SYRINGE) IMPLANT
TOWEL GREEN STERILE (TOWEL DISPOSABLE) ×2 IMPLANT
WATER STERILE IRR 1000ML POUR (IV SOLUTION) ×2 IMPLANT

## 2020-11-21 NOTE — Interval H&P Note (Signed)
History and Physical Interval Note:  11/21/2020 6:32 AM  [name]  has presented today for surgery, with the diagnosis of BILATERAL CAROTID STENOSIS.  The various methods of treatment have been discussed with the patient and family. After consideration of risks, benefits and other options for treatment, the patient has consented to  Procedure(s): LEFT CAROTID ENDARTERECTOMY (Left) as a surgical intervention.  The patient's history has been reviewed, patient examined, no change in status, stable for surgery.  I have reviewed the patient's chart and labs.  Questions were answered to the patient's satisfaction.     [name]

## 2020-11-21 NOTE — Progress Notes (Addendum)
  Day of Surgery Note    Subjective:  Awake and alert. C/o of frontal headache   Vitals:   11/21/20 1200 11/21/20 1230  BP: (!) 130/42 (!) 125/36  Pulse: (!) 47 (!) 45  Resp: 15 15  Temp:    SpO2: 92% 98%    Incisions:   Left neck incision well approximated without oozing or hematoma Extremities:  Moves all well. 5/5 grip, dorsiflexion and plantar flexion bilaterally Cardiac:  Decreased rate, reg rythym Lungs:  nonlabored resp Neuro: A and O. Tongue midline. Face symmetric   Assessment/Plan:  This is a 80 y.o. male who is s/p left CEA for asymptomatic left ICA stenosis. Hemodynamically stable. Neuro intact. Bradycardia. Diastolic BP running in 40s. Continue close monitoring  -To 4E   Wendi Maya, PA-C 11/21/2020 12:43 PM [phone]

## 2020-11-21 NOTE — Progress Notes (Signed)
Pt admitted to 4 East from PACU.  Pt is A&O X4 and neuro intact.  Carotid incision is clean/dry/intact with no bruising, bleeding or signs of hematoma. Vitals within normal range with exception of BP 148/51 (78) and HR 53. Pt placed on telemetry and CCMD notified.  CHG bath completed.  Pt is currently comfortable and not in pain.

## 2020-11-21 NOTE — Transfer of Care (Signed)
Immediate Anesthesia Transfer of Care Note  Patient: [name]  Procedure(s) Performed: LEFT CAROTID ENDARTERECTOMY (Left Neck) PATCH ANGIOPLASTY (Left Neck)  Patient Location: PACU  Anesthesia Type:General  Level of Consciousness: awake, alert , oriented, patient cooperative and responds to stimulation  Airway & Oxygen Therapy: Patient Spontanous Breathing and Patient connected to face mask oxygen  Post-op Assessment: Report given to RN, Post -op Vital signs reviewed and stable, Patient moving all extremities and Patient moving all extremities X 4  Post vital signs: Reviewed and stable  Last Vitals:  Vitals Value Taken Time  BP 121/70 11/21/20 1014  Temp    Pulse 65 11/21/20 1015  Resp 14 11/21/20 1015  SpO2 93 % 11/21/20 1015  Vitals shown include unvalidated device data.  Last Pain:  Vitals:   11/21/20 0600  TempSrc: Oral         Complications: No complications documented.

## 2020-11-21 NOTE — Anesthesia Procedure Notes (Signed)
Arterial Line Insertion Performed by: Drema Pry, CRNA, CRNA  Preanesthetic checklist: patient identified, IV checked, risks and benefits discussed, surgical consent, monitors and equipment checked and pre-op evaluation Lidocaine 1% used for infiltration Left, radial was placed Catheter size: 20 G Hand hygiene performed  and maximum sterile barriers used   Attempts: 2 Procedure performed without using ultrasound guided technique. Following insertion, Biopatch and dressing applied. Post procedure assessment: normal  Patient tolerated the procedure well with no immediate complications.

## 2020-11-21 NOTE — Anesthesia Procedure Notes (Addendum)
Procedure Name: Intubation Date/Time: 11/21/2020 7:48 AM Performed by: Janace Litten, CRNA Pre-anesthesia Checklist: Patient identified, Emergency Drugs available, Suction available and Patient being monitored Patient Re-evaluated:Patient Re-evaluated prior to induction Oxygen Delivery Method: Circle System Utilized and Circle system utilized Preoxygenation: Pre-oxygenation with 100% oxygen Induction Type: IV induction Ventilation: Mask ventilation without difficulty Laryngoscope Size: Mac and 3 Grade View: Grade II Tube type: Oral Tube size: 7.5 mm Number of attempts: 1 Airway Equipment and Method: Stylet and Oral airway Placement Confirmation: ETT inserted through vocal cords under direct vision,  positive ETCO2,  breath sounds checked- equal and bilateral and CO2 detector Secured at: 23 cm Tube secured with: Tape Dental Injury: Teeth and Oropharynx as per pre-operative assessment  Comments: Intubation by Molli Hazard

## 2020-11-21 NOTE — Op Note (Signed)
Patient name: [name] MRN: [id number] DOB: 1941/01/09 Sex: male  11/21/2020 Pre-operative Diagnosis: Asymptomatic   left carotid stenosis Post-operative diagnosis:  Same Surgeon:  [name] Assistants:  Wendi Maya Procedure:    left carotid Endarterectomy with bovine pericardial patch angioplasty Anesthesia:  General Blood Loss:  100 cc Specimens:  none  Findings:  90 %stenosis; Thrombus:  none  Indications:  [age] year old with progressive bilateral carotid stenosis, left greater than right who is here today for left carotid endarterectomy  Procedure:  The patient was identified in the holding area and taken to [facility] OR ROOM 12  The patient was then placed supine on the table.   General endotrachial anesthesia was administered.  The patient was prepped and draped in the usual sterile fashion.  A time out was called and antibiotics were administered.  A PA was necessary to expedite the procedure and assist with technical details   The incision was made along the anterior border of the left sternocleidomastoid muscle.  Cautery was used to dissect through the subcutaneous tissue.  The platysma muscle was divided with cautery.  The internal jugular vein was exposed along its anterior medial border.  The common facial vein was exposed and then divided between 2-0 silk ties and metal clips.  The common carotid artery was then circumferentially exposed and encircled with an umbilical tape.  The vagus nerve was identified and protected.  Next sharp dissection was used to expose the external carotid artery and the superior thyroid artery.  The were encircled with a blue vessel loop and a 2-0 silk tie respectively.  Finally, the internal carotid was carefully dissected free.  An umbilical tape was placed around the internal carotid artery distal to the diseased segment.  The hypoglossal nerve was visualized throughout and protected.  The patient was given systemic heparinization.  A bovine  carotid patch was selected and prepared on the back table.  A 10 french shunt was also prepared.  After blood pressure readings were appropriate and the heparin had been given time to circulate, the internal carotid artery was occluded with a baby Gregory clamp.  The external and common carotid arteries were then occluded with vascular clamps and the 2-0 tie tightened on the superior thyroid artery.  A #11 blade was used to make an arteriotomy in the common carotid artery.  This was extended with Potts scissors along the anterior and lateral border of the common and internal carotid artery.  Approximately 90% stenosis was identified.  There was no thrombus identified.  The 10 french shunt was not placed, as there was excellent backbleeding..  A kleiner kuntz elevator was used to perform endarterectomy.  An eversion endarterectomy was performed in the external carotid artery.  A good distal endpoint was obtained in the internal carotid artery.  The specimen was removed and sent to pathology.  Heparinized saline was used to irrigate the endarterectomized field.  All potential embolic debris was removed.  Bovine pericardial patch angioplasty was then performed using a running 6-0 Prolene.  The common internal and external carotid arteries were all appropriately flushed. The artery was again irrigated with heparin saline.  The anastomosis was then secured. The clamp was first released on the external carotid artery followed by the common carotid artery approximately 30 seconds later, bloodflow was reestablish through the internal carotid artery.  Next, a hand-held  Doppler was used to evaluate the signals in the common, external, and internal  carotid arteries, all of  which had appropriate signals. I then administered  50 mg protamine. The wound was then irrigated.  After hemostasis was achieved, the carotid sheath was reapproximated with 3-0 Vicryl. The  platysma muscle was reapproximated with running 3-0 Vicryl.  The skin  was closed with 4-0 Vicryl. Dermabond was placed on the skin. The  patient was then successfully extubated. His neurologic exam was  similar to his preprocedural exam. The patient was then taken to recovery room  in stable condition. There were no complications.     Disposition:  To PACU in stable condition.  Relevant Operative Details: PACU stable  V. [name], M.D., [facility] [place] Office: [phone] Pager:  [phone]

## 2020-11-21 NOTE — Progress Notes (Signed)
Mobility Specialist: Progress Note   11/21/20 1743  Mobility  Activity Ambulated in hall  Level of Assistance Contact guard assist, steadying assist  Assistive Device Front wheel walker  Distance Ambulated (ft) 470 ft  Mobility Response Tolerated well  Mobility performed by Mobility specialist  $Mobility charge 1 Mobility   Pre-Mobility: 51 HR, 140/64 BP, 96% SpO2 Post-Mobility: 47 HR, 138/48 BP, 93% SpO2  Pt asx during ambulation. Pt requesting pain medication for headache after returning to room, RN present. Pt back to bed after walk per request.   Cristal Deer [employer] Mobility Specialist Mobility Specialist Phone: [phone]

## 2020-11-22 LAB — CBC
HCT: 34.9 % — ABNORMAL LOW (ref 39.0–52.0)
Hemoglobin: 11.7 g/dL — ABNORMAL LOW (ref 13.0–17.0)
MCH: 32.1 pg (ref 26.0–34.0)
MCHC: 33.5 g/dL (ref 30.0–36.0)
MCV: 95.9 fL (ref 80.0–100.0)
Platelets: 208 10*3/uL (ref 150–400)
RBC: 3.64 MIL/uL — ABNORMAL LOW (ref 4.22–5.81)
RDW: 12.5 % (ref 11.5–15.5)
WBC: 12.4 10*3/uL — ABNORMAL HIGH (ref 4.0–10.5)
nRBC: 0 % (ref 0.0–0.2)

## 2020-11-22 LAB — BASIC METABOLIC PANEL
Anion gap: 7 (ref 5–15)
BUN: 16 mg/dL (ref 8–23)
CO2: 20 mmol/L — ABNORMAL LOW (ref 22–32)
Calcium: 8.2 mg/dL — ABNORMAL LOW (ref 8.9–10.3)
Chloride: 108 mmol/L (ref 98–111)
Creatinine, Ser: 0.85 mg/dL (ref 0.61–1.24)
GFR, Estimated: >60 mL/min (ref >60)
Glucose, Bld: 128 mg/dL — ABNORMAL HIGH (ref 70–99)
Potassium: 4 mmol/L (ref 3.5–5.1)
Sodium: 135 mmol/L (ref 135–145)

## 2020-11-22 MED ORDER — OXYCODONE-ACETAMINOPHEN 5-325 MG PO TABS: 1.0000 | ORAL_TABLET | ORAL | 0 refills | Status: DC | PRN

## 2020-11-22 NOTE — Progress Notes (Signed)
    Subjective  - POD #1, s/p left CEA  No complanits   Physical Exam:  Incisions c/d/i Neuro intact       Assessment/Plan:  POD #1   stable for discharge  Wells Myrtice Lowdermilk 11/22/2020 9:59 AM --  Vitals:   11/22/20 0400 11/22/20 0800  BP: (!) 164/44 124/65  Pulse: (!) 53 (!) 49  Resp: 16 15  Temp: 98.2 F (36.8 C) 98 F (36.7 C)  SpO2: 97% 96%    Intake/Output Summary (Last 24 hours) at 11/22/2020 0959 Last data filed at 11/22/2020 0522 Gross per 24 hour  Intake 1900 ml  Output 10 ml  Net 1890 ml     Laboratory CBC    Component Value Date/Time   WBC 12.4 (H) 11/22/2020 0450   HGB 11.7 (L) 11/22/2020 0450   HCT 34.9 (L) 11/22/2020 0450   PLT 208 11/22/2020 0450    BMET    Component Value Date/Time   NA 135 11/22/2020 0450   K 4.0 11/22/2020 0450   CL 108 11/22/2020 0450   CO2 20 (L) 11/22/2020 0450   GLUCOSE 128 (H) 11/22/2020 0450   BUN 16 11/22/2020 0450   CREATININE 0.85 11/22/2020 0450   CALCIUM 8.2 (L) 11/22/2020 0450   GFRNONAA >60 11/22/2020 0450   GFRAA >60 06/05/2020 0922    COAG Lab Results  Component Value Date   INR 1.0 11/20/2020   INR 1.0 04/26/2020   INR 1.0 04/25/2020   No results found for: PTT  Antibiotics Anti-infectives (From admission, onward)   Start     Dose/Rate Route Frequency Ordered Stop   11/21/20 1600  ceFAZolin (ANCEF) IVPB 2g/100 mL premix        2 g 200 mL/hr over 30 Minutes Intravenous Every 8 hours 11/21/20 1416 11/21/20 2356   11/21/20 0559  ceFAZolin (ANCEF) 2-4 GM/100ML-% IVPB       Note to Pharmacy: Tawanna Sat   : cabinet override      11/21/20 0559 11/21/20 0803   11/21/20 0553  ceFAZolin (ANCEF) IVPB 2g/100 mL premix        2 g 200 mL/hr over 30 Minutes Intravenous 30 min pre-op 11/21/20 0553 11/21/20 0801       V. Charlena Cross, M.D., [facility] [place] Office: [phone] Pager:  [phone]

## 2020-11-22 NOTE — Discharge Summary (Signed)
Discharge Summary     [name] 22-Jun-1941 80 y.o. male  [id number]  Admission Date: 11/21/2020  Discharge Date: 11/22/2020 Physician: Dr. Myra Gianotti Admission Diagnosis: Left carotid artery stenosis [I65.22]   HPI:   This is a 80 y.o. male with progressive bilateral carotid stenosis, left greater than right who is here for left carotid endarterectomy  Hospital Course:  The patient was admitted to the hospital and taken to the operating room on 11/21/2020 and underwent left carotid endarterectomy.    Findings: 90 %stenosis; Thrombus:  none  The pt tolerated the procedure well and was transported to the PACU in excellent condition.   By POD 1, the pt neuro status was intact. He was hemodynamically stable. He complained of mild frontal headache that resolved. His incision was without hematoma. He was tolerating diet and ready for discharge home.  The remainder of the hospital course consisted of increasing mobilization and increasing intake of solids without difficulty.   Recent Labs    11/20/20 1031 11/22/20 0450  NA 138 135  K 4.3 4.0  CL 107 108  CO2 26 20*  GLUCOSE 112* 128*  BUN 13 16  CALCIUM 9.2 8.2*   Recent Labs    11/20/20 1031 11/22/20 0450  WBC 6.5 12.4*  HGB 14.2 11.7*  HCT 41.5 34.9*  PLT 225 208   Recent Labs    11/20/20 1031  INR 1.0     Discharge Instructions    Discharge patient   Complete by: As directed    Discharge disposition: 01-Home or Self Care   Discharge patient date: 11/22/2020      Discharge Diagnosis:  Left carotid artery stenosis [I65.22]  Secondary Diagnosis: Patient Active Problem List   Diagnosis Date Noted  . Left carotid artery stenosis 11/21/2020  . NSTEMI (non-ST elevated myocardial infarction) (HCC) 04/25/2020  . GERD (gastroesophageal reflux disease) 04/25/2020  . BPH (benign prostatic hyperplasia) 04/25/2020  . History of total hip arthroplasty 04/22/20 04/25/2020  . Osteoarthritis of right hip  03/31/2020  . Carotid stenosis 11/06/2012  . Claudication of left lower extremity (HCC) 11/06/2012  . Hyperlipidemia 03/03/2009  . HYPERTENSION, BENIGN 03/03/2009  . CAD, NATIVE VESSEL 03/03/2009  . CAROTID BRUIT, RIGHT 03/03/2009   Past Medical History:  Diagnosis Date  . Arthritis    hips  . CAD (coronary artery disease)    s/p multiple PCIs to RCA dating back to 45. Last stent to proximal RCA in November of 2011. Also had 99% stenosis in the distal PDA with left to right collaterals and nonobstructive disease in the left coronary tree. ;  ISR of RCA 10/12 - CABG 10/9 with Dr. Zenaida Niece Trigt: L-LAD, S-OM, S-PDA  . 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 (stable)  . GERD (gastroesophageal reflux disease)   . HTN (hypertension)   . Hyperlipidemia     Allergies as of 11/22/2020      Reactions   Morphine Nausea Only      Medication List    TAKE these medications   amLODipine 10 MG tablet Commonly known as: NORVASC TAKE 1 TABLET ONCE DAILY. What changed: when to take this   aspirin 81 MG chewable tablet Chew 81 mg by mouth every evening.   atorvastatin 80 MG tablet Commonly known as: LIPITOR TAKE ONE TABLET BY MOUTH DAILY   dapagliflozin propanediol 10 MG Tabs tablet Commonly known as: Farxiga Take 1 tablet (10 mg total) by mouth daily before breakfast.   Entresto 97-103 MG  Generic drug: sacubitril-valsartan Take 1 tablet by mouth 2 (two) times daily.   ezetimibe 10 MG tablet Commonly known as: ZETIA TAKE 1 TABLET BY MOUTH DAILY.   ibuprofen 200 MG tablet Commonly known as: ADVIL Take 400 mg by mouth every 8 (eight) hours as needed (pain).   metoprolol succinate 25 MG 24 hr tablet Commonly known as: Toprol XL Take 1 tablet (25 mg total) by mouth daily. What changed: when to take this   nitroGLYCERIN 0.4 MG SL tablet Commonly known as: NITROSTAT 1 TAB UNDER TONGUE AS NEEDED FOR CHEST PAIN. MAY REPEAT EVERY 5 MIN FOR A TOTAL OF 3 DOSES. What  changed: See the new instructions.   oxyCODONE-acetaminophen 5-325 MG tablet Commonly known as: PERCOCET/ROXICET Take 1 tablet by mouth every 4 (four) hours as needed for moderate pain.   pantoprazole 40 MG tablet Commonly known as: PROTONIX TAKE 1 TABLET ONCE DAILY.   spironolactone 25 MG tablet Commonly known as: ALDACTONE Take 1 tablet (25 mg total) by mouth daily.   tamsulosin 0.4 MG Caps capsule Commonly known as: FLOMAX Take 0.8 mg by mouth at bedtime.        [facility] Discharge Instructions Carotid Endarterectomy (CEA)  Please refer to the following instructions for your post-procedure care. Your surgeon or physician assistant will discuss any changes with you.  Activity  You are encouraged to walk as much as you can. You can slowly return to normal activities but must avoid strenuous activity and heavy lifting until your doctor tell you it's OK. Avoid activities such as vacuuming or swinging a golf club. You can drive after one week if you are comfortable and you are no longer taking prescription pain medications. It is normal to feel tired for serval weeks after your surgery. It is also normal to have difficulty with sleep habits, eating, and bowel movements after surgery. These will go away with time.  Bathing/Showering  You may shower after you come home. Do not soak in a bathtub, hot tub, or swim until the incision heals completely.  Incision Care  Shower every day. Clean your incision with mild soap and water. Pat the area dry with a clean towel. You do not need a bandage unless otherwise instructed. Do not apply any ointments or creams to your incision. You may have skin glue on your incision. Do not peel it off. It will come off on its own in about one week. Your incision may feel thickened and raised for several weeks after your surgery. This is normal and the skin will soften over time. For Men Only: It's OK to shave around the  incision but do not shave the incision itself for 2 weeks. It is common to have numbness under your chin that could last for several months.  Diet  Resume your normal diet. There are no special food restrictions following this procedure. A low fat/low cholesterol diet is recommended for all patients with vascular disease. In order to heal from your surgery, it is CRITICAL to get adequate nutrition. Your body requires vitamins, minerals, and protein. Vegetables are the best source of vitamins and minerals. Vegetables also provide the perfect balance of protein. Processed food has little nutritional value, so try to avoid this.  Medications  Resume taking all of your medications unless your doctor or physician assistant tells you not to.  If your incision is causing pain, you may take over-the- counter pain relievers such as acetaminophen (Tylenol). If you were prescribed  a stronger pain medication, please be aware these medications can cause nausea and constipation.  Prevent nausea by taking the medication with a snack or meal. Avoid constipation by drinking plenty of fluids and eating foods with a high amount of fiber, such as fruits, vegetables, and grains.  Do not take Tylenol if you are taking prescription pain medications.  Follow Up  Our office will schedule a follow up appointment 2-3 weeks following discharge.  Please call us immediately for any of the following conditions  . Increased pain, redness, drainage (pus) from your incision site. . Fever of 101 degrees or higher. . If you should develop stroke (slurred speech, difficulty swallowing, weakness on one side of your body, loss of vision) you should call 911 and go to the nearest emergency room. .  Reduce your risk of vascular disease:  . Stop smoking. If you would like help call [facility] at 1-800-QUIT-NOW ((601)710-2814) or [place] at 337-142-4295. . Manage your cholesterol . Maintain a desired weight . Control your  diabetes . Keep your blood pressure down .  If you have any questions, please call the office at [phone].  Prescriptions given: 1.   Roxicet #20 No Refill  Disposition: home  Patient's condition: is Excellent  Follow up: 1. Dr. Myra Gianotti in 2 weeks.   Wendi Maya, PA-C [facility] [phone]   --- For [facility] use ---   Modified Rankin score at D/C (0-6): 0  IV medication needed for:  1. Hypertension: No 2. Hypotension: No  Post-op Complications: No  1. Post-op CVA or TIA: No  If yes: Event classification (right eye, left eye, right cortical, left cortical, verterobasilar, other):  If yes: Timing of event (intra-op, <6 hrs post-op, >=6 hrs post-op, unknown):   2. CN injury: No  If yes: CN n/a injuried   3. Myocardial infarction: No  If yes: Dx by (EKG or clinical, Troponin):  4.  CHF: No  5.  Dysrhythmia (new): No  6. Wound infection: No  7. Reperfusion symptoms: No  8. Return to OR: No  If yes: return to OR for (bleeding, neurologic, other CEA incision, other):  Discharge medications: Statin use:  Yes ASA use:  Yes   Beta blocker use:  Yes ACE-Inhibitor use:  No  ARB use:  Yes CCB use: No P2Y12 Antagonist use: No, [ ]  Plavix, [ ]  Plasugrel, [ ]  Ticlopinine, [ ]  Ticagrelor, [ ]  Other, [ ]  No for medical reason, [ ]  Non-compliant, [x Not-indicated Anti-coagulant use:  No, [ ]  Warfarin, [ ]  Rivaroxaban, [ ]  Dabigatran,

## 2020-11-22 NOTE — Progress Notes (Signed)
Orders received to discharge patient.  Telemetry monitor removed and CCMD notified.  PIV access removed.  Discharge instructions, follow up, medications and instructions for their use discussed with patient. 

## 2020-11-22 NOTE — Progress Notes (Signed)
   11/21/20 2200  Assess: MEWS Score  Temp 97.7 F (36.5 C)  BP (!) 150/46  Pulse Rate (!) 41  ECG Heart Rate (!) 41  Resp 12  SpO2 93 %  O2 Device Room Air  Assess: MEWS Score  MEWS Temp 0  MEWS Systolic 0  MEWS Pulse 1  MEWS RR 1  MEWS LOC 0  MEWS Score 2  MEWS Score Color Yellow  Assess: if the MEWS score is Yellow or Red  Were vital signs taken at a resting state? Yes  Focused Assessment No change from prior assessment  Early Detection of Sepsis Score *See Row Information* Low  MEWS guidelines implemented *See Row Information* Yes  Take Vital Signs  Increase Vital Sign Frequency  Yellow: Q 2hr X 2 then Q 4hr X 2, if remains yellow, continue Q 4hrs  Escalate  MEWS: Escalate Yellow: discuss with charge nurse/RN and consider discussing with provider and RRT  Notify: Charge Nurse/RN  Name of Charge Nurse/RN Notified Tim RN  Date Charge Nurse/RN Notified 11/21/20  Time Charge Nurse/RN Notified 2205  Document  Progress note created (see row info) Yes

## 2020-11-22 NOTE — Discharge Instructions (Signed)
   [facility] [place]  Discharge Instructions   Carotid Endarterectomy (CEA)  Please refer to the following instructions for your post-procedure care. Your surgeon or physician assistant will discuss any changes with you.  Activity  You are encouraged to walk as much as you can. You can slowly return to normal activities but must avoid strenuous activity and heavy lifting until your doctor tell you it's OK. Avoid activities such as vacuuming or swinging a golf club. You can drive after one week if you are comfortable and you are no longer taking prescription pain medications. It is normal to feel tired for serval weeks after your surgery. It is also normal to have difficulty with sleep habits, eating, and bowel movements after surgery. These will go away with time.  Bathing/Showering  You may shower after you come home. Do not soak in a bathtub, hot tub, or swim until the incision heals completely.  Incision Care  Shower every day. Clean your incision with mild soap and water. Pat the area dry with a clean towel. You do not need a bandage unless otherwise instructed. Do not apply any ointments or creams to your incision. You may have skin glue on your incision. Do not peel it off. It will come off on its own in about one week. Your incision may feel thickened and raised for several weeks after your surgery. This is normal and the skin will soften over time. For Men Only: It's OK to shave around the incision but do not shave the incision itself for 2 weeks. It is common to have numbness under your chin that could last for several months.  Diet  Resume your normal diet. There are no special food restrictions following this procedure. A low fat/low cholesterol diet is recommended for all patients with vascular disease. In order to heal from your surgery, it is CRITICAL to get adequate nutrition. Your body requires vitamins, minerals, and protein. Vegetables are the best  source of vitamins and minerals. Vegetables also provide the perfect balance of protein. Processed food has little nutritional value, so try to avoid this.        Medications  Resume taking all of your medications unless your doctor or physician assistant tells you not to. If your incision is causing pain, you may take over-the- counter pain relievers such as acetaminophen (Tylenol). If you were prescribed a stronger pain medication, please be aware these medications can cause nausea and constipation. Prevent nausea by taking the medication with a snack or meal. Avoid constipation by drinking plenty of fluids and eating foods with a high amount of fiber, such as fruits, vegetables, and grains. Do not take Tylenol if you are taking prescription pain medications.  Follow Up  Our office will schedule a follow up appointment 2-3 weeks following discharge.  Please call us immediately for any of the following conditions  Increased pain, redness, drainage (pus) from your incision site. Fever of 101 degrees or higher. If you should develop stroke (slurred speech, difficulty swallowing, weakness on one side of your body, loss of vision) you should call 911 and go to the nearest emergency room.  Reduce your risk of vascular disease:  Stop smoking. If you would like help call [facility] at 1-800-QUIT-NOW (1-800-784-8669) or [place] at 336-586-4000. Manage your cholesterol Maintain a desired weight Control your diabetes Keep your blood pressure down  If you have any questions, please call the office at [phone].   

## 2020-11-24 ENCOUNTER — Encounter (HOSPITAL_COMMUNITY): Payer: Self-pay | Admitting: Surgery

## 2020-11-24 NOTE — Anesthesia Postprocedure Evaluation (Signed)
Anesthesia Post Note  Patient: [name]  Procedure(s) Performed: LEFT CAROTID ENDARTERECTOMY (Left Neck) PATCH ANGIOPLASTY (Left Neck)     Patient location during evaluation: PACU Anesthesia Type: General Level of consciousness: awake and alert Pain management: pain level controlled Vital Signs Assessment: post-procedure vital signs reviewed and stable Respiratory status: spontaneous breathing, nonlabored ventilation, respiratory function stable and patient connected to nasal cannula oxygen Cardiovascular status: blood pressure returned to baseline and stable Postop Assessment: no apparent nausea or vomiting Anesthetic complications: no   No complications documented.  Last Vitals:  Vitals:   11/22/20 0800 11/22/20 1112  BP: 124/65 100/73  Pulse: (!) 49 (!) 49  Resp: 15 18  Temp: 36.7 C 36.6 C  SpO2: 96% 96%    Last Pain:  Vitals:   11/22/20 1112  TempSrc: Oral  PainSc:                  [name]

## 2020-12-08 ENCOUNTER — Other Ambulatory Visit: Payer: Self-pay

## 2020-12-08 ENCOUNTER — Ambulatory Visit (INDEPENDENT_AMBULATORY_CARE_PROVIDER_SITE_OTHER): Payer: Medicare HMO | Admitting: Physician Assistant

## 2020-12-08 VITALS — BP 157/64 | HR 58 | Temp 98.6°F | Resp 20 | Ht 67.0 in | Wt 188.7 lb

## 2020-12-08 DIAGNOSIS — I214 Non-ST elevation (NSTEMI) myocardial infarction: Secondary | ICD-10-CM | POA: Insufficient documentation

## 2020-12-08 DIAGNOSIS — E663 Overweight: Secondary | ICD-10-CM | POA: Insufficient documentation

## 2020-12-08 DIAGNOSIS — I6523 Occlusion and stenosis of bilateral carotid arteries: Secondary | ICD-10-CM

## 2020-12-08 NOTE — Progress Notes (Signed)
  POST OPERATIVE OFFICE NOTE    CC:  F/u for surgery  HPI:  This is a 80 y.o. male who is s/p left CEA on 11/21/20 by Dr. Myra Gianotti.    Pt returns today for follow up.  Pt states he denise amaurosis, weakness and aphasia.  He does note a facial droop while eating.  Allergies  Allergen Reactions  . Morphine Nausea Only    Current Outpatient Medications  Medication Sig Dispense Refill  . amLODipine (NORVASC) 10 MG tablet TAKE 1 TABLET ONCE DAILY. (Patient taking differently: Take 10 mg by mouth every evening.) 90 tablet 3  . aspirin 81 MG chewable tablet Chew 81 mg by mouth every evening.    Marland Kitchen atorvastatin (LIPITOR) 80 MG tablet TAKE ONE TABLET BY MOUTH DAILY (Patient taking differently: Take 80 mg by mouth daily.) 90 tablet 3  . dapagliflozin propanediol (FARXIGA) 10 MG TABS tablet Take 1 tablet (10 mg total) by mouth daily before breakfast. 30 tablet 11  . ezetimibe (ZETIA) 10 MG tablet TAKE 1 TABLET BY MOUTH DAILY. (Patient taking differently: Take 10 mg by mouth daily.) 90 tablet 3  . ibuprofen (ADVIL) 200 MG tablet Take 400 mg by mouth every 8 (eight) hours as needed (pain).    . metoprolol succinate (TOPROL XL) 25 MG 24 hr tablet Take 1 tablet (25 mg total) by mouth daily. (Patient taking differently: Take 25 mg by mouth every evening.) 90 tablet 3  . nitroGLYCERIN (NITROSTAT) 0.4 MG SL tablet 1 TAB UNDER TONGUE AS NEEDED FOR CHEST PAIN. MAY REPEAT EVERY 5 MIN FOR A TOTAL OF 3 DOSES. (Patient taking differently: Place 0.4 mg under the tongue every 5 (five) minutes x 3 doses as needed for chest pain.) 25 tablet 0  . oxyCODONE-acetaminophen (PERCOCET/ROXICET) 5-325 MG tablet Take 1 tablet by mouth every 4 (four) hours as needed for moderate pain. 20 tablet 0  . pantoprazole (PROTONIX) 40 MG tablet TAKE 1 TABLET ONCE DAILY. 90 tablet 3  . sacubitril-valsartan (ENTRESTO) 97-103 MG Take 1 tablet by mouth 2 (two) times daily. 60 tablet 6  . spironolactone (ALDACTONE) 25 MG tablet Take 1 tablet  (25 mg total) by mouth daily. 90 tablet 3  . Tamsulosin HCl (FLOMAX) 0.4 MG CAPS Take 0.8 mg by mouth at bedtime.      No current facility-administered medications for this visit.     ROS:  See HPI  Physical Exam:    Incision:  Well healed without signs of infection or hematoma. Extremities:  Motor, strength and sensation intact all 4 extremities Neuro: left facial droop Lungs non labored breathing   Assessment/Plan:  This is a 80 y.o. male who is s/p: Left CEA for 90% stenosis asymptomatic  He has 75% stenosis on the right ICA and is asymptomatic.  He will f/u for carotid surveillance and repeat duplex.  We reviewed signs and symptoms of stroke/TIA if these occur he will call 911. Cont. Asa and Lipitor.   Mosetta Pigeon PA-C [facility] [phone]   Clinic MD:  Myra Gianotti

## 2020-12-15 ENCOUNTER — Other Ambulatory Visit: Payer: Self-pay

## 2020-12-15 DIAGNOSIS — I6523 Occlusion and stenosis of bilateral carotid arteries: Secondary | ICD-10-CM

## 2020-12-30 ENCOUNTER — Other Ambulatory Visit (HOSPITAL_COMMUNITY): Payer: Self-pay

## 2020-12-30 ENCOUNTER — Telehealth (HOSPITAL_COMMUNITY): Payer: Self-pay | Admitting: Pharmacy Technician

## 2020-12-30 NOTE — Telephone Encounter (Signed)
Spoke with patient regarding Sherryll Burger and Marcelline Deist co-pays. He is currently in the donut hole, causing his co-pays to rise considerably. Starting a Therapist, nutritional. The patient will come to the check in desk and sign for it.   Unfortunately, the patient does not qualify for Farxiga assistance due to his income. I cant see the co-pay for the Farxiga until 5/9. Will provide a month of Farxiga samples. On 5/9 will run a test claim and see how affordable it will be at that time. The thought is that hopefully with taking away the [place] cost, he will be able to afford to get the [place] until he comes out of the donut hole. The co-pay will be cheaper once he reaches catastrophic coverage.   LOT [id number] EXP 04/13/2023  Will fax [employer] application once signatures are received.

## 2021-01-01 ENCOUNTER — Telehealth (HOSPITAL_COMMUNITY): Payer: Self-pay | Admitting: Pharmacy Technician

## 2021-01-01 NOTE — Telephone Encounter (Signed)
Sent in [employer] application via fax.  Will follow up.  

## 2021-01-05 NOTE — Telephone Encounter (Signed)
Advanced Heart Failure Patient Advocate Encounter   Patient was approved to receive Entresto from [employer]  Patient ID: [id number]  Effective dates: 01/05/21 through 09/12/21  Called and spoke with the patient.

## 2021-01-06 ENCOUNTER — Other Ambulatory Visit (HOSPITAL_COMMUNITY): Payer: Self-pay | Admitting: Internal Medicine

## 2021-01-07 DIAGNOSIS — Z1331 Encounter for screening for depression: Secondary | ICD-10-CM | POA: Diagnosis not present

## 2021-01-07 DIAGNOSIS — D692 Other nonthrombocytopenic purpura: Secondary | ICD-10-CM | POA: Diagnosis not present

## 2021-01-07 DIAGNOSIS — Z1339 Encounter for screening examination for other mental health and behavioral disorders: Secondary | ICD-10-CM | POA: Diagnosis not present

## 2021-01-07 DIAGNOSIS — E663 Overweight: Secondary | ICD-10-CM | POA: Diagnosis not present

## 2021-01-07 DIAGNOSIS — M1990 Unspecified osteoarthritis, unspecified site: Secondary | ICD-10-CM | POA: Diagnosis not present

## 2021-01-07 DIAGNOSIS — I119 Hypertensive heart disease without heart failure: Secondary | ICD-10-CM | POA: Diagnosis not present

## 2021-01-07 DIAGNOSIS — E7800 Pure hypercholesterolemia, unspecified: Secondary | ICD-10-CM | POA: Diagnosis not present

## 2021-01-07 DIAGNOSIS — I2510 Atherosclerotic heart disease of native coronary artery without angina pectoris: Secondary | ICD-10-CM | POA: Diagnosis not present

## 2021-01-07 DIAGNOSIS — L989 Disorder of the skin and subcutaneous tissue, unspecified: Secondary | ICD-10-CM | POA: Diagnosis not present

## 2021-01-07 DIAGNOSIS — M722 Plantar fascial fibromatosis: Secondary | ICD-10-CM | POA: Diagnosis not present

## 2021-01-07 DIAGNOSIS — D638 Anemia in other chronic diseases classified elsewhere: Secondary | ICD-10-CM | POA: Diagnosis not present

## 2021-01-07 DIAGNOSIS — I779 Disorder of arteries and arterioles, unspecified: Secondary | ICD-10-CM | POA: Diagnosis not present

## 2021-01-19 ENCOUNTER — Other Ambulatory Visit (HOSPITAL_COMMUNITY): Payer: Self-pay

## 2021-01-19 ENCOUNTER — Telehealth (HOSPITAL_COMMUNITY): Payer: Self-pay | Admitting: Pharmacy Technician

## 2021-01-19 NOTE — Telephone Encounter (Signed)
Advanced Heart Failure Patient Advocate Encounter  I was able to complete a co-pay investigation for Farxiga this morning. The 30 day co-pay is $148.31, 90 days is $444.68 while he is in the donut hole.  Called to discuss co-pays and affordability, left message with the patient's wife.

## 2021-01-19 NOTE — Telephone Encounter (Signed)
Spoke with the patient, he is going to fill 30 days at a time for now.  Archer Asa, CPhT

## 2021-01-24 ENCOUNTER — Other Ambulatory Visit (HOSPITAL_COMMUNITY): Payer: Self-pay | Admitting: Internal Medicine

## 2021-02-09 ENCOUNTER — Other Ambulatory Visit (HOSPITAL_COMMUNITY): Payer: Self-pay | Admitting: Internal Medicine

## 2021-02-11 ENCOUNTER — Other Ambulatory Visit (HOSPITAL_COMMUNITY): Payer: Self-pay | Admitting: Internal Medicine

## 2021-02-20 DIAGNOSIS — M545 Low back pain, unspecified: Secondary | ICD-10-CM | POA: Diagnosis not present

## 2021-03-02 DIAGNOSIS — M25551 Pain in right hip: Secondary | ICD-10-CM | POA: Diagnosis not present

## 2021-03-02 DIAGNOSIS — R269 Unspecified abnormalities of gait and mobility: Secondary | ICD-10-CM | POA: Diagnosis not present

## 2021-03-05 DIAGNOSIS — M25551 Pain in right hip: Secondary | ICD-10-CM | POA: Diagnosis not present

## 2021-03-05 DIAGNOSIS — R269 Unspecified abnormalities of gait and mobility: Secondary | ICD-10-CM | POA: Diagnosis not present

## 2021-03-09 ENCOUNTER — Encounter (HOSPITAL_COMMUNITY): Payer: Self-pay | Admitting: Internal Medicine

## 2021-03-09 ENCOUNTER — Ambulatory Visit (HOSPITAL_COMMUNITY)
Admission: RE | Admit: 2021-03-09 | Discharge: 2021-03-09 | Disposition: A | Discharge status: Home / Self Care | Payer: Medicare HMO | Source: Ambulatory Visit | Attending: Internal Medicine | Admitting: Internal Medicine

## 2021-03-09 ENCOUNTER — Other Ambulatory Visit: Payer: Self-pay

## 2021-03-09 VITALS — BP 150/70 | HR 62 | Wt 186.2 lb

## 2021-03-09 DIAGNOSIS — Z8249 Family history of ischemic heart disease and other diseases of the circulatory system: Secondary | ICD-10-CM | POA: Insufficient documentation

## 2021-03-09 DIAGNOSIS — Z885 Allergy status to narcotic agent status: Secondary | ICD-10-CM | POA: Diagnosis not present

## 2021-03-09 DIAGNOSIS — I5020 Unspecified systolic (congestive) heart failure: Secondary | ICD-10-CM | POA: Insufficient documentation

## 2021-03-09 DIAGNOSIS — I255 Ischemic cardiomyopathy: Secondary | ICD-10-CM | POA: Insufficient documentation

## 2021-03-09 DIAGNOSIS — E785 Hyperlipidemia, unspecified: Secondary | ICD-10-CM | POA: Diagnosis not present

## 2021-03-09 DIAGNOSIS — Z7984 Long term (current) use of oral hypoglycemic drugs: Secondary | ICD-10-CM | POA: Diagnosis not present

## 2021-03-09 DIAGNOSIS — Z951 Presence of aortocoronary bypass graft: Secondary | ICD-10-CM | POA: Diagnosis not present

## 2021-03-09 DIAGNOSIS — I110 Hypertensive heart disease with heart failure: Secondary | ICD-10-CM | POA: Insufficient documentation

## 2021-03-09 DIAGNOSIS — Z7982 Long term (current) use of aspirin: Secondary | ICD-10-CM | POA: Diagnosis not present

## 2021-03-09 DIAGNOSIS — Z955 Presence of coronary angioplasty implant and graft: Secondary | ICD-10-CM | POA: Diagnosis not present

## 2021-03-09 DIAGNOSIS — M79672 Pain in left foot: Secondary | ICD-10-CM | POA: Insufficient documentation

## 2021-03-09 DIAGNOSIS — Z79899 Other long term (current) drug therapy: Secondary | ICD-10-CM | POA: Diagnosis not present

## 2021-03-09 DIAGNOSIS — I6523 Occlusion and stenosis of bilateral carotid arteries: Secondary | ICD-10-CM | POA: Insufficient documentation

## 2021-03-09 DIAGNOSIS — I5022 Chronic systolic (congestive) heart failure: Secondary | ICD-10-CM

## 2021-03-09 DIAGNOSIS — I2510 Atherosclerotic heart disease of native coronary artery without angina pectoris: Secondary | ICD-10-CM | POA: Insufficient documentation

## 2021-03-09 NOTE — Patient Instructions (Signed)
Your physician has requested that you have an echocardiogram. Echocardiography is a painless test that uses sound waves to create images of your heart. It provides your doctor with information about the size and shape of your heart and how well your heart's chambers and valves are working. This procedure takes approximately one hour. There are no restrictions for this procedure.  Please call our office in November to schedule your follow up appointment  If you have any questions or concerns before your next appointment please send [place] a message through [place] or call our office at [phone].    TO LEAVE A MESSAGE FOR THE NURSE SELECT OPTION 2, PLEASE LEAVE A MESSAGE INCLUDING: YOUR NAME DATE OF BIRTH CALL BACK NUMBER REASON FOR CALL**this is important as we prioritize the call backs  YOU WILL RECEIVE A CALL BACK THE SAME DAY AS LONG AS YOU CALL BEFORE 4:00 PM  At the [facility], you and your health needs are our priority. As part of our continuing mission to provide you with exceptional heart care, we have created designated Provider Care Teams. These Care Teams include your primary Cardiologist (physician) and Advanced Practice Providers (APPs- Physician Assistants and Nurse Practitioners) who all work together to provide you with the care you need, when you need it.   You may see any of the following providers on your designated Care Team at your next follow up: Dr Arvilla Meres Dr Marca Ancona Dr Brandon Melnick, NP Robbie Lis, [place] Mikki Santee Karle Plumber, PharmD   Please be sure to bring in all your medications bottles to every appointment.

## 2021-03-09 NOTE — Progress Notes (Signed)
Advanced Heart Failure Clinic Note   Date:  03/09/2021   ID:  [name], DOB 1941/09/08, MRN [id number]  Location: Home  Provider location: [facility] Type of Visit: Established patient  PCP:  Tisovec, Adelfa Koh, MD  Cardiologist:  None Primary HF: Milagro Belmares  Chief Complaint: Heart Failure    History of Present Illness:  [name] is a 80 y.o. male with history of CAD, multiple angioplasties to the RCA dating back to 1983 followed by CABG in 2012, HTN, and hyperlipidemia.  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    He was hospitalized in 11/11 because of recurrent chest pain. He underwent drug-eluting stent to the proximal RCA July 24, 2010. Ejection fraction was 65%. He also had 99% stenosis in the distal PDA with left to right collaterals and nonobstructive disease in the left coronary tree.   Admitted in October 2012 with [place]. Found to have progressive CAD. Underwent Coronary artery bypass grafting x4 (left internal mammary artery to LAD, saphenous vein graft to circumflex marginal, saphenous vein graft to posterior descending branch of the distal right coronary, saphenous vein graft to the posterolateral branch of the distal right coronary artery).   Had recurrent CP in 8/20 underwent cath which showed stable anatomy no targets for PCI  1. 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 LAD 100% 3. RCA 100% 4. LCX 50% in mid LCX 5. LIMA to LAD widely patent 6. SVG to R PDA widely patent 7. SVG to OM occluded ostially 8. SVG to PL occluded ostially Has moderate disease in anomalous LCX but not crittcal and anatomy precludes PCI. 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 He was discharged 04/23/20. On 04/25/20 he had AMS was sent to [facility] ED. HS Trop 111-->1421 -> 17,390. It was unclear if this was demand ischemia. Had LHC on 04/28/20 with no targets for  revascularization and medical management. ECHO showed EF 40-45%.  Today he returns for HF follow up.Overall feeling fine. Denies SOB/PND/Orthopnea.  No chest pain. Limited by left foot pain. Appetite ok.  No fever or chills. SBP  130-140s. Weight at home has gone down to 180 pounds. Taking all medications.   Carotid u/s with [facility] ([facility]) 1/22 60-79% bilaterally  s/p L CEA 3/22  Past Medical History:  Diagnosis Date   Arthritis    hips   CAD (coronary artery disease)    s/p multiple PCIs to RCA dating back to 1983. Last stent to proximal RCA in November of 2011. Also had 99% stenosis in the distal PDA with left to right collaterals and nonobstructive disease in the left coronary tree. ;  ISR of RCA 10/12 - CABG 10/9 with Dr. Zenaida Niece Trigt: L-LAD, S-OM, S-PDA   Carotid bruit    carotid u/s 2/12: 40-59% bilaterally. (stable)   GERD (gastroesophageal reflux disease)    HTN (hypertension)    Hyperlipidemia    Past Surgical History:  Procedure Laterality Date   CARDIAC CATHETERIZATION     COLONOSCOPY     Coronary artery bypass grafting x4 (left internal mammary artery  06/23/2011   CORONARY STENT PLACEMENT  Nov 2011   prox RCA. Has had prior multiple procedures.   CORONARY/GRAFT ANGIOGRAPHY N/A 05/04/2019   Procedure: CORONARY/GRAFT ANGIOGRAPHY;  Surgeon: Dolores Patty, MD;  Location: [facility] INVASIVE CV LAB;  Service: Cardiovascular;  Laterality: N/A;  ENDARTERECTOMY Left 11/21/2020   Procedure: LEFT CAROTID ENDARTERECTOMY;  Surgeon: Nada Libman, MD;  Location: [facility] OR;  Service: Vascular;  Laterality: Left;   INGUINAL HERNIA REPAIR Right 06/10/2020   Procedure: RIGHT INGUINAL HERNIA;  Surgeon: Harriette Bouillon, MD;  Location: [facility] OR;  Service: General;  Laterality: Right;  GENERAL AND TAP BLOCK   INSERTION OF MESH Right 06/10/2020   Procedure: INSERTION OF MESH;  Surgeon: Harriette Bouillon, MD;  Location: [facility] OR;  Service: General;  Laterality: Right;   JOINT REPLACEMENT     LEFT HEART CATH  AND CORS/GRAFTS ANGIOGRAPHY N/A 04/28/2020   Procedure: LEFT HEART CATH AND CORS/GRAFTS ANGIOGRAPHY;  Surgeon: Runell Gess, MD;  Location: [facility] INVASIVE CV LAB;  Service: Cardiovascular;  Laterality: N/A;   left total hip replacement  2008   PATCH ANGIOPLASTY Left 11/21/2020   Procedure: PATCH ANGIOPLASTY;  Surgeon: Nada Libman, MD;  Location: [facility] OR;  Service: Vascular;  Laterality: Left;   TONSILLECTOMY     TOOTH EXTRACTION  06/2013   TOTAL HIP ARTHROPLASTY Right 04/22/2020   Procedure: TOTAL HIP ARTHROPLASTY;  Surgeon: Teryl Lucy, MD;  Location: [facility] ORS;  Service: Orthopedics;  Laterality: Right;     Current Outpatient Medications  Medication Sig Dispense Refill   acetaminophen (TYLENOL) 325 MG tablet Take 650 mg by mouth every 6 (six) hours as needed.     amLODipine (NORVASC) 10 MG tablet TAKE 1 TABLET ONCE DAILY. 90 tablet 3   aspirin 81 MG chewable tablet Chew 81 mg by mouth every evening.     atorvastatin (LIPITOR) 80 MG tablet TAKE ONE TABLET BY MOUTH DAILY 90 tablet 0   dapagliflozin propanediol (FARXIGA) 10 MG TABS tablet Take 1 tablet (10 mg total) by mouth daily before breakfast. 30 tablet 11   ENTRESTO 97-103 MG TAKE 1 TABLET BY MOUTH TWO TIMES A DAY 180 tablet 3   ezetimibe (ZETIA) 10 MG tablet TAKE 1 TABLET BY MOUTH DAILY. 90 tablet 3   metoprolol succinate (TOPROL XL) 25 MG 24 hr tablet Take 1 tablet (25 mg total) by mouth daily. 90 tablet 3   nitroGLYCERIN (NITROSTAT) 0.4 MG SL tablet 1 TAB UNDER TONGUE AS NEEDED FOR CHEST PAIN. MAY REPEAT EVERY 5 MIN FOR A TOTAL OF 3 DOSES. 25 tablet 0   pantoprazole (PROTONIX) 40 MG tablet TAKE 1 TABLET ONCE DAILY. 90 tablet 3   spironolactone (ALDACTONE) 25 MG tablet Take 1 tablet (25 mg total) by mouth daily. 90 tablet 3   Tamsulosin HCl (FLOMAX) 0.4 MG CAPS Take 0.8 mg by mouth at bedtime.      No current facility-administered medications for this encounter.    Allergies:   Morphine   Social History:  The patient  reports  that he has never smoked. He has never used smokeless tobacco. He reports current alcohol use of about 6.0 standard drinks of alcohol per week. He reports that he does not use drugs.   Family History:  The patient's family history includes Heart disease in his father.   ROS:  Please see the history of present illness.   All other systems are personally reviewed and negative.   Vitals:   03/09/21 1116  BP: (!) 150/70  Pulse: 62  SpO2: 97%   Wt Readings from Last 3 Encounters:  03/09/21 84.5 kg (186 lb 3.2 oz)  12/08/20 85.6 kg (188 lb 11.2 oz)  11/21/20 84.2 kg (185 lb 10 oz)     Exam:   General:  Well appearing.  No resp difficulty. Walked in the clinic.  HEENT: normal Neck: supple. no JVD. Carotids 2+ bilat; no bruits. No lymphadenopathy or thryomegaly appreciated. Cor: PMI nondisplaced. Regular rate & rhythm. No rubs, gallops or murmurs. Lungs: clear Abdomen: soft, nontender, nondistended. No hepatosplenomegaly. No bruits or masses. Good bowel sounds. Extremities: no cyanosis, clubbing, rash, edema Neuro: alert & orientedx3, cranial nerves grossly intact. moves all 4 extremities w/o difficulty. Affect pleasant  Recent Labs: 04/25/2020: B Natriuretic Peptide 208.4 11/20/2020: ALT 28 11/22/2020: BUN 16; Creatinine, Ser 0.85; Hemoglobin 11.7; Platelets 208; Potassium 4.0; Sodium 135  Personally reviewed   Wt Readings from Last 3 Encounters:  03/09/21 84.5 kg (186 lb 3.2 oz)  12/08/20 85.6 kg (188 lb 11.2 oz)  11/21/20 84.2 kg (185 lb 10 oz)    ASSESSMENT AND PLAN:  1. CAD S/p CABG 2012 - Cath 04/2020 with stable revascularization. - No chest pain.  - Continue statin and ASA.  2. 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 Set up for repeat ECHO  - NYHA I. Volume status stable.  - Cotninue Entresto 97/103 mg bid. - Continue spiro 25 mg daily. - Continue Toprol XL 25 mg daily. Heart rate limits titration.  - Continue Farxiga 10 mg daily. -Asking  Dr Wylene Simmer for most recent BMET   3. HTN - Home BP readings have been stable.  -Higher in the office.   4. Carotid stenosis - Stable 40-59% disease bilateral carotids. CCA with >50% hemodynamically significant plaque.  - CT scan (2/22) shows a 90% left carotid stenosis.  - L CEA 11/2020   5. HLD - Continue atorvastatin 80 mg daily + Zetia. - LDL 66 in 8/21. Followed by Dr. Wylene Simmer.  Follow up 6 months with Dr Gala Romney  Check ECHO   Tonye Becket, NP -C 03/09/2021 11:21 AM  Advanced Heart Failure Clinic [facility] [address] [facility] [place] [address] [phone] (office) [phone] (fax)  Patient seen and examined with the above-signed Advanced Practice Provider and/or Housestaff. I personally reviewed laboratory data, imaging studies and relevant notes. I independently examined the patient and formulated the important aspects of the plan. I have edited the note to reflect any of my changes or salient points. I have personally discussed the plan with the patient and/or family.  Doing very well. No angina or HF symptoms. BP well controlled. On excellent GDMT.   General:  Well appearing. No resp difficulty HEENT: normal Neck: supple. no JVD. Carotids 2+ bilat; loud R bruit. No lymphadenopathy or thryomegaly appreciated. Cor: PMI nondisplaced. Regular rate & rhythm. Occasional skipped beat. No rubs, gallops or murmurs. Lungs: clear Abdomen: soft, nontender, nondistended. No hepatosplenomegaly. No bruits or masses. Good bowel sounds. Extremities: no cyanosis, clubbing, rash, edema Neuro: alert & orientedx3, cranial nerves grossly intact. moves all 4 extremities w/o difficulty. Affect pleasant  Stable. NYHA I. Looks great. Will need repeat echo. Will review labs. Continue F/u with [facility]   Arvilla Meres, MD  12:16 PM

## 2021-03-10 DIAGNOSIS — R269 Unspecified abnormalities of gait and mobility: Secondary | ICD-10-CM | POA: Diagnosis not present

## 2021-03-10 DIAGNOSIS — M25551 Pain in right hip: Secondary | ICD-10-CM | POA: Diagnosis not present

## 2021-03-11 ENCOUNTER — Other Ambulatory Visit (HOSPITAL_COMMUNITY): Payer: Self-pay | Admitting: Internal Medicine

## 2021-03-12 DIAGNOSIS — R269 Unspecified abnormalities of gait and mobility: Secondary | ICD-10-CM | POA: Diagnosis not present

## 2021-03-12 DIAGNOSIS — M25551 Pain in right hip: Secondary | ICD-10-CM | POA: Diagnosis not present

## 2021-03-18 NOTE — Progress Notes (Signed)
HISTORY AND PHYSICAL     CC:  follow up. Requesting Provider:  Gaspar Garbe, MD  HPI: This is a 80 y.o. male here for follow up for carotid artery stenosis.  Pt is s/p left CEA for asymptomatic carotid artery stenosis on 11/21/2020 by Dr. Myra Gianotti.  He had increased velocities in the CCA and was sent for CT scan for further evaluation.  This revealed a 90% left carotid stenosis.  He also had family hx of AAA and had duplex, which revealed maximum diameter of 1.9cm and this did not need further follow up.    Pt was last seen 12/08/2020 for his post op visit and at that time he remained asymptomatic.  Given 75% stenosis on the right, which was asymptomatic, he was scheduled for return visit with duplex.    Pt returns today for follow up.    Pt deines any amaurosis fugax, speech difficulties, weakness, numbness, paralysis or clumsiness or facial droop.  He states he does have some numbness around the left neck incision.  He states that his memory is getting worse but no stroke sx.  He denies any claudication.    Pt has hx of CABG with [facility] harvest in 2012.   The pt is on a statin for cholesterol management.  The pt is on a daily aspirin.   Other AC:  none The pt is on CCB, BB for hypertension.   The pt is not diabetic.   Tobacco hx:  never  Pt does have family hx of AAA.   He states his mom at age 26 was going into the OR to have it repaired and it ruptured at that point, but she did recover and lived another 6 years.  He has been screened and no evidence of AAA.  Past Medical History:  Diagnosis Date   Arthritis    hips   CAD (coronary artery disease)    s/p multiple PCIs to RCA dating back to 89. Last stent to proximal RCA in November of 2011. Also had 99% stenosis in the distal PDA with left to right collaterals and nonobstructive disease in the left coronary tree. ;  ISR of RCA 10/12 - CABG 10/9 with Dr. Zenaida Niece Trigt: L-LAD, S-OM, S-PDA   Carotid bruit    carotid u/s 2/12: 40-59%  bilaterally. (stable)   GERD (gastroesophageal reflux disease)    HTN (hypertension)    Hyperlipidemia     Past Surgical History:  Procedure Laterality Date   CARDIAC CATHETERIZATION     COLONOSCOPY     Coronary artery bypass grafting x4 (left internal mammary artery  06/23/2011   CORONARY STENT PLACEMENT  Nov 2011   prox RCA. Has had prior multiple procedures.   CORONARY/GRAFT ANGIOGRAPHY N/A 05/04/2019   Procedure: CORONARY/GRAFT ANGIOGRAPHY;  Surgeon: Dolores Patty, MD;  Location: [facility] INVASIVE CV LAB;  Service: Cardiovascular;  Laterality: N/A;   ENDARTERECTOMY Left 11/21/2020   Procedure: LEFT CAROTID ENDARTERECTOMY;  Surgeon: Nada Libman, MD;  Location: [facility] OR;  Service: Vascular;  Laterality: Left;   INGUINAL HERNIA REPAIR Right 06/10/2020   Procedure: RIGHT INGUINAL HERNIA;  Surgeon: Harriette Bouillon, MD;  Location: [facility] OR;  Service: General;  Laterality: Right;  GENERAL AND TAP BLOCK   INSERTION OF MESH Right 06/10/2020   Procedure: INSERTION OF MESH;  Surgeon: Harriette Bouillon, MD;  Location: [facility] OR;  Service: General;  Laterality: Right;   JOINT REPLACEMENT     LEFT HEART CATH AND CORS/GRAFTS ANGIOGRAPHY N/A 04/28/2020  Procedure: LEFT HEART CATH AND CORS/GRAFTS ANGIOGRAPHY;  Surgeon: Runell Gess, MD;  Location: [facility] INVASIVE CV LAB;  Service: Cardiovascular;  Laterality: N/A;   left total hip replacement  2008   PATCH ANGIOPLASTY Left 11/21/2020   Procedure: PATCH ANGIOPLASTY;  Surgeon: Nada Libman, MD;  Location: [facility] OR;  Service: Vascular;  Laterality: Left;   TONSILLECTOMY     TOOTH EXTRACTION  06/2013   TOTAL HIP ARTHROPLASTY Right 04/22/2020   Procedure: TOTAL HIP ARTHROPLASTY;  Surgeon: Teryl Lucy, MD;  Location: [facility] ORS;  Service: Orthopedics;  Laterality: Right;    Allergies  Allergen Reactions   Morphine Nausea Only    Current Outpatient Medications  Medication Sig Dispense Refill   acetaminophen (TYLENOL) 325 MG tablet Take 650 mg by mouth every  6 (six) hours as needed.     amLODipine (NORVASC) 10 MG tablet TAKE 1 TABLET ONCE DAILY. 90 tablet 3   aspirin 81 MG chewable tablet Chew 81 mg by mouth every evening.     atorvastatin (LIPITOR) 80 MG tablet TAKE ONE TABLET BY MOUTH DAILY 90 tablet 0   dapagliflozin propanediol (FARXIGA) 10 MG TABS tablet Take 1 tablet (10 mg total) by mouth daily before breakfast. 30 tablet 11   ENTRESTO 97-103 MG TAKE 1 TABLET BY MOUTH TWO TIMES A DAY 180 tablet 3   ezetimibe (ZETIA) 10 MG tablet TAKE 1 TABLET BY MOUTH DAILY. 90 tablet 3   metoprolol succinate (TOPROL XL) 25 MG 24 hr tablet Take 1 tablet (25 mg total) by mouth daily. 90 tablet 3   nitroGLYCERIN (NITROSTAT) 0.4 MG SL tablet 1 TAB UNDER TONGUE AS NEEDED FOR CHEST PAIN. MAY REPEAT EVERY 5 MIN FOR A TOTAL OF 3 DOSES. 25 tablet 0   pantoprazole (PROTONIX) 40 MG tablet TAKE 1 TABLET ONCE DAILY. 90 tablet 3   spironolactone (ALDACTONE) 25 MG tablet Take 1 tablet (25 mg total) by mouth daily. 90 tablet 3   Tamsulosin HCl (FLOMAX) 0.4 MG CAPS Take 0.8 mg by mouth at bedtime.      No current facility-administered medications for this visit.    Family History  Problem Relation Age of Onset   Heart disease Father    Colon cancer Neg Hx    Stomach cancer Neg Hx     Social History   Socioeconomic History   Marital status: Married    Spouse name: Not on file   Number of children: Not on file   Years of education: Not on file   Highest education level: Not on file  Occupational History   Not on file  Tobacco Use   Smoking status: Never   Smokeless tobacco: Never  Vaping Use   Vaping Use: Never used  Substance and Sexual Activity   Alcohol use: Yes    Alcohol/week: 6.0 standard drinks    Types: 6 Cans of beer per week    Comment: weekly   Drug use: No   Sexual activity: Not on file  Other Topics Concern   Not on file  Social History Narrative   Not on file   Social Determinants of Health   Financial Resource Strain: Not on file   Food Insecurity: Not on file  Transportation Needs: Not on file  Physical Activity: Not on file  Stress: Not on file  Social Connections: Not on file  Intimate Partner Violence: Not on file     REVIEW OF SYSTEMS:   [X]  denotes positive finding, [ ]  denotes negative finding Cardiac  Comments:  Chest pain or chest pressure:    Shortness of breath upon exertion:    Short of breath when lying flat:    Irregular heart rhythm:        Vascular    Pain in calf, thigh, or hip brought on by ambulation:    Pain in feet at night that wakes you up from your sleep:     Blood clot in your veins:    Leg swelling:         Pulmonary    Oxygen at home:    Productive cough:     Wheezing:         Neurologic    Sudden weakness in arms or legs:     Sudden numbness in arms or legs:     Sudden onset of difficulty speaking or slurred speech:    Temporary loss of vision in one eye:     Problems with dizziness:         Gastrointestinal    Blood in stool:     Vomited blood:         Genitourinary    Burning when urinating:     Blood in urine:        Psychiatric    Major depression:         Hematologic    Bleeding problems:    Problems with blood clotting too easily:        Skin    Rashes or ulcers:        Constitutional    Fever or chills:      PHYSICAL EXAMINATION:  Today's Vitals   03/23/21 0933 03/23/21 0936  BP: (!) 167/63 (!) 164/65  Pulse: (!) 56   Resp: 20   Temp: 97.9 F (36.6 C)   SpO2: 99%   Weight: 187 lb (84.8 kg)   Height: 5\' 7"  (1.702 m)    Body mass index is 29.29 kg/m.   General:  WDWN in NAD; vital signs documented above Gait: Not observed HENT: WNL, normocephalic Pulmonary: normal non-labored breathing Cardiac: regular/brady HR, without carotid bruits Abdomen: soft, NT; aortic pulse is not palpable Skin: without rashes Vascular Exam/Pulses:  Right Left  Radial 2+ (normal) 2+ (normal)  Popliteal Unable to palpate Unable to palpate  DP Brisk  doppler signal Brisk doppler signal  PT Brisk doppler signal Brisk doppler signal   Extremities: without ischemic changes, without Gangrene , without cellulitis; without open wounds; trace pitting edema from socks.  Musculoskeletal: no muscle wasting or atrophy  Neurologic: A&O X 3; moving all extremities equally; speech is fluent/normal Psychiatric:  The pt has Normal affect.   Non-Invasive Vascular Imaging:   Carotid Duplex on 03/23/2021: Right:  60-79% ICA stenosis Left:  no evidence of ICA stenosis Vertebrals:  Bilateral vertebral arteries demonstrate antegrade flow.  Subclavians: Normal flow hemodynamics were seen in bilateral subclavian arteries.   Previous Carotid duplex on 10/08/2020: Right: 60-79% ICA stenosis Left:   60-79% ICA stenosis    ASSESSMENT/PLAN:: 80 y.o. male here for follow up carotid artery stenosis and is s/p left CEA for asymptomatic carotid artery stenosis on 11/21/2020 by Dr. 01/21/2021.  -duplex today reveals right continues to be 60-79%.  PSV velocities are improved on today's duplex -discussed s/s of stroke with pt and he understands should he develop any of these sx, he will go to the nearest ER or call 911. -pt will f/u in 6 months with carotid duplex -pt will call sooner should they have any  issues. -continue statin/asa   Doreatha MassedSamantha Shanik [place], [facility] [phone]  Clinic MD:  Chestine Sporelark on call MD

## 2021-03-19 DIAGNOSIS — M25551 Pain in right hip: Secondary | ICD-10-CM | POA: Diagnosis not present

## 2021-03-19 DIAGNOSIS — R269 Unspecified abnormalities of gait and mobility: Secondary | ICD-10-CM | POA: Diagnosis not present

## 2021-03-23 ENCOUNTER — Ambulatory Visit: Payer: Medicare HMO | Admitting: Physician Assistant

## 2021-03-23 ENCOUNTER — Other Ambulatory Visit: Payer: Self-pay

## 2021-03-23 ENCOUNTER — Ambulatory Visit (HOSPITAL_COMMUNITY)
Admission: RE | Admit: 2021-03-23 | Discharge: 2021-03-23 | Disposition: A | Discharge status: Home / Self Care | Payer: Medicare HMO | Source: Ambulatory Visit | Attending: Surgery | Admitting: Surgery

## 2021-03-23 VITALS — BP 164/65 | HR 56 | Temp 97.9°F | Resp 20 | Ht 67.0 in | Wt 187.0 lb

## 2021-03-23 DIAGNOSIS — I6523 Occlusion and stenosis of bilateral carotid arteries: Secondary | ICD-10-CM | POA: Diagnosis not present

## 2021-03-23 DIAGNOSIS — R269 Unspecified abnormalities of gait and mobility: Secondary | ICD-10-CM | POA: Diagnosis not present

## 2021-03-23 DIAGNOSIS — M25551 Pain in right hip: Secondary | ICD-10-CM | POA: Diagnosis not present

## 2021-03-25 ENCOUNTER — Other Ambulatory Visit: Payer: Self-pay

## 2021-03-25 DIAGNOSIS — R269 Unspecified abnormalities of gait and mobility: Secondary | ICD-10-CM | POA: Diagnosis not present

## 2021-03-25 DIAGNOSIS — M25551 Pain in right hip: Secondary | ICD-10-CM | POA: Diagnosis not present

## 2021-03-25 DIAGNOSIS — I6523 Occlusion and stenosis of bilateral carotid arteries: Secondary | ICD-10-CM

## 2021-03-30 DIAGNOSIS — R269 Unspecified abnormalities of gait and mobility: Secondary | ICD-10-CM | POA: Diagnosis not present

## 2021-03-30 DIAGNOSIS — M25551 Pain in right hip: Secondary | ICD-10-CM | POA: Diagnosis not present

## 2021-04-01 DIAGNOSIS — R269 Unspecified abnormalities of gait and mobility: Secondary | ICD-10-CM | POA: Diagnosis not present

## 2021-04-01 DIAGNOSIS — M25551 Pain in right hip: Secondary | ICD-10-CM | POA: Diagnosis not present

## 2021-04-04 ENCOUNTER — Other Ambulatory Visit (HOSPITAL_COMMUNITY): Payer: Self-pay | Admitting: Internal Medicine

## 2021-04-07 ENCOUNTER — Other Ambulatory Visit: Payer: Self-pay

## 2021-04-07 ENCOUNTER — Ambulatory Visit (HOSPITAL_COMMUNITY)
Admission: RE | Admit: 2021-04-07 | Discharge: 2021-04-07 | Disposition: A | Discharge status: Home / Self Care | Payer: Medicare HMO | Source: Ambulatory Visit | Attending: Internal Medicine | Admitting: Internal Medicine

## 2021-04-07 DIAGNOSIS — I5022 Chronic systolic (congestive) heart failure: Secondary | ICD-10-CM | POA: Diagnosis not present

## 2021-04-07 DIAGNOSIS — M25551 Pain in right hip: Secondary | ICD-10-CM | POA: Diagnosis not present

## 2021-04-07 DIAGNOSIS — I110 Hypertensive heart disease with heart failure: Secondary | ICD-10-CM | POA: Diagnosis not present

## 2021-04-07 DIAGNOSIS — R269 Unspecified abnormalities of gait and mobility: Secondary | ICD-10-CM | POA: Diagnosis not present

## 2021-04-07 DIAGNOSIS — E785 Hyperlipidemia, unspecified: Secondary | ICD-10-CM | POA: Insufficient documentation

## 2021-04-07 DIAGNOSIS — I2510 Atherosclerotic heart disease of native coronary artery without angina pectoris: Secondary | ICD-10-CM | POA: Diagnosis not present

## 2021-04-07 LAB — ECHOCARDIOGRAM COMPLETE
Area-P 1/2: 5.23 cm2
Calc EF: 56.9 %
S' Lateral: 4 cm
Single Plane A2C EF: 59.4 %
Single Plane A4C EF: 56.5 %

## 2021-04-07 NOTE — Progress Notes (Signed)
  Echocardiogram 2D Echocardiogram has been performed.  [name] 04/07/2021, 8:44 AM

## 2021-04-10 DIAGNOSIS — M1611 Unilateral primary osteoarthritis, right hip: Secondary | ICD-10-CM | POA: Diagnosis not present

## 2021-04-13 ENCOUNTER — Other Ambulatory Visit (HOSPITAL_COMMUNITY): Payer: Self-pay | Admitting: Internal Medicine

## 2021-04-21 DIAGNOSIS — M25551 Pain in right hip: Secondary | ICD-10-CM | POA: Diagnosis not present

## 2021-04-21 DIAGNOSIS — R269 Unspecified abnormalities of gait and mobility: Secondary | ICD-10-CM | POA: Diagnosis not present

## 2021-04-30 ENCOUNTER — Other Ambulatory Visit (HOSPITAL_COMMUNITY): Payer: Self-pay | Admitting: *Deleted

## 2021-04-30 ENCOUNTER — Telehealth (HOSPITAL_COMMUNITY): Payer: Self-pay | Admitting: Pharmacy Technician

## 2021-04-30 ENCOUNTER — Other Ambulatory Visit (HOSPITAL_COMMUNITY): Payer: Self-pay

## 2021-04-30 MED ORDER — DAPAGLIFLOZIN PROPANEDIOL 10 MG PO TABS: 10.0000 mg | ORAL_TABLET | Freq: Every day | ORAL | 3 refills | Status: DC

## 2021-04-30 NOTE — Telephone Encounter (Signed)
Advanced Heart Failure Patient Advocate Encounter  Received a call from the patient today concerning Entresto. Stated that every time he tries to call [employer] he could not get through. I called [employer] and placed a refill request for him. The anticipated delivery date is next Wednesday, 08/24. Called and relayed information to the patient.  He stated that he needed a refill for [place]. Since he is still in the donut hole, he wanted to fill a 30 day supply. I was able to obtain a PAN HF grant to help cover the co-pay. He wanted to go back to 90 day refills since he has the grant to help. Sent 90 day RX request to Harrell (CMA) to send to [facility]. Emailed the patient grant information.   Member ID: [id number] Group ID: [id number] RxBin ID: [id number] PCN: [id number] Eligibility Start Date: 01/30/2021 Eligibility End Date: 04/29/2022 Assistance Amount: $1,000.00  Advised the patient to call with any issues.    Archer Asa, CPhT

## 2021-06-15 DIAGNOSIS — H2513 Age-related nuclear cataract, bilateral: Secondary | ICD-10-CM | POA: Diagnosis not present

## 2021-07-07 ENCOUNTER — Other Ambulatory Visit (HOSPITAL_COMMUNITY): Payer: Self-pay | Admitting: Internal Medicine

## 2021-07-15 DIAGNOSIS — I119 Hypertensive heart disease without heart failure: Secondary | ICD-10-CM | POA: Diagnosis not present

## 2021-07-15 DIAGNOSIS — E7800 Pure hypercholesterolemia, unspecified: Secondary | ICD-10-CM | POA: Diagnosis not present

## 2021-07-15 DIAGNOSIS — Z125 Encounter for screening for malignant neoplasm of prostate: Secondary | ICD-10-CM | POA: Diagnosis not present

## 2021-07-16 IMAGING — DX DG HIP (WITH OR WITHOUT PELVIS) 1V PORT*R*
2 series · 2 of 2 positions shown · non-contrast
Comparison: None.

CLINICAL DATA: Status post right hip arthroplasty.

EXAM:
DG HIP (WITH OR WITHOUT PELVIS) 1V PORT RIGHT

[pelvis ap]
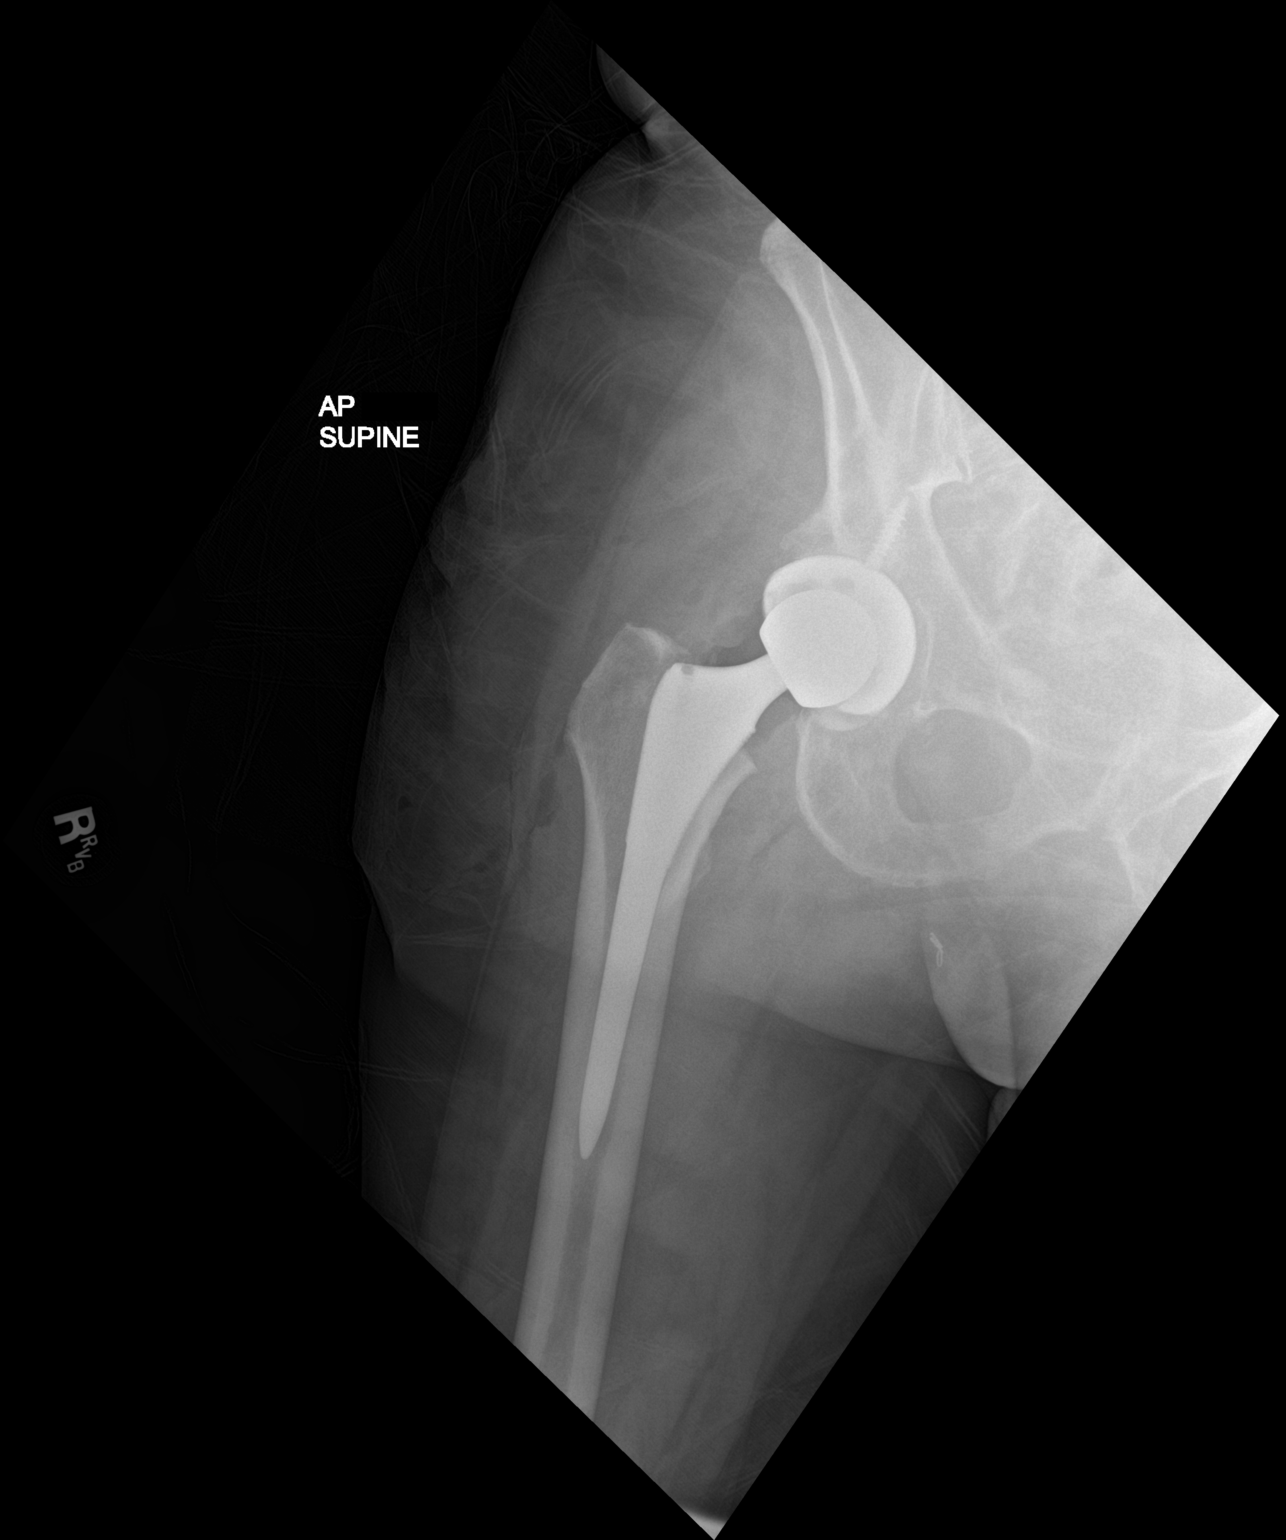

[hip frog leg]
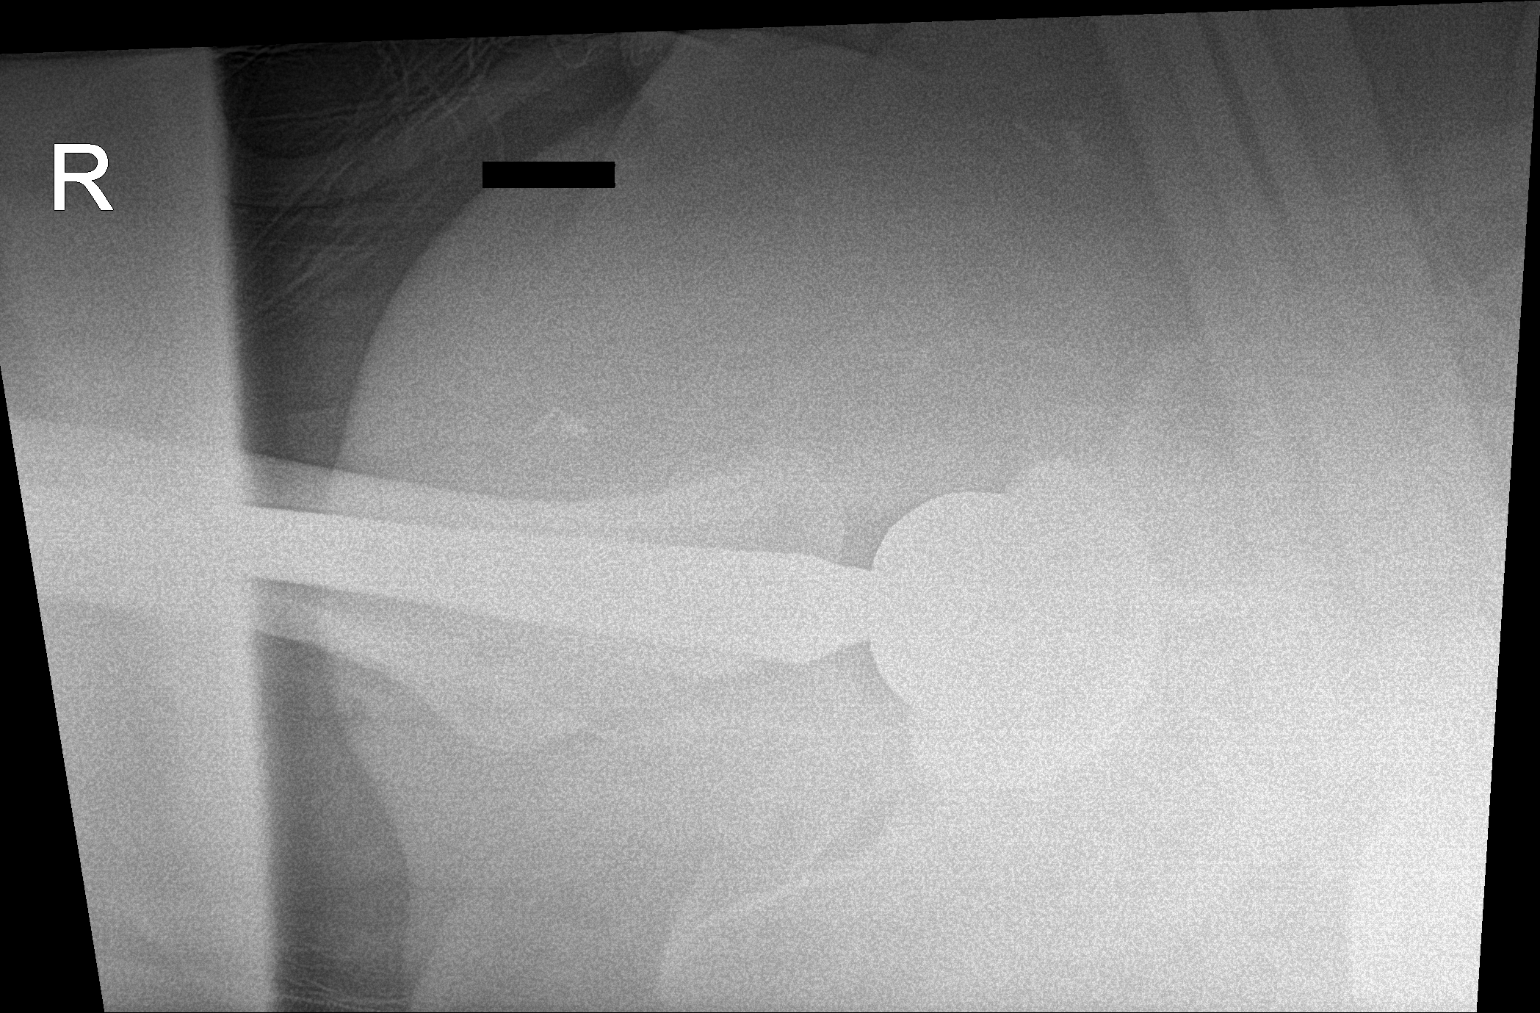

[2 of 2 positions shown; findings below may reference images not displayed]

FINDINGS: Right hip arthroplasty in expected alignment. No periprosthetic
lucency or fracture. Recent postsurgical change includes air and
edema in the soft tissues and joint space.
IMPRESSION: Right hip arthroplasty without immediate postoperative complication.

## 2021-07-16 IMAGING — DX DG PORTABLE PELVIS
1 series · 1 of 1 positions shown · non-contrast
Comparison: Concurrent hip exam.

CLINICAL DATA: Right hip arthroplasty today.

EXAM:
PORTABLE PELVIS 1-2 VIEWS

[pelvis ap]
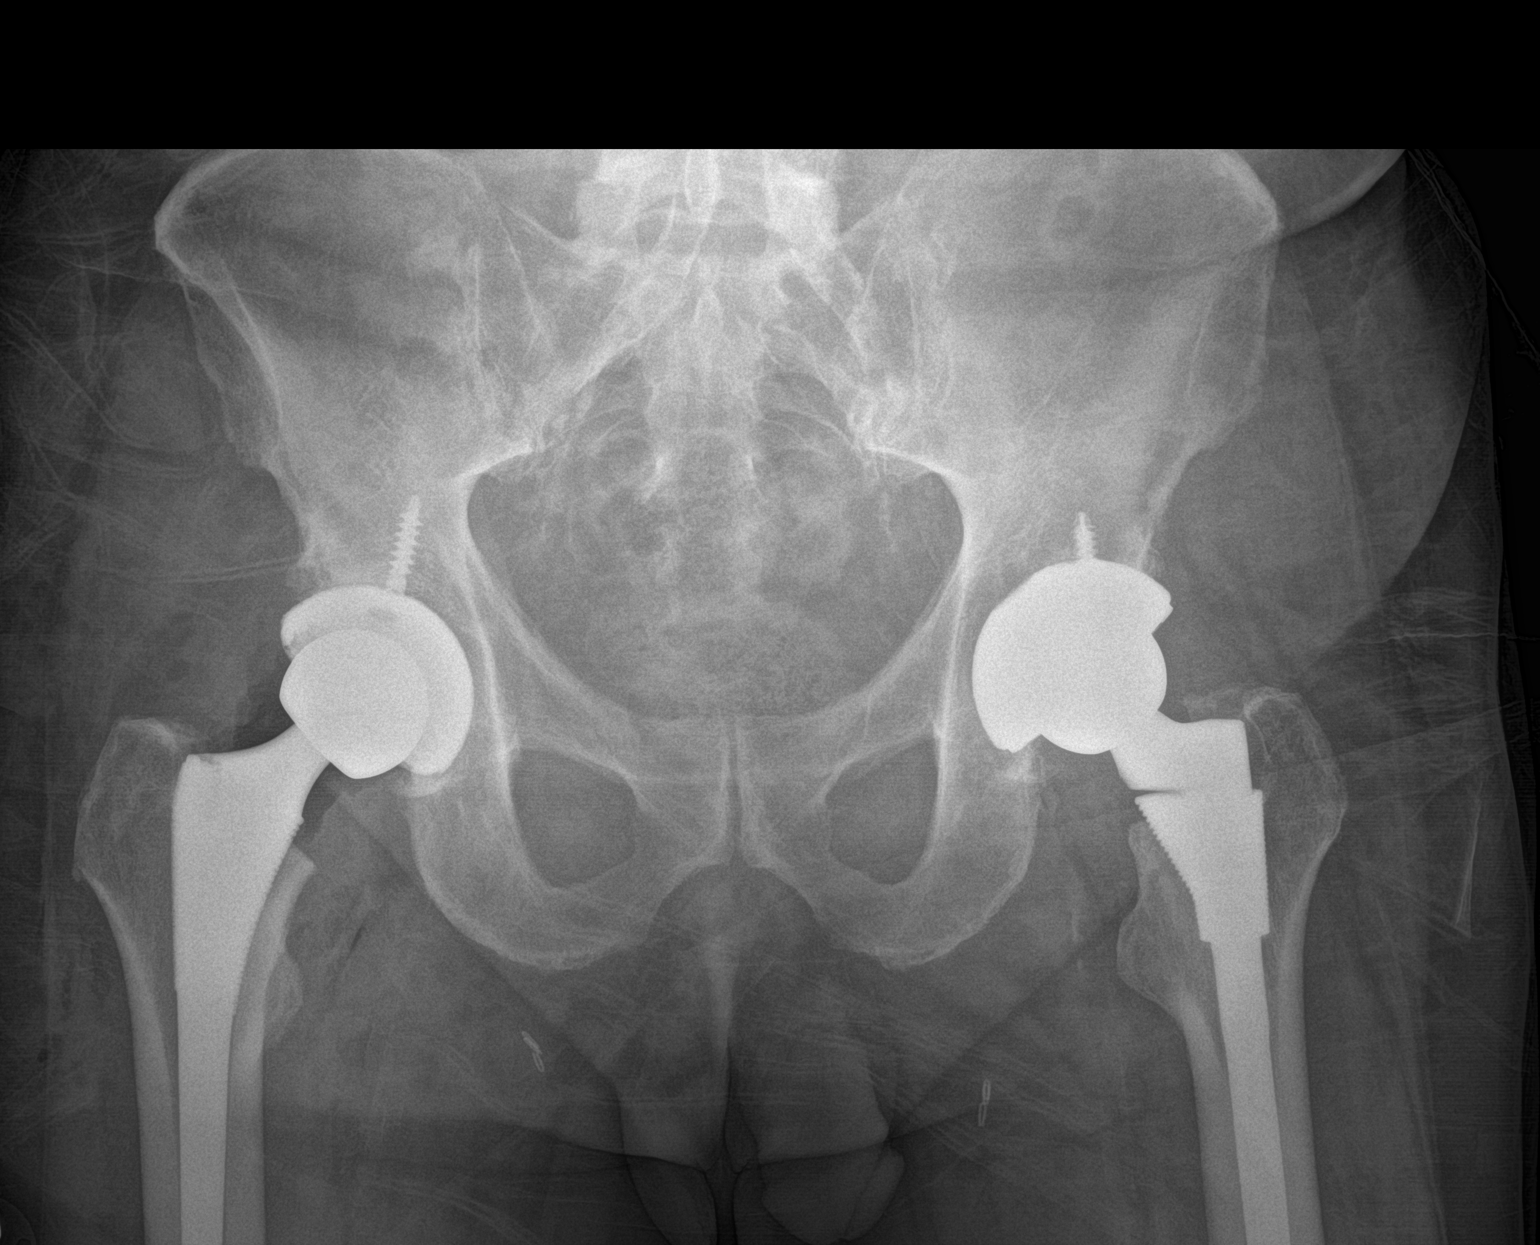

[1 of 1 positions shown; findings below may reference images not displayed]

FINDINGS: Recent right hip arthroplasty in expected alignment. No
periprosthetic lucency. Distal aspect of the femoral stem is
included on concurrent right hip exam, reported separately. Recent
postsurgical change includes air and edema in the soft tissues. Left
hip arthroplasty is intact where visualized.
IMPRESSION: Recent right hip arthroplasty in expected alignment. No immediate
postoperative complication.

## 2021-07-21 ENCOUNTER — Other Ambulatory Visit (HOSPITAL_COMMUNITY): Payer: Self-pay | Admitting: Internal Medicine

## 2021-07-21 DIAGNOSIS — Z1339 Encounter for screening examination for other mental health and behavioral disorders: Secondary | ICD-10-CM | POA: Diagnosis not present

## 2021-07-21 DIAGNOSIS — D692 Other nonthrombocytopenic purpura: Secondary | ICD-10-CM | POA: Diagnosis not present

## 2021-07-21 DIAGNOSIS — Z1212 Encounter for screening for malignant neoplasm of rectum: Secondary | ICD-10-CM | POA: Diagnosis not present

## 2021-07-21 DIAGNOSIS — M1990 Unspecified osteoarthritis, unspecified site: Secondary | ICD-10-CM | POA: Diagnosis not present

## 2021-07-21 DIAGNOSIS — Z23 Encounter for immunization: Secondary | ICD-10-CM | POA: Diagnosis not present

## 2021-07-21 DIAGNOSIS — R82998 Other abnormal findings in urine: Secondary | ICD-10-CM | POA: Diagnosis not present

## 2021-07-21 DIAGNOSIS — D638 Anemia in other chronic diseases classified elsewhere: Secondary | ICD-10-CM | POA: Diagnosis not present

## 2021-07-21 DIAGNOSIS — E663 Overweight: Secondary | ICD-10-CM | POA: Diagnosis not present

## 2021-07-21 DIAGNOSIS — Z1331 Encounter for screening for depression: Secondary | ICD-10-CM | POA: Diagnosis not present

## 2021-07-21 DIAGNOSIS — I2510 Atherosclerotic heart disease of native coronary artery without angina pectoris: Secondary | ICD-10-CM | POA: Diagnosis not present

## 2021-07-21 DIAGNOSIS — Z0000 Encounter for general adult medical examination without abnormal findings: Secondary | ICD-10-CM | POA: Diagnosis not present

## 2021-07-21 DIAGNOSIS — E7800 Pure hypercholesterolemia, unspecified: Secondary | ICD-10-CM | POA: Diagnosis not present

## 2021-07-21 DIAGNOSIS — I119 Hypertensive heart disease without heart failure: Secondary | ICD-10-CM | POA: Diagnosis not present

## 2021-07-21 DIAGNOSIS — Z96641 Presence of right artificial hip joint: Secondary | ICD-10-CM | POA: Diagnosis not present

## 2021-07-21 DIAGNOSIS — I779 Disorder of arteries and arterioles, unspecified: Secondary | ICD-10-CM | POA: Diagnosis not present

## 2021-08-17 DIAGNOSIS — R7989 Other specified abnormal findings of blood chemistry: Secondary | ICD-10-CM | POA: Diagnosis not present

## 2021-08-17 DIAGNOSIS — E7800 Pure hypercholesterolemia, unspecified: Secondary | ICD-10-CM | POA: Diagnosis not present

## 2021-08-27 ENCOUNTER — Telehealth (HOSPITAL_COMMUNITY): Payer: Self-pay | Admitting: *Deleted

## 2021-08-27 NOTE — Telephone Encounter (Signed)
Pt left vm stating he had labs drawn at Dr.Tisovec's office and he was told to contact our office about adjusting his statin.  I called Dr.Tisovec's CMA Amanda at [phone] and left vm requesting lab work for Dr.Bensimhon to review.

## 2021-09-03 ENCOUNTER — Other Ambulatory Visit (HOSPITAL_COMMUNITY): Payer: Self-pay | Admitting: Internal Medicine

## 2021-09-04 ENCOUNTER — Telehealth (HOSPITAL_COMMUNITY): Payer: Self-pay | Admitting: Pharmacy Technician

## 2021-09-04 NOTE — Telephone Encounter (Signed)
Pt called his LDL was 90 when Dr.Tisovec checked it. Dr.Tisovec wants to change pt from atorvastatin to rosuvastatin pt wanted to know if Dr.Bensimhon agrees with change and if it will really benefit him to change medications.   Routed to Dr.Bensimhon for advice

## 2021-09-04 NOTE — Telephone Encounter (Signed)
Advanced Heart Failure Patient Advocate Encounter ° °Received a notice from [employer] that it is time to renew Entresto assistance. Let voicemail for patient to call back and start the re-enrollment process.  ° °Janaiyah Blackard F Mostyn Varnell, CPhT ° ° °

## 2021-09-15 ENCOUNTER — Other Ambulatory Visit (HOSPITAL_COMMUNITY): Payer: Self-pay | Admitting: Internal Medicine

## 2021-09-15 NOTE — Telephone Encounter (Signed)
Left detailed vm °

## 2021-09-16 DIAGNOSIS — H25811 Combined forms of age-related cataract, right eye: Secondary | ICD-10-CM | POA: Diagnosis not present

## 2021-09-16 DIAGNOSIS — H21561 Pupillary abnormality, right eye: Secondary | ICD-10-CM | POA: Diagnosis not present

## 2021-09-16 DIAGNOSIS — H2511 Age-related nuclear cataract, right eye: Secondary | ICD-10-CM | POA: Diagnosis not present

## 2021-09-28 ENCOUNTER — Ambulatory Visit: Payer: Medicare HMO | Admitting: Physician Assistant

## 2021-09-28 ENCOUNTER — Ambulatory Visit (HOSPITAL_COMMUNITY)
Admission: RE | Admit: 2021-09-28 | Discharge: 2021-09-28 | Disposition: A | Discharge status: Home / Self Care | Payer: Medicare HMO | Source: Ambulatory Visit | Attending: Surgery | Admitting: Surgery

## 2021-09-28 ENCOUNTER — Other Ambulatory Visit: Payer: Self-pay

## 2021-09-28 VITALS — BP 178/64 | HR 56 | Temp 98.2°F | Resp 20 | Ht 67.0 in | Wt 191.0 lb

## 2021-09-28 DIAGNOSIS — I6523 Occlusion and stenosis of bilateral carotid arteries: Secondary | ICD-10-CM

## 2021-09-28 NOTE — Progress Notes (Signed)
HISTORY AND PHYSICAL     CC:  follow up. Requesting Provider:  Gaspar Garbeisovec, Richard W, MD  HPI: This is a 81 y.o. male here for follow up for carotid artery stenosis.  Pt is s/p left CEA for asymptomatic carotid artery stenosis on 11/21/2020 by Dr. Myra GianottiBrabham.  He had increased velocities in the CCA and was sent for CT scan for further evaluation.  This revealed a 90% left carotid stenosis.  He also had family hx of AAA and had duplex, which revealed maximum diameter of 1.9cm and this did not need further follow up.    Pt was last seen 03/23/2021 and at that time he was doing well without stroke sx.  His carotid duplex did continue to show right ICA stenosis of 60-79%.  Pt returns today for follow up.    Pt denies any amaurosis fugax, speech difficulties, weakness, numbness, paralysis or clumsiness or facial droop.  He states he does have some blurriness but recently had  he states he has some pain in his hip since having hip replacement cataract surgery.  He is scheduled to have the other eye done in a couple of weeks.   Pt has hx of CABG with GSV harvest in 2012  The pt is on a statin for cholesterol management.  The pt is on a daily aspirin.   Other AC:  none The pt is on CCB, BB, diureteic for hypertension.   The pt is not diabetic.   Tobacco hx:  never  Pt does have family hx of AAA.   He states his mom at age 79 was going into the OR to have it repaired and it ruptured at that point, but she did recover and lived another 6 years.  He has been screened and no evidence of AAA.  Past Medical History:  Diagnosis Date   Arthritis    hips   CAD (coronary artery disease)    s/p multiple PCIs to RCA dating back to [id number]. Last stent to proximal RCA in November of 2011. Also had 99% stenosis in the distal PDA with left to right collaterals and nonobstructive disease in the left coronary tree. ;  ISR of RCA 10/12 - CABG 10/9 with Dr. Zenaida NieceVan Trigt: L-LAD, S-OM, S-PDA   Carotid bruit    carotid u/s  2/12: 40-59% bilaterally. (stable)   GERD (gastroesophageal reflux disease)    HTN (hypertension)    Hyperlipidemia     Past Surgical History:  Procedure Laterality Date   CARDIAC CATHETERIZATION     COLONOSCOPY     Coronary artery bypass grafting x4 (left internal mammary artery  06/23/2011   CORONARY STENT PLACEMENT  Nov 2011   prox RCA. Has had prior multiple procedures.   CORONARY/GRAFT ANGIOGRAPHY N/A 05/04/2019   Procedure: CORONARY/GRAFT ANGIOGRAPHY;  Surgeon: Dolores PattyBensimhon, Daniel R, MD;  [facility];  Service: Cardiovascular;  Laterality: N/A;   ENDARTERECTOMY Left 11/21/2020   Procedure: LEFT CAROTID ENDARTERECTOMY;  Surgeon: Nada LibmanBrabham, Vance W, MD;  [facility]: [facility];  Service: Vascular;  Laterality: Left;   INGUINAL HERNIA REPAIR Right 06/10/2020   Procedure: RIGHT INGUINAL HERNIA;  Surgeon: Harriette Bouillonornett, Thomas, MD;  Location: [facility] OR;  Service: General;  Laterality: Right;  GENERAL AND TAP BLOCK   INSERTION OF MESH Right 06/10/2020   Procedure: INSERTION OF MESH;  Surgeon: Harriette Bouillonornett, Thomas, MD;  Location: [facility] OR;  Service: General;  Laterality: Right;   JOINT REPLACEMENT     LEFT HEART CATH AND CORS/GRAFTS ANGIOGRAPHY N/A 04/28/2020  Procedure: LEFT HEART CATH AND CORS/GRAFTS ANGIOGRAPHY;  Surgeon: Runell GessBerry, Jonathan J, MD;  [facility];  Service: Cardiovascular;  Laterality: N/A;   left total hip replacement  2008   PATCH ANGIOPLASTY Left 11/21/2020   Procedure: PATCH ANGIOPLASTY;  Surgeon: Nada LibmanBrabham, Vance W, MD;  [facility];  Service: Vascular;  Laterality: Left;   TONSILLECTOMY     TOOTH EXTRACTION  06/2013   TOTAL HIP ARTHROPLASTY Right 04/22/2020   Procedure: TOTAL HIP ARTHROPLASTY;  Surgeon: Teryl LucyLandau, Joshua, MD;  Location: [facility] OR[facility];  Service: Orthopedics;  Laterality: Right;    Allergies  Allergen Reactions   Morphine Nausea Only    Current Outpatient Medications  Medication Sig Dispense Refill   acetaminophen (TYLENOL) 325 MG tablet Take 650 mg by  mouth every 6 (six) hours as needed.     amLODipine (NORVASC) 10 MG tablet TAKE 1 TABLET ONCE DAILY. 90 tablet 3   aspirin 81 MG chewable tablet Chew 81 mg by mouth every evening.     atorvastatin (LIPITOR) 80 MG tablet TAKE ONE TABLET BY MOUTH DAILY 90 tablet 3   dapagliflozin propanediol (FARXIGA) 10 MG TABS tablet Take 1 tablet (10 mg total) by mouth daily before breakfast. 90 tablet 3   ENTRESTO 97-103 MG TAKE 1 TABLET BY MOUTH TWO TIMES A DAY 180 tablet 3   ezetimibe (ZETIA) 10 MG tablet TAKE 1 TABLET BY MOUTH DAILY. 90 tablet 3   metoprolol succinate (TOPROL-XL) 25 MG 24 hr tablet TAKE 1 TABLET ONCE DAILY. 90 tablet 3   nitroGLYCERIN (NITROSTAT) 0.4 MG SL tablet 1 TAB UNDER TONGUE AS NEEDED FOR CHEST PAIN. MAY REPEAT EVERY 5 MIN FOR A TOTAL OF 3 DOSES. 25 tablet 0   pantoprazole (PROTONIX) 40 MG tablet TAKE 1 TABLET ONCE DAILY. 90 tablet 3   spironolactone (ALDACTONE) 25 MG tablet TAKE 1 TABLET ONCE DAILY. 90 tablet 3   Tamsulosin HCl (FLOMAX) 0.4 MG CAPS Take 0.8 mg by mouth at bedtime.      No current facility-administered medications for this visit.    Family History  Problem Relation Age of Onset   Heart disease Father    Colon cancer Neg Hx    Stomach cancer Neg Hx     Social History   Socioeconomic History   Marital status: Married    Spouse name: Not on file   Number of children: Not on file   Years of education: Not on file   Highest education level: Not on file  Occupational History   Not on file  Tobacco Use   Smoking status: Never   Smokeless tobacco: Never  Vaping Use   Vaping Use: Never used  Substance and Sexual Activity   Alcohol use: Yes    Alcohol/week: 6.0 standard drinks    Types: 6 Cans of beer per week    Comment: weekly   Drug use: No   Sexual activity: Not on file  Other Topics Concern   Not on file  Social History Narrative   Not on file   Social Determinants of Health   Financial Resource Strain: Not on file  Food Insecurity: Not on  file  Transportation Needs: Not on file  Physical Activity: Not on file  Stress: Not on file  Social Connections: Not on file  Intimate Partner Violence: Not on file     REVIEW OF SYSTEMS:   [X]  denotes positive finding, [ ]  denotes negative finding Cardiac  Comments:  Chest pain or chest pressure:  Shortness of breath upon exertion:    Short of breath when lying flat:    Irregular heart rhythm:        Vascular    Pain in calf, thigh, or hip brought on by ambulation: x See HPI  Pain in feet at night that wakes you up from your sleep:     Blood clot in your veins:    Leg swelling:         Pulmonary    Oxygen at home:    Productive cough:  x Getting over a cold  Wheezing:         Neurologic    Sudden weakness in arms or legs:     Sudden numbness in arms or legs:     Sudden onset of difficulty speaking or slurred speech:    Temporary loss of vision in one eye:     Problems with dizziness:         Gastrointestinal    Blood in stool:     Vomited blood:         Genitourinary    Burning when urinating:     Blood in urine:        Psychiatric    Major depression:         Hematologic    Bleeding problems:    Problems with blood clotting too easily:        Skin    Rashes or ulcers:        Constitutional    Fever or chills:      PHYSICAL EXAMINATION:  Today's Vitals   09/28/21 0853 09/28/21 0854  BP: (!) 159/54 (!) 178/64  Pulse: (!) 56   Resp: 20   Temp: 98.2 F (36.8 C)   TempSrc: Temporal   SpO2: 98%   Weight: 191 lb (86.6 kg)   Height: 5\' 7"  (1.702 m)    Body mass index is 29.91 kg/m.   General:  WDWN in NAD; vital signs documented above Gait: Not observed HENT: WNL, normocephalic Pulmonary: normal non-labored breathing Cardiac: regular HR, with carotid bruits bilaterally Skin: without rashes Vascular Exam/Pulses: 1+ DP pulses bilaterally Extremities: without ischemic changes, without Gangrene , without cellulitis; without open  wounds Musculoskeletal: no muscle wasting or atrophy  Neurologic: A&O X 3; moving all extremities equally; speech is fluent/normal Psychiatric:  The pt has Normal affect.   Non-Invasive Vascular Imaging:   Carotid Duplex on 09/28/2021: Right:  40-59% ICA stenosis Left:  1-39% ICA stenosis Vertebrals:  Bilateral vertebral arteries demonstrate antegrade flow.  Subclavians: Right subclavian artery flow was disturbed. Normal flow hemodynamics were seen in the left subclavian artery.   Previous Carotid duplex on 03/23/2021: Right: 60-79% ICA stenosis Left:   normal    ASSESSMENT/PLAN:: 81 y.o. male here for follow up carotid artery stenosis and is s/p left CEA for asymptomatic carotid artery stenosis on 11/21/2020 by Dr. 01/21/2021.  -duplex today reveals decreased EDV of the right ICA stenosis, however, the PSV is increased at 402cm/s.  Pt remains asymptomatic.  He did have a CTA head/neck last year prior to the left CEA.  Dr. Myra Gianotti reviewed his u/s today and his previous CTA and discussed with pt that given the above, he feels we should proceed with right endarterectomy.  Pt does have cataract surgery in the next couple of weeks.  Dr. Myra Gianotti has asked the pt to return in 6 weeks to see him to further discuss surgery.  Pt is in agreement with this plan.   -  pt will call sooner should they have any issues. -continue statin/asa   Doreatha Massed, [facility] [phone]  Clinic MD:  Myra Gianotti

## 2021-10-06 ENCOUNTER — Other Ambulatory Visit (HOSPITAL_COMMUNITY): Payer: Self-pay

## 2021-10-06 ENCOUNTER — Other Ambulatory Visit (HOSPITAL_COMMUNITY): Payer: Self-pay | Admitting: *Deleted

## 2021-10-06 MED ORDER — ENTRESTO 97-103 MG PO TABS: 1.0000 | ORAL_TABLET | Freq: Two times a day (BID) | ORAL | 3 refills | Status: DC

## 2021-10-08 ENCOUNTER — Encounter (HOSPITAL_COMMUNITY): Payer: Self-pay | Admitting: Internal Medicine

## 2021-10-08 ENCOUNTER — Ambulatory Visit (HOSPITAL_COMMUNITY)
Admission: RE | Admit: 2021-10-08 | Discharge: 2021-10-08 | Disposition: A | Discharge status: Home / Self Care | Payer: Medicare HMO | Source: Ambulatory Visit | Attending: Internal Medicine | Admitting: Internal Medicine

## 2021-10-08 ENCOUNTER — Other Ambulatory Visit: Payer: Self-pay

## 2021-10-08 VITALS — BP 178/60 | HR 67 | Wt 186.8 lb

## 2021-10-08 DIAGNOSIS — I10 Essential (primary) hypertension: Secondary | ICD-10-CM

## 2021-10-08 DIAGNOSIS — I5020 Unspecified systolic (congestive) heart failure: Secondary | ICD-10-CM | POA: Diagnosis not present

## 2021-10-08 DIAGNOSIS — Z951 Presence of aortocoronary bypass graft: Secondary | ICD-10-CM | POA: Diagnosis not present

## 2021-10-08 DIAGNOSIS — Z7984 Long term (current) use of oral hypoglycemic drugs: Secondary | ICD-10-CM | POA: Diagnosis not present

## 2021-10-08 DIAGNOSIS — I255 Ischemic cardiomyopathy: Secondary | ICD-10-CM | POA: Diagnosis not present

## 2021-10-08 DIAGNOSIS — E785 Hyperlipidemia, unspecified: Secondary | ICD-10-CM | POA: Diagnosis not present

## 2021-10-08 DIAGNOSIS — I5022 Chronic systolic (congestive) heart failure: Secondary | ICD-10-CM | POA: Diagnosis not present

## 2021-10-08 DIAGNOSIS — I6522 Occlusion and stenosis of left carotid artery: Secondary | ICD-10-CM | POA: Diagnosis not present

## 2021-10-08 DIAGNOSIS — E7849 Other hyperlipidemia: Secondary | ICD-10-CM

## 2021-10-08 DIAGNOSIS — I2510 Atherosclerotic heart disease of native coronary artery without angina pectoris: Secondary | ICD-10-CM | POA: Insufficient documentation

## 2021-10-08 DIAGNOSIS — Z7901 Long term (current) use of anticoagulants: Secondary | ICD-10-CM | POA: Insufficient documentation

## 2021-10-08 DIAGNOSIS — I110 Hypertensive heart disease with heart failure: Secondary | ICD-10-CM | POA: Insufficient documentation

## 2021-10-08 DIAGNOSIS — Z7982 Long term (current) use of aspirin: Secondary | ICD-10-CM | POA: Diagnosis not present

## 2021-10-08 DIAGNOSIS — Z79899 Other long term (current) drug therapy: Secondary | ICD-10-CM | POA: Insufficient documentation

## 2021-10-08 LAB — COMPREHENSIVE METABOLIC PANEL
ALT: 18 U/L (ref 0–44)
AST: 19 U/L (ref 15–41)
Albumin: 3.7 g/dL (ref 3.5–5.0)
Alkaline Phosphatase: 41 U/L (ref 38–126)
Anion gap: 5 (ref 5–15)
BUN: 24 mg/dL — ABNORMAL HIGH (ref 8–23)
CO2: 25 mmol/L (ref 22–32)
Calcium: 8.9 mg/dL (ref 8.9–10.3)
Chloride: 108 mmol/L (ref 98–111)
Creatinine, Ser: 0.94 mg/dL (ref 0.61–1.24)
GFR, Estimated: >60 mL/min (ref >60)
Glucose, Bld: 100 mg/dL — ABNORMAL HIGH (ref 70–99)
Potassium: 4.4 mmol/L (ref 3.5–5.1)
Sodium: 138 mmol/L (ref 135–145)
Total Bilirubin: 1.2 mg/dL (ref 0.3–1.2)
Total Protein: 6.6 g/dL (ref 6.5–8.1)

## 2021-10-08 LAB — LIPID PANEL
Cholesterol: 140 mg/dL (ref 0–200)
HDL: 36 mg/dL — ABNORMAL LOW (ref >40)
LDL Cholesterol: 91 mg/dL (ref 0–99)
Total CHOL/HDL Ratio: 3.9 RATIO
Triglycerides: 65 mg/dL (ref <150)
VLDL: 13 mg/dL (ref 0–40)

## 2021-10-08 MED ORDER — DOXAZOSIN MESYLATE 2 MG PO TABS: 2.0000 mg | ORAL_TABLET | Freq: Every day | ORAL | 11 refills | Status: DC

## 2021-10-08 NOTE — Addendum Note (Signed)
Encounter addended by: Noralee Space, RN on: 10/08/2021 11:39 AM  Actions taken: Visit diagnoses modified, Diagnosis association updated, Medication long-term status modified, Pharmacy for encounter modified, Order list changed, Clinical Note Signed, Charge Capture section accepted

## 2021-10-08 NOTE — Progress Notes (Signed)
Advanced Heart Failure Clinic Note   Date:  10/08/2021   ID:  [name], DOB 09/18/1940, MRN [id number]  Location: Home  Provider location: [place] [facility] Type of Visit: Established patient  PCP:  Tisovec, Adelfa Kohichard W, MD  Cardiologist:  None Primary HF: Mahina Salatino  Chief Complaint: Heart Failure    History of Present Illness:  [name] is a 81 y.o. male with history of CAD, multiple angioplasties to the RCA dating back to 1983 followed by CABG in 2012, HTN, and hyperlipidemia.  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    He was hospitalized in 11/11 because of recurrent chest pain. He underwent drug-eluting stent to the proximal RCA July 24, 2010. Ejection fraction was 65%. He also had 99% stenosis in the distal PDA with left to right collaterals and nonobstructive disease in the left coronary tree.   [place] in [place]. Found to have progressive CAD. Underwent Coronary artery bypass grafting x4 (left internal mammary artery to LAD, saphenous vein graft to circumflex marginal, saphenous vein graft to posterior descending branch of the distal right coronary, saphenous vein graft to the posterolateral branch of the distal right coronary artery).   Had recurrent CP in 8/21 underwent cath which showed stable anatomy no targets for PCI  1. 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 LAD 100% 3. RCA 100% 4. LCX 50% in mid LCX 5. LIMA to LAD widely patent 6. SVG to R PDA widely patent 7. SVG to OM occluded ostially 8. SVG to PL occluded ostially Has moderate disease in anomalous LCX but not crittcal and anatomy precludes PCI. 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 He was discharged 04/23/20. On 04/25/20 [facility] HS Trop 111-->1421 -> 17,390. It was unclear if this was demand ischemia. Had LHC on 04/28/20 with no targets for  revascularization and medical management. ECHO showed EF 40-45%.  Echo 7/22 EF 50-55%  Carotid u/s RICA 40-59% L ICA 1-39% Reviewed by Dr. Myra GianottiBrabham who feels RICA is tight and needs revascularization   Today he returns for HF follow up. Going to [place] 3x/week. Walking on TM, doing exercise bike and sit-ups. Denies CP or SOB. No edema, orthopnea or PND. Pending cataract surgery.    Past Medical History:  Diagnosis Date   Arthritis    hips   CAD (coronary artery disease)    s/p multiple PCIs to RCA dating back to 591983. Last stent to proximal RCA in November of 2011. Also had 99% stenosis in the distal PDA with left to right collaterals and nonobstructive disease in the left coronary tree. ;  ISR of RCA 10/12 - CABG 10/9 with Dr. Zenaida NieceVan Trigt: L-LAD, S-OM, S-PDA   Carotid bruit    carotid u/s 2/12: 40-59% bilaterally. (stable)   GERD (gastroesophageal reflux disease)    HTN (hypertension)    Hyperlipidemia    Past Surgical History:  Procedure Laterality Date   CARDIAC CATHETERIZATION     COLONOSCOPY     Coronary artery bypass grafting x4 (left internal mammary artery  06/23/2011   CORONARY STENT PLACEMENT  Nov 2011   prox RCA. Has had prior multiple procedures.   CORONARY/GRAFT ANGIOGRAPHY N/A 05/04/2019   Procedure: CORONARY/GRAFT ANGIOGRAPHY;  Surgeon: Dolores PattyBensimhon, Shun Pletz R, MD;  [facility];  Service: Cardiovascular;  Laterality:  N/A;   ENDARTERECTOMY Left 11/21/2020   Procedure: LEFT CAROTID ENDARTERECTOMY;  Surgeon: Nada Libman, MD;  [facility];  Service: Vascular;  Laterality: Left;   INGUINAL HERNIA REPAIR Right 06/10/2020   Procedure: RIGHT INGUINAL HERNIA;  Surgeon: Harriette Bouillon, MD;  Location: [facility] OR;  Service: General;  Laterality: Right;  GENERAL AND TAP BLOCK   INSERTION OF MESH Right 06/10/2020   Procedure: INSERTION OF MESH;  Surgeon: Harriette Bouillon, MD;  Location: [facility] OR;  Service: General;  Laterality: Right;   JOINT REPLACEMENT     LEFT HEART CATH  AND CORS/GRAFTS ANGIOGRAPHY N/A 04/28/2020   Procedure: LEFT HEART CATH AND CORS/GRAFTS ANGIOGRAPHY;  Surgeon: Runell Gess, MD;  [facility];  Service: Cardiovascular;  Laterality: N/A;   left total hip replacement  2008   PATCH ANGIOPLASTY Left 11/21/2020   Procedure: PATCH ANGIOPLASTY;  Surgeon: Nada Libman, MD;  Location[facility] OR;  Service: Vascular;  Laterality: Left;   TONSILLECTOMY     TOOTH EXTRACTION  06/2013   TOTAL HIP ARTHROPLASTY Right 04/22/2020   Procedure: TOTAL HIP ARTHROPLASTY;  Surgeon: Teryl Lucy, MD;  Location: [facility]S;  Service: Orthopedics;  Laterality: Right;     Current Outpatient Medications  Medication Sig Dispense Refill   acetaminophen (TYLENOL) 325 MG tablet Take 650 mg by mouth every 6 (six) hours as needed.     amLODipine (NORVASC) 10 MG tablet TAKE 1 TABLET ONCE DAILY. 90 tablet 3   aspirin 81 MG chewable tablet Chew 81 mg by mouth every evening.     dapagliflozin propanediol (FARXIGA) 10 MG TABS tablet Take 1 tablet (10 mg total) by mouth daily before breakfast. 90 tablet 3   ezetimibe (ZETIA) 10 MG tablet TAKE 1 TABLET BY MOUTH DAILY. 90 tablet 3   metoprolol succinate (TOPROL-XL) 25 MG 24 hr tablet TAKE 1 TABLET ONCE DAILY. 90 tablet 3   nitroGLYCERIN (NITROSTAT) 0.4 MG SL tablet 1 TAB UNDER TONGUE AS NEEDED FOR CHEST PAIN. MAY REPEAT EVERY 5 MIN FOR A TOTAL OF 3 DOSES. 25 tablet 0   pantoprazole (PROTONIX) 40 MG tablet TAKE 1 TABLET ONCE DAILY. 90 tablet 3   rosuvastatin (CRESTOR) 40 MG tablet Take 1 tablet by mouth daily.     sacubitril-valsartan (ENTRESTO) 97-103 MG Take 1 tablet by mouth 2 (two) times daily. 180 tablet 3   spironolactone (ALDACTONE) 25 MG tablet TAKE 1 TABLET ONCE DAILY. 90 tablet 3   Tamsulosin HCl (FLOMAX) 0.4 MG CAPS Take 0.8 mg by mouth at bedtime.      No current facility-administered medications for this encounter.    Allergies:   Morphine   Social History:  The patient  reports that he has never  smoked. He has never used smokeless tobacco. He reports current alcohol use of about 6.0 standard drinks per week. He reports that he does not use drugs.   Family History:  The patient's family history includes Heart disease in his father.   ROS:  Please see the history of present illness.   All other systems are personally reviewed and negative.   Vitals:   10/08/21 1040  BP: (!) 178/60  Pulse: 67  SpO2: 98%   Wt Readings from Last 3 Encounters:  10/08/21 84.7 kg (186 lb 12.8 oz)  09/28/21 86.6 kg (191 lb)  03/23/21 84.8 kg (187 lb)     Exam:   General:  Well appearing. No resp difficulty HEENT: normal Neck: supple. no JVD. Carotids 2+ bilat; +  bruits. No lymphadenopathy or thryomegaly appreciated. Cor: PMI nondisplaced. Regular rate & rhythm. No rubs, gallops or murmurs. Lungs: clear Abdomen: soft, nontender, nondistended. No hepatosplenomegaly. No bruits or masses. Good bowel sounds. Extremities: no cyanosis, clubbing, rash, edema Neuro: alert & orientedx3, cranial nerves grossly intact. moves all 4 extremities w/o difficulty. Affect pleasant  ECG Sinus 64 No ST-T wave abnormalities. Personally reviewed   Recent Labs: 11/20/2020: ALT 28 11/22/2020: BUN 16; Creatinine, Ser 0.85; Hemoglobin 11.7; Platelets 208; Potassium 4.0; Sodium 135  Personally reviewed   Wt Readings from Last 3 Encounters:  10/08/21 84.7 kg (186 lb 12.8 oz)  09/28/21 86.6 kg (191 lb)  03/23/21 84.8 kg (187 lb)    ASSESSMENT AND PLAN:  1. CAD S/p CABG 2012 - Cath 04/2020 with stable revascularization. - No s/s angina - Continue statin and ASA.  2. 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  - Echo 7/22 EF 55-60% (probably closer to 50-55% by my read) - NYHA I  Volume status ok   - Cotninue Entresto 97/103 mg bid. - Continue spiro 25 mg daily. - Continue Toprol XL 25 mg daily. Heart rate limits titration.  - Continue Farxiga 10 mg daily.  3. HTN - Home BP readings  have had SBP 140 range - Higher in the office.  - Will switch Flomax to doxazosin 2mg  daily  4. Carotid stenosis - Stable 40-59% disease bilateral carotids. CCA with >50% hemodynamically significant plaque.  - CT scan (2/22) shows a 90% left carotid stenosis.  - L CEA 11/2020  - Carotid u/s 1/23  R 1-39% L 40-59% -> pending possible R CEA with Dr. 2/23   5. HLD - Continue crestor 40 (recently switched from atorva 80) mg daily + Zetia. - LDL 66 in 8/21. Followed by Dr. 9/21. - LDL recently was 90 by his report - Repeat lipids if LDL not < 70. Consider PCSK-9i   Wylene Simmer, MD  10/08/2021 11:07 AM  [facility] [address] Heart and Vascular [place] [phone] (office) [phone] (fax)

## 2021-10-08 NOTE — Patient Instructions (Signed)
Stop Flomax  Start Doxazosin 2 mg Daily  **IF you notice any change/difficulty with urination please let us know  Your physician recommends that you schedule a follow-up appointment in: 9 months (October 2023), **PLEASE CALL OUR OFFICE IN AUGUST TO SCHEDULE THIS APPOINTMENT  If you have any questions or concerns before your next appointment please send [place] a message through [place] or call our office at [phone].    TO LEAVE A MESSAGE FOR THE NURSE SELECT OPTION 2, PLEASE LEAVE A MESSAGE INCLUDING: YOUR NAME DATE OF BIRTH CALL BACK NUMBER REASON FOR CALL**this is important as we prioritize the call backs  YOU WILL RECEIVE A CALL BACK THE SAME DAY AS LONG AS YOU CALL BEFORE 4:00 PM  At the [facility], you and your health needs are our priority. As part of our continuing mission to provide you with exceptional heart care, we have created designated Provider Care Teams. These Care Teams include your primary Cardiologist (physician) and Advanced Practice Providers (APPs- Physician Assistants and Nurse Practitioners) who all work together to provide you with the care you need, when you need it.   You may see any of the following providers on your designated Care Team at your next follow up: Dr Arvilla Meres Dr Carron Curie, NP Robbie Lis, [place] [facility] [place], [place] Karle Plumber, PharmD   Please be sure to bring in all your medications bottles to every appointment.

## 2021-10-12 ENCOUNTER — Other Ambulatory Visit (HOSPITAL_COMMUNITY): Payer: Self-pay | Admitting: Cardiology

## 2021-10-12 DIAGNOSIS — E7849 Other hyperlipidemia: Secondary | ICD-10-CM

## 2021-10-12 NOTE — Progress Notes (Signed)
Order for lipid clinic places

## 2021-11-04 DIAGNOSIS — H21562 Pupillary abnormality, left eye: Secondary | ICD-10-CM | POA: Diagnosis not present

## 2021-11-04 DIAGNOSIS — H2512 Age-related nuclear cataract, left eye: Secondary | ICD-10-CM | POA: Diagnosis not present

## 2021-11-04 DIAGNOSIS — H25812 Combined forms of age-related cataract, left eye: Secondary | ICD-10-CM | POA: Diagnosis not present

## 2021-11-05 ENCOUNTER — Other Ambulatory Visit (HOSPITAL_COMMUNITY): Payer: Self-pay | Admitting: Internal Medicine

## 2021-11-05 ENCOUNTER — Ambulatory Visit: Payer: Medicare HMO

## 2021-11-05 ENCOUNTER — Ambulatory Visit (HOSPITAL_COMMUNITY)
Admission: RE | Admit: 2021-11-05 | Discharge: 2021-11-05 | Disposition: A | Discharge status: Home / Self Care | Payer: Medicare HMO | Source: Ambulatory Visit | Attending: Cardiology | Admitting: Cardiology

## 2021-11-05 DIAGNOSIS — I493 Ventricular premature depolarization: Secondary | ICD-10-CM

## 2021-11-05 NOTE — Progress Notes (Signed)
Zio patch placed onto patient.  All instructions and information reviewed with patient, they verbalize understanding with no questions. 

## 2021-11-09 ENCOUNTER — Ambulatory Visit: Payer: Medicare HMO | Admitting: Surgery

## 2021-11-16 ENCOUNTER — Ambulatory Visit: Payer: Medicare HMO | Admitting: Surgery

## 2021-11-23 ENCOUNTER — Other Ambulatory Visit: Payer: Self-pay

## 2021-11-23 ENCOUNTER — Encounter: Payer: Self-pay | Admitting: Surgery

## 2021-11-23 ENCOUNTER — Ambulatory Visit: Payer: Medicare HMO | Admitting: Surgery

## 2021-11-23 VITALS — BP 159/62 | HR 62 | Temp 98.6°F | Resp 20 | Ht 67.0 in | Wt 193.8 lb

## 2021-11-23 DIAGNOSIS — I6523 Occlusion and stenosis of bilateral carotid arteries: Secondary | ICD-10-CM | POA: Diagnosis not present

## 2021-11-23 NOTE — Progress Notes (Signed)
? ?[facility] [place] ? ?Patient name: [name] MRN: [id number] DOB: 07-27-1941 Sex: male ? ? ?REASON FOR VISIT:  ? ? ?Follow up ? ?HISOTRY OF PRESENT ILLNESS:  ? ? ?[name] is a 81 y.o. male who is back today for follow-up of his carotid disease.  He is status post left carotid endarterectomy on 11/21/2020 for asymptomatic stenosis.  I have been following his right carotid disease which is progressed to greater than 80% by duplex.  He remains asymptomatic. ? ?Patient with history of coronary artery disease, status post CABG as well as PCI.  He is medically managed for hypertension.  He is on a statin for hypercholesterolemia.  He is also treated for congestive heart failure. ?PAST MEDICAL HISTORY:  ? ?Past Medical History:  ?Diagnosis Date  ? 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 CAD (coronary artery disease)   ? s/p multiple PCIs to RCA dating back to 70. Last stent to proximal RCA in November of 2011. Also had 99% stenosis in the distal PDA with left to right collaterals and nonobstructive disease in the left coronary tree. ;  ISR of RCA 10/12 - CABG 10/9 with Dr. Donata Clay: L-LAD, S-OM, S-PDA  ? Carotid bruit   ? carotid u/s 2/12: 40-59% bilaterally. (stable)  ? GERD (gastroesophageal reflux disease)   ? HTN (hypertension)   ? Hyperlipidemia   ? ? ? ?FAMILY HISTORY:  ? ?Family History  ?Problem Relation Age of Onset  ? Heart disease Father   ? Colon cancer Neg Hx   ? Stomach cancer Neg Hx   ? ? ?SOCIAL HISTORY:  ? ?Social History  ? ?Tobacco Use  ? Smoking status: Never  ? Smokeless tobacco: Never  ?Substance Use Topics  ? Alcohol use: Yes  ?  Alcohol/week: 6.0 standard drinks  ?  Types: 6 Cans of beer per week  ?  Comment: weekly  ? ? ? ?ALLERGIES:  ? ?Allergies  ?Allergen Reactions  ? Morphine Nausea Only  ? ? ? ?CURRENT MEDICATIONS:  ? ?Current Outpatient Medications  ?Medication Sig Dispense Refill  ? acetaminophen (TYLENOL) 325 MG tablet Take 650  mg by mouth every 6 (six) hours as needed.    ? amLODipine (NORVASC) 10 MG tablet TAKE 1 TABLET ONCE DAILY. 90 tablet 3  ? aspirin 81 MG chewable tablet Chew 81 mg by mouth every evening.    ? dapagliflozin propanediol (FARXIGA) 10 MG TABS tablet Take 1 tablet (10 mg total) by mouth daily before breakfast. 90 tablet 3  ? doxazosin (CARDURA) 2 MG tablet Take 1 tablet (2 mg total) by mouth daily. 30 tablet 11  ? ezetimibe (ZETIA) 10 MG tablet TAKE 1 TABLET BY MOUTH DAILY. 90 tablet 3  ? metoprolol succinate (TOPROL-XL) 25 MG 24 hr tablet TAKE 1 TABLET ONCE DAILY. 90 tablet 3  ? nitroGLYCERIN (NITROSTAT) 0.4 MG SL tablet 1 TAB UNDER TONGUE AS NEEDED FOR CHEST PAIN. MAY REPEAT EVERY 5 MIN FOR A TOTAL OF 3 DOSES. 25 tablet 0  ? pantoprazole (PROTONIX) 40 MG tablet TAKE 1 TABLET ONCE DAILY. 90 tablet 3  ? rosuvastatin (CRESTOR) 40 MG tablet Take 1 tablet by mouth daily.    ? sacubitril-valsartan (ENTRESTO) 97-103 MG Take 1 tablet by mouth 2 (two) times daily. 180 tablet 3  ? spironolactone (ALDACTONE) 25 MG tablet TAKE 1 TABLET ONCE DAILY. 90 tablet 3  ? ?No current facility-administered medications for this visit.  ? ? ?REVIEW OF  SYSTEMS:  ? ?[X]  denotes positive finding, [ ]  denotes negative finding ?Cardiac  Comments:  ?Chest pain or chest pressure:    ?Shortness of breath upon exertion:    ?Short of breath when lying flat:    ?Irregular heart rhythm:    ?    ?Vascular    ?Pain in calf, thigh, or hip brought on by ambulation:    ?Pain in feet at night that wakes you up from your sleep:     ?Blood clot in your veins:    ?Leg swelling:     ?    ?Pulmonary    ?Oxygen at home:    ?Productive cough:     ?Wheezing:     ?    ?Neurologic    ?Sudden weakness in arms or legs:     ?Sudden numbness in arms or legs:     ?Sudden onset of difficulty speaking or slurred speech:    ?Temporary loss of vision in one eye:     ?Problems with dizziness:     ?    ?Gastrointestinal    ?Blood in stool:     ?Vomited blood:     ?     ?Genitourinary    ?Burning when urinating:     ?Blood in urine:    ?    ?Psychiatric    ?Major depression:     ?    ?Hematologic    ?Bleeding problems:    ?Problems with blood clotting too easily:    ?    ?Skin    ?Rashes or ulcers:    ?    ?Constitutional    ?Fever or chills:    ? ? ?PHYSICAL EXAM:  ? ?Vitals:  ? 11/23/21 0930 11/23/21 0933  ?BP: (!) 154/59 (!) 159/62  ?Pulse: 62   ?Resp: 20   ?Temp: 98.6 ?F (37 ?C)   ?SpO2: 98%   ?Weight: 193 lb 12.8 oz (87.9 kg)   ?Height: 5\' 7"  (1.702 m)   ? ? ?GENERAL: The patient is a well-nourished male, in no acute distress. The vital signs are documented above. ?CARDIAC: There is a regular rate and rhythm.  ?PULMONARY: Non-labored respirations ?MUSCULOSKELETAL: There are no major deformities or cyanosis. ?NEUROLOGIC: No focal weakness or paresthesias are detected. ?SKIN: There are no ulcers or rashes noted. ?PSYCHIATRIC: The patient has a normal affect. ? ?STUDIES:  ? ?I have reviewed the following: ?Right Carotid: Velocities in the right ICA are consistent with a 40-59%  ?               stenosis. The ECA appears >50% stenosed. Unable to obtain  ?higher  ?               end diastolic velocity as found on previous exam.  ? ?Left Carotid: Velocities in the left ICA are consistent with a 1-39%  ?stenosis.  ?              Patent CEA.  ? ?Vertebrals:  Bilateral vertebral arteries demonstrate antegrade flow.  ?Subclavians: Right subclavian artery flow was disturbed. Normal flow  ?             hemodynamics were seen in the left subclavian artery ? ?MEDICAL ISSUES:  ? ?Asymptomatic right carotid stenosis: I discussed with the patient that his CT scan from a year ago shows a 75% stenosis on the right with soft plaque.  His most recent ultrasound showed velocities consistent with 40-59%, however the peak systolic velocities had increased..Marland Kitchen  I suspect that the ultrasound is underestimating the degree of stenosis, however he likely is not greater than 80% which would be the threshold  to treat him.  Therefore, we have elected to continue to observe this.  He will follow-up in 6 months with repeat ultrasound ? ? ? ?[name], IV, MD, FACS ?[facility] ?Tel [phone] ?Pager [phone]  ?

## 2021-11-25 ENCOUNTER — Other Ambulatory Visit: Payer: Self-pay | Admitting: *Deleted

## 2021-11-25 DIAGNOSIS — I6523 Occlusion and stenosis of bilateral carotid arteries: Secondary | ICD-10-CM

## 2021-11-27 ENCOUNTER — Other Ambulatory Visit: Payer: Self-pay

## 2021-11-27 ENCOUNTER — Ambulatory Visit (INDEPENDENT_AMBULATORY_CARE_PROVIDER_SITE_OTHER): Payer: Medicare HMO | Admitting: Pharmacist

## 2021-11-27 DIAGNOSIS — E7849 Other hyperlipidemia: Secondary | ICD-10-CM | POA: Diagnosis not present

## 2021-11-27 NOTE — Progress Notes (Signed)
Patient ID: [name]                 DOB: Dec 12, 1940                    MRN: [id number] ? ? ? ? ?HPI: ?[name] MITZEL [name] is a 81 y.o. male patient referred to lipid clinic by Dr. Gala Romney. PMH is significant for CAD, multiple angioplasties to the RCA dating back to 1983 followed by CABG in 2012, HTN, and hyperlipidemia. LDL-C previously 61. Increased to 91 on recent labs. ? ?Patient presents today to lipid clinic. He states that he had hip surgery in Jan 2022. He could not exercise for a while. Then he got back into exercising but had a pause because of his eye procedure and taking care of his wife. Is back to exercising pretty regular.  ? ?Current Medications: rosuvastatin 40mg  daily, ezetimibe 10mg  daily ?Intolerances: none ?Risk Factors: progressive ASCVD ?LDL goal: <55 ? ?Diet: not discussed today ? ?Ex[place]  [place] Walking on [place], doing exercise bike and sit-ups.  ? ?Family History: The patient's family history includes Heart disease in his father ? ?Social History: The patient  reports that he has never smoked. He has never used smokeless tobacco. He reports current alcohol use of about 6.0 standard drinks per week. He reports that he does not use drugs.  ? ?Labs: 10/08/21 TC 140 TG 65 HDL 36 LDL-C 91 (rosuvastatin 40mg  daily, ezetimibe 10mg  daily) ? ?Past Medical History:  ?Diagnosis Date  ? 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 CAD (coronary artery disease)   ? s/p multiple PCIs to RCA dating back to 49. Last stent to proximal RCA in November of 2011. Also had 99% stenosis in the distal PDA with left to right collaterals and nonobstructive disease in the left coronary tree. ;  ISR of RCA 10/12 - CABG 10/9 with Dr. 0: L-LAD, S-OM, S-PDA  ? Carotid bruit   ? carotid u/s 2/12: 40-59% bilaterally. (stable)  ? GERD (gastroesophageal reflux disease)   ? HTN (hypertension)   ? Hyperlipidemia   ? ? ?Current Outpatient Medications on File Prior to Visit  ?Medication Sig Dispense Refill  ? acetaminophen  (TYLENOL) 325 MG tablet Take 650 mg by mouth every 6 (six) hours as needed.    ? amLODipine (NORVASC) 10 MG tablet TAKE 1 TABLET ONCE DAILY. 90 tablet 3  ? aspirin 81 MG chewable tablet Chew 81 mg by mouth every evening.    ? dapagliflozin propanediol (FARXIGA) 10 MG TABS tablet Take 1 tablet (10 mg total) by mouth daily before breakfast. 90 tablet 3  ? doxazosin (CARDURA) 2 MG tablet Take 1 tablet (2 mg total) by mouth daily. 30 tablet 11  ? ezetimibe (ZETIA) 10 MG tablet TAKE 1 TABLET BY MOUTH DAILY. 90 tablet 3  ? metoprolol succinate (TOPROL-XL) 25 MG 24 hr tablet TAKE 1 TABLET ONCE DAILY. 90 tablet 3  ? nitroGLYCERIN (NITROSTAT) 0.4 MG SL tablet 1 TAB UNDER TONGUE AS NEEDED FOR CHEST PAIN. MAY REPEAT EVERY 5 MIN FOR A TOTAL OF 3 DOSES. 25 tablet 0  ? pantoprazole (PROTONIX) 40 MG tablet TAKE 1 TABLET ONCE DAILY. 90 tablet 3  ? rosuvastatin (CRESTOR) 40 MG tablet Take 1 tablet by mouth daily.    ? sacubitril-valsartan (ENTRESTO) 97-103 MG Take 1 tablet by mouth 2 (two) times daily. 180 tablet 3  ? spironolactone (ALDACTONE) 25 MG tablet TAKE 1 TABLET ONCE DAILY.  90 tablet 3  ? ?No current facility-administered medications on file prior to visit.  ? ? ?Allergies  ?Allergen Reactions  ? Morphine Nausea Only  ? ? ?Assessment/Plan: ? ?1. Hyperlipidemia - LDL-C is above goal of <55. We discussed PCSK9i. Discussed cost, injection technique, side effects and efficacy. Patient a little hesitant about needles and about dosing schedule and remembering to take q 2 weeks. We talked about ways to remember dose. We also discussed Nexlezet, its side effects, cost and efficacy. Would need healthwell grant for both. Patient wishes to discuss with his wife and get back with me. Direct line given. ? ? ?Thank you, ? ? ?Olene Floss, Pharm.D, BCPS, CPP ?[facility]  ?[address], [place], [place] [address]  ?Phone: [phone]; Fax: [phone]  ? ? ?

## 2021-11-27 NOTE — Patient Instructions (Signed)
Please discuss with your wife starting Repatha or Praluent. ? ?Please call me at [phone] with any questions ? ?

## 2021-11-30 DIAGNOSIS — I493 Ventricular premature depolarization: Secondary | ICD-10-CM | POA: Diagnosis not present

## 2021-12-07 ENCOUNTER — Telehealth: Payer: Self-pay | Admitting: Pharmacist

## 2021-12-07 DIAGNOSIS — E7849 Other hyperlipidemia: Secondary | ICD-10-CM

## 2021-12-07 NOTE — Telephone Encounter (Signed)
Patient called stating that he has decided that he does want to proceed with PCSK9i. I will submit PA. Once approved will get healthwell grant.  ? ?PA for Praluent submitted ?

## 2021-12-08 MED ORDER — PRALUENT 75 MG/ML ~~LOC~~ SOAJ: 1.0000 "pen " | SUBCUTANEOUS | 11 refills | Status: DC

## 2021-12-08 NOTE — Telephone Encounter (Signed)
Patient made aware that both PA and grant approved. He will stop zetia and start Praluent. Continue rosuvastatin. Repeat labs 6/19. ?

## 2021-12-08 NOTE — Telephone Encounter (Signed)
Praluent approved through 09/12/22 ?Healthwell grant approved ? ?CARD NO. ?[id number] ?  ?CARD STATUS ?Active ?  ?BIN ?[id number] ?  ?PCN ?[id number] ?  ?PC GROUP ?[id number] ? ? ?

## 2021-12-08 NOTE — Addendum Note (Signed)
Addended by: Malena Peer D on: 12/08/2021 10:50 AM ? ? Modules accepted: Orders ? ?

## 2021-12-08 NOTE — Telephone Encounter (Signed)
Called and provided the healthwell information to [place] date city pharmacy for the praluent 75mg  and they stated that they cost would be $0  ?

## 2021-12-10 DIAGNOSIS — Z961 Presence of intraocular lens: Secondary | ICD-10-CM | POA: Diagnosis not present

## 2021-12-23 ENCOUNTER — Other Ambulatory Visit (HOSPITAL_COMMUNITY): Payer: Self-pay | Admitting: Internal Medicine

## 2022-01-04 ENCOUNTER — Other Ambulatory Visit (HOSPITAL_COMMUNITY): Payer: Self-pay | Admitting: *Deleted

## 2022-01-04 ENCOUNTER — Telehealth (HOSPITAL_COMMUNITY): Payer: Self-pay | Admitting: Pharmacy Technician

## 2022-01-04 MED ORDER — ENTRESTO 97-103 MG PO TABS: 1.0000 | ORAL_TABLET | Freq: Two times a day (BID) | ORAL | 3 refills | Status: DC

## 2022-01-04 NOTE — Telephone Encounter (Signed)
Advanced Heart Failure Patient Advocate Encounter ? ?The patient was approved for a Healthwell grant that will help cover the cost of Entresto. Total amount awarded, $10,000. Eligibility, 12/05/21 - 12/05/22. ? ?ID [id number] ? ?BIN [id number] ? ?PCN [id number] ? ?Group [id number] ? ?Emailed the patient a copy of the grant information. Sent 90 day RX request to [place] (CMA) to send to [facility] pharmacy with the attached grant information. ? ?Archer Asa, CPhT ? ? ?

## 2022-01-07 ENCOUNTER — Telehealth (HOSPITAL_COMMUNITY): Payer: Self-pay | Admitting: Pharmacy Technician

## 2022-01-07 NOTE — Telephone Encounter (Signed)
Advanced Heart Failure Patient Advocate Encounter ? ?Patient called and stated that he was not sure where his grant information was for [place]. Emailed [employer]. PAN grant only has $219 left. Advised the patient that the pharmacy can bill insurance, then the pan grant as secondary, then the [employer] as tertiary. He can use the [employer] moving forward for the [place] and [place].  ? ?Patient had questions about Praluent grant. I saw a note from [facility] regarding the [employer] the patient has for that.  ? ?Sent the patient an email with the details of all 3 grants. ? ?Charlann Boxer, CPhT ? ?

## 2022-01-20 DIAGNOSIS — Z1339 Encounter for screening examination for other mental health and behavioral disorders: Secondary | ICD-10-CM | POA: Diagnosis not present

## 2022-01-20 DIAGNOSIS — D692 Other nonthrombocytopenic purpura: Secondary | ICD-10-CM | POA: Diagnosis not present

## 2022-01-20 DIAGNOSIS — M722 Plantar fascial fibromatosis: Secondary | ICD-10-CM | POA: Diagnosis not present

## 2022-01-20 DIAGNOSIS — Z1331 Encounter for screening for depression: Secondary | ICD-10-CM | POA: Diagnosis not present

## 2022-01-20 DIAGNOSIS — Z96641 Presence of right artificial hip joint: Secondary | ICD-10-CM | POA: Diagnosis not present

## 2022-01-20 DIAGNOSIS — H9193 Unspecified hearing loss, bilateral: Secondary | ICD-10-CM | POA: Diagnosis not present

## 2022-01-20 DIAGNOSIS — E663 Overweight: Secondary | ICD-10-CM | POA: Diagnosis not present

## 2022-01-20 DIAGNOSIS — D638 Anemia in other chronic diseases classified elsewhere: Secondary | ICD-10-CM | POA: Diagnosis not present

## 2022-01-20 DIAGNOSIS — N401 Enlarged prostate with lower urinary tract symptoms: Secondary | ICD-10-CM | POA: Diagnosis not present

## 2022-01-20 DIAGNOSIS — E7800 Pure hypercholesterolemia, unspecified: Secondary | ICD-10-CM | POA: Diagnosis not present

## 2022-01-20 DIAGNOSIS — I119 Hypertensive heart disease without heart failure: Secondary | ICD-10-CM | POA: Diagnosis not present

## 2022-01-20 DIAGNOSIS — I2510 Atherosclerotic heart disease of native coronary artery without angina pectoris: Secondary | ICD-10-CM | POA: Diagnosis not present

## 2022-01-20 DIAGNOSIS — I779 Disorder of arteries and arterioles, unspecified: Secondary | ICD-10-CM | POA: Diagnosis not present

## 2022-01-20 DIAGNOSIS — M1990 Unspecified osteoarthritis, unspecified site: Secondary | ICD-10-CM | POA: Diagnosis not present

## 2022-02-01 ENCOUNTER — Telehealth (HOSPITAL_COMMUNITY): Payer: Self-pay | Admitting: Pharmacy Technician

## 2022-02-01 NOTE — Telephone Encounter (Signed)
Advanced Heart Failure Patient Advocate Encounter  Patient called stating that the pharmacy told him he needed a voucher for [place]. Called the patient back and left a message. Reminded him about the email that was sent on 04/24 with the [place] details. The patient can use it on both [place].  Advised him to call back if he had further issues.  Charlann Boxer, CPhT

## 2022-02-16 DIAGNOSIS — Z822 Family history of deafness and hearing loss: Secondary | ICD-10-CM | POA: Diagnosis not present

## 2022-02-16 DIAGNOSIS — H903 Sensorineural hearing loss, bilateral: Secondary | ICD-10-CM | POA: Diagnosis not present

## 2022-02-23 ENCOUNTER — Other Ambulatory Visit (HOSPITAL_COMMUNITY): Payer: Self-pay | Admitting: Internal Medicine

## 2022-03-01 ENCOUNTER — Other Ambulatory Visit: Payer: Medicare HMO

## 2022-03-02 ENCOUNTER — Other Ambulatory Visit: Payer: Medicare HMO

## 2022-03-02 DIAGNOSIS — E7849 Other hyperlipidemia: Secondary | ICD-10-CM

## 2022-03-03 LAB — LIPID PANEL
Chol/HDL Ratio: 3.3 ratio (ref 0.0–5.0)
Cholesterol, Total: 121 mg/dL (ref 100–199)
HDL: 37 mg/dL — ABNORMAL LOW (ref >39)
LDL Chol Calc (NIH): 63 mg/dL (ref 0–99)
Triglycerides: 112 mg/dL (ref 0–149)
VLDL Cholesterol Cal: 21 mg/dL (ref 5–40)

## 2022-03-03 LAB — HEPATIC FUNCTION PANEL
ALT: 11 IU/L (ref 0–44)
AST: 16 IU/L (ref 0–40)
Albumin: 4.3 g/dL (ref 3.7–4.7)
Alkaline Phosphatase: 52 IU/L (ref 44–121)
Bilirubin Total: 0.5 mg/dL (ref 0.0–1.2)
Bilirubin, Direct: 0.16 mg/dL (ref 0.00–0.40)
Total Protein: 6.3 g/dL (ref 6.0–8.5)

## 2022-03-03 LAB — APOLIPOPROTEIN B: Apolipoprotein B: 66 mg/dL (ref <90)

## 2022-03-09 ENCOUNTER — Telehealth: Payer: Self-pay | Admitting: Pharmacist

## 2022-03-09 NOTE — Telephone Encounter (Signed)
Spoke with patient. LDL-C is close to goal of <74. ApoB close to <60. Pt prefers to stay on Praluent 75mg  for a little longer. Option to increase to 150mg  was given. Continue Praluent 75mg  q 14 days and rosuvastatin 40mg  daily. Recheck labs in 3 months.

## 2022-04-21 ENCOUNTER — Telehealth (HOSPITAL_COMMUNITY): Payer: Self-pay | Admitting: Pharmacy Technician

## 2022-04-21 NOTE — Telephone Encounter (Signed)
Advanced Heart Failure Patient Advocate Encounter  Patient called stating that the PAN grant was going to expire. No action needed. Advised patient to continue using Healthwell grant for both [place].  Archer Asa, CPhT

## 2022-04-26 ENCOUNTER — Other Ambulatory Visit (HOSPITAL_COMMUNITY): Payer: Self-pay | Admitting: Internal Medicine

## 2022-05-04 ENCOUNTER — Other Ambulatory Visit (HOSPITAL_COMMUNITY): Payer: Self-pay | Admitting: *Deleted

## 2022-05-04 ENCOUNTER — Other Ambulatory Visit (HOSPITAL_COMMUNITY): Payer: Self-pay | Admitting: Internal Medicine

## 2022-05-04 MED ORDER — AMOXICILLIN 500 MG PO CAPS: ORAL_CAPSULE | ORAL | 3 refills | Status: AC

## 2022-06-11 DIAGNOSIS — Z961 Presence of intraocular lens: Secondary | ICD-10-CM | POA: Diagnosis not present

## 2022-06-29 ENCOUNTER — Telehealth: Payer: Self-pay | Admitting: Pharmacist

## 2022-06-29 DIAGNOSIS — I214 Non-ST elevation (NSTEMI) myocardial infarction: Secondary | ICD-10-CM

## 2022-06-29 DIAGNOSIS — E785 Hyperlipidemia, unspecified: Secondary | ICD-10-CM

## 2022-06-29 NOTE — Telephone Encounter (Signed)
Called pt to schedule follow up labs. Pt scheduled for 10/23.

## 2022-06-30 DIAGNOSIS — M25551 Pain in right hip: Secondary | ICD-10-CM | POA: Diagnosis not present

## 2022-06-30 DIAGNOSIS — M48061 Spinal stenosis, lumbar region without neurogenic claudication: Secondary | ICD-10-CM | POA: Diagnosis not present

## 2022-07-05 ENCOUNTER — Ambulatory Visit: Payer: Medicare HMO | Attending: Internal Medicine

## 2022-07-05 DIAGNOSIS — E785 Hyperlipidemia, unspecified: Secondary | ICD-10-CM | POA: Diagnosis not present

## 2022-07-05 DIAGNOSIS — I214 Non-ST elevation (NSTEMI) myocardial infarction: Secondary | ICD-10-CM | POA: Diagnosis not present

## 2022-07-06 LAB — APOLIPOPROTEIN B: Apolipoprotein B: 59 mg/dL (ref <90)

## 2022-07-06 LAB — LIPID PANEL
Chol/HDL Ratio: 2.7 ratio (ref 0.0–5.0)
Cholesterol, Total: 117 mg/dL (ref 100–199)
HDL: 44 mg/dL (ref >39)
LDL Chol Calc (NIH): 55 mg/dL (ref 0–99)
Triglycerides: 94 mg/dL (ref 0–149)
VLDL Cholesterol Cal: 18 mg/dL (ref 5–40)

## 2022-07-20 DIAGNOSIS — E7800 Pure hypercholesterolemia, unspecified: Secondary | ICD-10-CM | POA: Diagnosis not present

## 2022-07-20 DIAGNOSIS — Z125 Encounter for screening for malignant neoplasm of prostate: Secondary | ICD-10-CM | POA: Diagnosis not present

## 2022-07-20 DIAGNOSIS — R7989 Other specified abnormal findings of blood chemistry: Secondary | ICD-10-CM | POA: Diagnosis not present

## 2022-07-23 DIAGNOSIS — Z1212 Encounter for screening for malignant neoplasm of rectum: Secondary | ICD-10-CM | POA: Diagnosis not present

## 2022-07-27 ENCOUNTER — Other Ambulatory Visit (HOSPITAL_COMMUNITY): Payer: Self-pay | Admitting: Internal Medicine

## 2022-07-27 DIAGNOSIS — Z96641 Presence of right artificial hip joint: Secondary | ICD-10-CM | POA: Diagnosis not present

## 2022-07-27 DIAGNOSIS — R82998 Other abnormal findings in urine: Secondary | ICD-10-CM | POA: Diagnosis not present

## 2022-07-27 DIAGNOSIS — Z1331 Encounter for screening for depression: Secondary | ICD-10-CM | POA: Diagnosis not present

## 2022-07-27 DIAGNOSIS — D638 Anemia in other chronic diseases classified elsewhere: Secondary | ICD-10-CM | POA: Diagnosis not present

## 2022-07-27 DIAGNOSIS — I2510 Atherosclerotic heart disease of native coronary artery without angina pectoris: Secondary | ICD-10-CM | POA: Diagnosis not present

## 2022-07-27 DIAGNOSIS — Z0000 Encounter for general adult medical examination without abnormal findings: Secondary | ICD-10-CM | POA: Diagnosis not present

## 2022-07-27 DIAGNOSIS — L989 Disorder of the skin and subcutaneous tissue, unspecified: Secondary | ICD-10-CM | POA: Diagnosis not present

## 2022-07-27 DIAGNOSIS — Z1339 Encounter for screening examination for other mental health and behavioral disorders: Secondary | ICD-10-CM | POA: Diagnosis not present

## 2022-07-27 DIAGNOSIS — E7800 Pure hypercholesterolemia, unspecified: Secondary | ICD-10-CM | POA: Diagnosis not present

## 2022-07-27 DIAGNOSIS — M1990 Unspecified osteoarthritis, unspecified site: Secondary | ICD-10-CM | POA: Diagnosis not present

## 2022-07-27 DIAGNOSIS — D692 Other nonthrombocytopenic purpura: Secondary | ICD-10-CM | POA: Diagnosis not present

## 2022-07-27 DIAGNOSIS — E663 Overweight: Secondary | ICD-10-CM | POA: Diagnosis not present

## 2022-07-27 DIAGNOSIS — K4090 Unilateral inguinal hernia, without obstruction or gangrene, not specified as recurrent: Secondary | ICD-10-CM | POA: Diagnosis not present

## 2022-07-27 DIAGNOSIS — I119 Hypertensive heart disease without heart failure: Secondary | ICD-10-CM | POA: Diagnosis not present

## 2022-07-27 DIAGNOSIS — H9193 Unspecified hearing loss, bilateral: Secondary | ICD-10-CM | POA: Diagnosis not present

## 2022-08-02 DIAGNOSIS — Z96641 Presence of right artificial hip joint: Secondary | ICD-10-CM | POA: Diagnosis not present

## 2022-08-02 DIAGNOSIS — M25551 Pain in right hip: Secondary | ICD-10-CM | POA: Diagnosis not present

## 2022-08-12 DIAGNOSIS — Z96641 Presence of right artificial hip joint: Secondary | ICD-10-CM | POA: Diagnosis not present

## 2022-08-12 DIAGNOSIS — M25551 Pain in right hip: Secondary | ICD-10-CM | POA: Diagnosis not present

## 2022-08-19 DIAGNOSIS — M25551 Pain in right hip: Secondary | ICD-10-CM | POA: Diagnosis not present

## 2022-08-19 DIAGNOSIS — Z96641 Presence of right artificial hip joint: Secondary | ICD-10-CM | POA: Diagnosis not present

## 2022-08-25 DIAGNOSIS — Z96641 Presence of right artificial hip joint: Secondary | ICD-10-CM | POA: Diagnosis not present

## 2022-08-25 DIAGNOSIS — M25551 Pain in right hip: Secondary | ICD-10-CM | POA: Diagnosis not present

## 2022-09-01 DIAGNOSIS — Z96641 Presence of right artificial hip joint: Secondary | ICD-10-CM | POA: Diagnosis not present

## 2022-09-01 DIAGNOSIS — M25551 Pain in right hip: Secondary | ICD-10-CM | POA: Diagnosis not present

## 2022-09-09 DIAGNOSIS — M25551 Pain in right hip: Secondary | ICD-10-CM | POA: Diagnosis not present

## 2022-09-09 DIAGNOSIS — Z96641 Presence of right artificial hip joint: Secondary | ICD-10-CM | POA: Diagnosis not present

## 2022-09-10 ENCOUNTER — Other Ambulatory Visit (HOSPITAL_COMMUNITY): Payer: Self-pay | Admitting: Internal Medicine

## 2022-09-17 DIAGNOSIS — Z96641 Presence of right artificial hip joint: Secondary | ICD-10-CM | POA: Diagnosis not present

## 2022-09-17 DIAGNOSIS — M25551 Pain in right hip: Secondary | ICD-10-CM | POA: Diagnosis not present

## 2022-09-20 ENCOUNTER — Telehealth: Payer: Self-pay | Admitting: Pharmacist

## 2022-09-20 ENCOUNTER — Other Ambulatory Visit: Payer: Self-pay | Admitting: *Deleted

## 2022-09-20 ENCOUNTER — Encounter: Payer: Self-pay | Admitting: Pharmacist

## 2022-09-20 DIAGNOSIS — I6523 Occlusion and stenosis of bilateral carotid arteries: Secondary | ICD-10-CM

## 2022-09-20 MED ORDER — REPATHA SURECLICK 140 MG/ML ~~LOC~~ SOAJ: 1.0000 | SUBCUTANEOUS | 3 refills | Status: DC

## 2022-09-20 NOTE — Telephone Encounter (Signed)
Praluent not on pt's formulary this year but Repatha is. Prior auth submitted, Key: [id number], approved through 09/13/23.  Rx sent to pharmacy, pt made aware, sent him [facility] grant info again in case the pharmacy needs it to re-link with his Repatha rx.

## 2022-09-23 DIAGNOSIS — M25551 Pain in right hip: Secondary | ICD-10-CM | POA: Diagnosis not present

## 2022-09-23 DIAGNOSIS — Z96641 Presence of right artificial hip joint: Secondary | ICD-10-CM | POA: Diagnosis not present

## 2022-09-24 ENCOUNTER — Encounter: Payer: Self-pay | Admitting: Surgery

## 2022-09-24 ENCOUNTER — Ambulatory Visit (HOSPITAL_COMMUNITY)
Admission: RE | Admit: 2022-09-24 | Discharge: 2022-09-24 | Disposition: A | Discharge status: Home / Self Care | Payer: Medicare HMO | Source: Ambulatory Visit | Attending: Surgery | Admitting: Surgery

## 2022-09-24 ENCOUNTER — Ambulatory Visit (INDEPENDENT_AMBULATORY_CARE_PROVIDER_SITE_OTHER): Payer: Medicare HMO | Admitting: Surgery

## 2022-09-24 VITALS — BP 172/64 | HR 55 | Temp 98.2°F | Resp 20 | Ht 67.0 in | Wt 190.0 lb

## 2022-09-24 DIAGNOSIS — I6523 Occlusion and stenosis of bilateral carotid arteries: Secondary | ICD-10-CM

## 2022-09-24 NOTE — Progress Notes (Signed)
[facility] [place]  Patient name: [name] MRN: [id number] DOB: [birth year] Sex: male   REASON FOR VISIT:    Follow up  HISOTRY OF PRESENT ILLNESS:   [name] is a 82 y.o. male who is back today for follow-up of his carotid disease.  He is status post left carotid endarterectomy on 11/21/2020 for asymptomatic stenosis.  I have been following his right carotid disease which is progressed to greater than 80% by duplex and 75% by CTA.  He remains asymptomatic.   Patient with history of coronary artery disease, status post CABG as well as PCI.  He is medically managed for hypertension.  He is on a statin for hypercholesterolemia.  He is also treated for congestive heart failure.    PAST MEDICAL HISTORY:   Past Medical History:  Diagnosis Date   Arthritis    hips   CAD (coronary artery disease)    s/p multiple PCIs to RCA dating back to 38. Last stent to proximal RCA in November of 2011. Also had 99% stenosis in the distal PDA with left to right collaterals and nonobstructive disease in the left coronary tree. ;  ISR of RCA 10/12 - CABG 10/9 with Dr. Lucianne Lei [name]: L-LAD, S-OM, S-PDA   Carotid bruit    carotid u/s 2/12: 40-59% bilaterally. (stable)   GERD (gastroesophageal reflux disease)    HTN (hypertension)    Hyperlipidemia      FAMILY HISTORY:   Family History  Problem Relation Age of Onset   Heart disease Father    Colon cancer Neg Hx    Stomach cancer Neg Hx     SOCIAL HISTORY:   Social History   Tobacco Use   Smoking status: Never   Smokeless tobacco: Never  Substance Use Topics   Alcohol use: Yes    Alcohol/week: 6.0 standard drinks of alcohol    Types: 6 Cans of beer per week    Comment: weekly     ALLERGIES:   Allergies  Allergen Reactions   Morphine Nausea Only     CURRENT MEDICATIONS:   Current Outpatient Medications  Medication Sig Dispense Refill   acetaminophen  (TYLENOL) 325 MG tablet Take 650 mg by mouth every 6 (six) hours as needed.     amLODipine (NORVASC) 10 MG tablet TAKE 1 TABLET ONCE DAILY. 90 tablet 3   amoxicillin (AMOXIL) 500 MG capsule take FOUR capsules ONE hour prior TO appt. 12 capsule 3   aspirin 81 MG chewable tablet Chew 81 mg by mouth every evening.     dapagliflozin propanediol (FARXIGA) 10 MG TABS tablet Take 1 tablet (10 mg total) by mouth daily before breakfast. PLEASE CALL OFFICE TO ARRANGE YOUR 6MTH APPOINTMENT 90 tablet 3   doxazosin (CARDURA) 2 MG tablet Take 1 tablet (2 mg total) by mouth daily. 30 tablet 11   Evolocumab (REPATHA SURECLICK) 937 MG/ML SOAJ Inject 140 mg into the skin every 14 (fourteen) days. 6 mL 3   metoprolol succinate (TOPROL-XL) 25 MG 24 hr tablet TAKE 1 TABLET ONCE DAILY. 90 tablet 3   nitroGLYCERIN (NITROSTAT) 0.4 MG SL tablet 1 TAB UNDER TONGUE AS NEEDED FOR CHEST PAIN. MAY REPEAT EVERY 5 MIN FOR A TOTAL OF 3 DOSES. 25 tablet 0   pantoprazole (PROTONIX) 40 MG tablet TAKE ONE TABLET ONCE DAILY. 90 tablet 3   rosuvastatin (CRESTOR) 40 MG tablet Take 1 tablet by mouth daily.     sacubitril-valsartan (ENTRESTO) 97-103 MG Take  1 tablet by mouth 2 (two) times daily. 180 tablet 3   spironolactone (ALDACTONE) 25 MG tablet TAKE 1 TABLET ONCE DAILY. 90 tablet 3   No current facility-administered medications for this visit.    REVIEW OF SYSTEMS:   [X]  denotes positive finding, [ ]  denotes negative finding Cardiac  Comments:  Chest pain or chest pressure:    Shortness of breath upon exertion:    Short of breath when lying flat:    Irregular heart rhythm:        Vascular    Pain in calf, thigh, or hip brought on by ambulation:    Pain in feet at night that wakes you up from your sleep:     Blood clot in your veins:    Leg swelling:         Pulmonary    Oxygen at home:    Productive cough:     Wheezing:         Neurologic    Sudden weakness in arms or legs:     Sudden numbness in arms or legs:      Sudden onset of difficulty speaking or slurred speech:    Temporary loss of vision in one eye:     Problems with dizziness:         Gastrointestinal    Blood in stool:     Vomited blood:         Genitourinary    Burning when urinating:     Blood in urine:        Psychiatric    Major depression:         Hematologic    Bleeding problems:    Problems with blood clotting too easily:        Skin    Rashes or ulcers:        Constitutional    Fever or chills:      PHYSICAL EXAM:   Vitals:   09/24/22 0924 09/24/22 0926  BP: (!) 178/64 (!) 172/64  Pulse: (!) 55   Resp: 20   Temp: 98.2 F (36.8 C)   SpO2: 96%   Weight: 190 lb (86.2 kg)   Height: 5\' 7"  (1.702 m)     GENERAL: The patient is a well-nourished male, in no acute distress. The vital signs are documented above. CARDIAC: There is a regular rate and rhythm.  PULMONARY: Non-labored respirations MUSCULOSKELETAL: There are no major deformities or cyanosis. NEUROLOGIC: No focal weakness or paresthesias are detected. SKIN: There are no ulcers or rashes noted. PSYCHIATRIC: The patient has a normal affect.  STUDIES:   I have reviewed the following:  Right Carotid: Velocities in the right ICA are consistent with a 40-59%                 stenosis. The ECA appears >50% stenosed.   Left Carotid: Velocities in the left ICA are consistent with a 1-39%  stenosis.               Patent CEA with hyperplasia.   Vertebrals:  Bilateral vertebral arteries demonstrate antegrade flow.  Subclavians: Normal flow hemodynamics were seen in bilateral subclavian               arteries.   MEDICAL ISSUES:   Carotid: The patient remains asymptomatic.  His left carotid endarterectomy site is widely patent.  Velocities on the right side are consistent with 40-59% stenosis, however they have been higher in the past.  I discussed that as  long as he remains asymptomatic and less than 80% stenosis, we will continue with ultrasound  surveillance.  He will follow-up in 1 year with repeat ultrasound.    [name] Alf, MD, FACS [facility] [phone] Pager [phone]

## 2022-10-05 DIAGNOSIS — M25551 Pain in right hip: Secondary | ICD-10-CM | POA: Diagnosis not present

## 2022-10-05 DIAGNOSIS — Z96641 Presence of right artificial hip joint: Secondary | ICD-10-CM | POA: Diagnosis not present

## 2022-10-07 ENCOUNTER — Ambulatory Visit (HOSPITAL_COMMUNITY)
Admission: RE | Admit: 2022-10-07 | Discharge: 2022-10-07 | Disposition: A | Discharge status: Home / Self Care | Payer: Medicare HMO | Source: Ambulatory Visit | Attending: Internal Medicine | Admitting: Internal Medicine

## 2022-10-07 ENCOUNTER — Encounter (HOSPITAL_COMMUNITY): Payer: Self-pay | Admitting: Internal Medicine

## 2022-10-07 VITALS — BP 124/64 | HR 60 | Wt 190.6 lb

## 2022-10-07 DIAGNOSIS — Z8249 Family history of ischemic heart disease and other diseases of the circulatory system: Secondary | ICD-10-CM | POA: Diagnosis not present

## 2022-10-07 DIAGNOSIS — I2510 Atherosclerotic heart disease of native coronary artery without angina pectoris: Secondary | ICD-10-CM

## 2022-10-07 DIAGNOSIS — I110 Hypertensive heart disease with heart failure: Secondary | ICD-10-CM | POA: Diagnosis not present

## 2022-10-07 DIAGNOSIS — I5022 Chronic systolic (congestive) heart failure: Secondary | ICD-10-CM

## 2022-10-07 DIAGNOSIS — I5020 Unspecified systolic (congestive) heart failure: Secondary | ICD-10-CM | POA: Insufficient documentation

## 2022-10-07 DIAGNOSIS — Z951 Presence of aortocoronary bypass graft: Secondary | ICD-10-CM | POA: Insufficient documentation

## 2022-10-07 DIAGNOSIS — E7849 Other hyperlipidemia: Secondary | ICD-10-CM

## 2022-10-07 DIAGNOSIS — M25551 Pain in right hip: Secondary | ICD-10-CM | POA: Insufficient documentation

## 2022-10-07 DIAGNOSIS — I255 Ischemic cardiomyopathy: Secondary | ICD-10-CM | POA: Insufficient documentation

## 2022-10-07 DIAGNOSIS — I6522 Occlusion and stenosis of left carotid artery: Secondary | ICD-10-CM | POA: Insufficient documentation

## 2022-10-07 DIAGNOSIS — E785 Hyperlipidemia, unspecified: Secondary | ICD-10-CM | POA: Insufficient documentation

## 2022-10-07 DIAGNOSIS — Z79899 Other long term (current) drug therapy: Secondary | ICD-10-CM | POA: Insufficient documentation

## 2022-10-07 NOTE — Addendum Note (Signed)
Encounter addended by: Jerl Mina, RN on: 10/07/2022 10:28 AM  Actions taken: Order list changed, Diagnosis association updated, Clinical Note Signed

## 2022-10-07 NOTE — Progress Notes (Signed)
Advanced Heart Failure Clinic Note   Date:  10/07/2022   ID:  [name], DOB April 13, 1941, MRN [id number]  Location: Home  Provider location: [facility] Type of Visit: Established patient  PCP:  Tisovec, [name] Him, MD  Cardiologist:  None Primary HF: [name]  Chief Complaint: Heart Failure    History of Present Illness:  [name] is a 82 y.o. male with history of CAD, multiple angioplasties to the RCA dating back to 1983 followed by CABG in 2012, HTN, and hyperlipidemia.  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    He was hospitalized in 11/11 because of recurrent chest pain. He underwent drug-eluting stent to the proximal RCA July 24, 2010. Ejection fraction was 65%. He also had 99% stenosis in the distal PDA with left to right collaterals and nonobstructive disease in the left coronary tree.   Admitted in October 2012 with [place]. Found to have progressive CAD. Underwent Coronary artery bypass grafting x4 (left internal mammary artery to LAD, saphenous vein graft to circumflex marginal, saphenous vein graft to posterior descending branch of the distal right coronary, saphenous vein graft to the posterolateral branch of the distal right coronary artery).   Had recurrent CP in 8/21 underwent cath which showed stable anatomy no targets for PCI  1. 3v CAD with anomalous LM coming from the [place] [facility] 2. LAD 100% 3. RCA 100% 4. LCX 50% in mid LCX 5. LIMA to LAD widely patent 6. SVG to R PDA widely patent 7. SVG to OM occluded ostially 8. SVG to PL occluded ostially Has moderate disease in anomalous LCX but not crittcal and anatomy precludes PCI. 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 He was discharged 04/23/20. On 04/25/20 he had AMS was sent to [facility] ED. HS Trop 111-->1421 -> 17,390. It was unclear if this was demand ischemia. Had Leon on 04/28/20 with no targets for  revascularization and medical management. ECHO showed EF 40-45%.  Echo 7/22 EF 29-52%  Underwent LICA Carotid endarterectomy 3/22. Follows with Dr. Trula Slade R carotid 40-59%  Today he returns for HF follow up. Had bad URI last week. Now improving but still with cough. NO CP. Breathing ok. [facility] ([facility] with [facility]). Still struggling with R hip pain. Going to PT.    Past Medical History:  Diagnosis Date   Arthritis    hips   CAD (coronary artery disease)    s/p multiple PCIs to RCA dating back to 78. Last stent to proximal RCA in November of 2011. Also had 99% stenosis in the distal PDA with left to right collaterals and nonobstructive disease in the left coronary tree. ;  ISR of RCA 10/12 - CABG 10/9 with Dr. Lucianne Lei Trigt: L-LAD, S-OM, S-PDA   Carotid bruit    carotid u/s 2/12: 40-59% bilaterally. (stable)   GERD (gastroesophageal reflux disease)    HTN (hypertension)    Hyperlipidemia    Past Surgical History:  Procedure Laterality Date   CARDIAC CATHETERIZATION     COLONOSCOPY     Coronary artery bypass grafting x4 (left internal mammary artery  06/23/2011   CORONARY STENT PLACEMENT  Nov 2011   prox RCA. Has had prior multiple procedures.   CORONARY/GRAFT ANGIOGRAPHY N/A 05/04/2019   Procedure: CORONARY/GRAFT ANGIOGRAPHY;  Surgeon: Jolaine Artist, MD;  Location: [facility];  Service: Cardiovascular;  Laterality: N/A;   ENDARTERECTOMY Left 11/21/2020   Procedure: LEFT CAROTID ENDARTERECTOMY;  Surgeon: Nada Libman, MD;  Location: [facility];  Service: Vascular;  Laterality: Left;   INGUINAL HERNIA REPAIR Right 06/10/2020   Procedure: RIGHT INGUINAL HERNIA;  Surgeon: Harriette Bouillon, MD;  Location: [facility] OR;  Service: General;  Laterality: Right;  GENERAL AND TAP BLOCK   INSERTION OF MESH Right 06/10/2020   Procedure: INSERTION OF MESH;  Surgeon: Harriette Bouillon, MD;  Location: [facility];  Service: General;  Laterality: Right;   JOINT REPLACEMENT     LEFT  HEART CATH AND CORS/GRAFTS ANGIOGRAPHY N/A 04/28/2020   Procedure: LEFT HEART CATH AND CORS/GRAFTS ANGIOGRAPHY;  Surgeon: Runell Gess, MD;  [facility];  Service: Cardiovascular;  Laterality: N/A;   left total hip replacement  2008   PATCH ANGIOPLASTY Left 11/21/2020   Procedure: PATCH ANGIOPLASTY;  Surgeon: Nada Libman, MD;  Location: [facility] OR;  Service: Vascular;  Laterality: Left;   TONSILLECTOMY     TOOTH EXTRACTION  06/2013   TOTAL HIP ARTHROPLASTY Right 04/22/2020   Procedure: TOTAL HIP ARTHROPLASTY;  Surgeon: Teryl Lucy, MD;  Location: [facility];  Service: Orthopedics;  Laterality: Right;     Current Outpatient Medications  Medication Sig Dispense Refill   acetaminophen (TYLENOL) 325 MG tablet Take 650 mg by mouth every 6 (six) hours as needed.     amLODipine (NORVASC) 10 MG tablet TAKE 1 TABLET ONCE DAILY. 90 tablet 3   amoxicillin (AMOXIL) 500 MG capsule take FOUR capsules ONE hour prior TO appt. 12 capsule 3   aspirin 81 MG chewable tablet Chew 81 mg by mouth every evening.     dapagliflozin propanediol (FARXIGA) 10 MG TABS tablet Take 1 tablet (10 mg total) by mouth daily before breakfast. PLEASE CALL OFFICE TO ARRANGE YOUR APPOINTMENT 90 tablet 3   doxazosin (CARDURA) 2 MG tablet Take 1 tablet (2 mg total) by mouth daily. 30 tablet 11   Evolocumab (REPATHA SURECLICK) 140 MG/ML SOAJ Inject 140 mg into the skin every 14 (fourteen) days. 6 mL 3   metoprolol succinate (TOPROL-XL) 25 MG 24 hr tablet TAKE 1 TABLET ONCE DAILY. 90 tablet 3   nitroGLYCERIN (NITROSTAT) 0.4 MG SL tablet 1 TAB UNDER TONGUE AS NEEDED FOR CHEST PAIN. MAY REPEAT EVERY 5 MIN FOR A TOTAL OF 3 DOSES. 25 tablet 0   pantoprazole (PROTONIX) 40 MG tablet TAKE ONE TABLET ONCE DAILY. 90 tablet 3   rosuvastatin (CRESTOR) 40 MG tablet Take 1 tablet by mouth daily.     sacubitril-valsartan (ENTRESTO) 97-103 MG Take 1 tablet by mouth 2 (two) times daily. 180 tablet 3   spironolactone (ALDACTONE)  25 MG tablet TAKE 1 TABLET ONCE DAILY. 90 tablet 3   No current facility-administered medications for this encounter.    Allergies:   Morphine   Social History:  The patient  reports that he has never smoked. He has never used smokeless tobacco. He reports current alcohol use of about 6.0 standard drinks of alcohol per week. He reports that he does not use drugs.   Family History:  The patient's family history includes Heart disease in his father.   ROS:  Please see the history of present illness.   All other systems are personally reviewed and negative.   Vitals:   10/07/22 0941  BP: 124/64  Pulse: 60  SpO2: 94%   Wt Readings from Last 3 Encounters:  10/07/22 86.5 kg (190 lb 9.6 oz)  09/24/22 86.2 kg (190 lb)  11/23/21 87.9 kg (193 lb 12.8 oz)     Exam:   General:  Well appearing. No resp difficulty HEENT: normal Neck: supple. no JVD. Carotids 2+ bilat; + R bruits. L CEA scar No lymphadenopathy or thryomegaly appreciated. Cor: PMI nondisplaced. Regular rate & rhythm. No rubs, gallops or murmurs. Lungs: coarse Abdomen: soft, nontender, nondistended. No hepatosplenomegaly. No bruits or masses. Good bowel sounds. Extremities: no cyanosis, clubbing, rash, edema Neuro: alert & orientedx3, cranial nerves grossly intact. moves all 4 extremities w/o difficulty. Affect pleasant   Recent Labs: 10/08/2021: BUN 24; Creatinine, Ser 0.94; Potassium 4.4; Sodium 138 03/02/2022: ALT 11  Personally reviewed   Wt Readings from Last 3 Encounters:  10/07/22 86.5 kg (190 lb 9.6 oz)  09/24/22 86.2 kg (190 lb)  11/23/21 87.9 kg (193 lb 12.8 oz)    ASSESSMENT AND PLAN:  1. CAD S/p CABG 2012 - Cath 04/2020 with stable revascularization. - No s/s angina - Continue statin and ASA.  2. 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  - Echo 7/22 EF 55-60% (probably closer to 50-55% by my read) - NYHA I Volume ok - Cotninue Entresto 97/103 mg bid. - Continue spiro 25  mg daily. - Continue Toprol XL 25 mg daily. Heart rate limits titration.  - Continue Farxiga 10 mg daily. - F/u 9 months with echo   3. HTN - Blood pressure well controlled. Continue current regimen.  4. Carotid stenosis - CT scan (2/22) shows a 90% left carotid stenosis.  - L CEA 11/2020  - Carotid u/s R  40-59% following with Dr. Trula Slade   5. HL - Continue crestor 40 (recently switched from atorva 80) mg daily + Zetia. - Followed by Dr. Osborne Casco LDL 60  Glori Bickers, MD  10/07/2022 10:10 AM  Advanced Heart Failure [place] [id number] [phone] (office) [phone] (fax)

## 2022-10-07 NOTE — Patient Instructions (Signed)
There has been no changes to your medications.   Your physician has requested that you have an echocardiogram. Echocardiography is a painless test that uses sound waves to create images of your heart. It provides your doctor with information about the size and shape of your heart and how well your heart's chambers and valves are working. This procedure takes approximately one hour. There are no restrictions for this procedure. Please do NOT wear cologne, perfume, aftershave, or lotions (deodorant is allowed). Please arrive 15 minutes prior to your appointment time.  Your physician recommends that you schedule a follow-up appointment in: 9 months ( October 2024) with an echocardiogram ** please call the office in August in to arrange your follow up appointment **  If you have any questions or concerns before your next appointment please send [place] a message through [place] or call our office at [phone].    TO LEAVE A MESSAGE FOR THE NURSE SELECT OPTION 2, PLEASE LEAVE A MESSAGE INCLUDING: YOUR NAME DATE OF BIRTH CALL BACK NUMBER REASON FOR CALL**this is important as we prioritize the call backs  YOU WILL RECEIVE A CALL BACK THE SAME DAY AS LONG AS YOU CALL BEFORE 4:00 PM  At the [facility], you and your health needs are our priority. As part of our continuing mission to provide you with exceptional heart care, we have created designated Provider Care Teams. These Care Teams include your primary Cardiologist (physician) and Advanced Practice Providers (APPs- Physician Assistants and Nurse Practitioners) who all work together to provide you with the care you need, when you need it.   You may see any of the following providers on your designated Care Team at your next follow up: Dr Glori Bickers Dr Loralie Champagne Dr. Roxana Hires, NP Lyda Jester, [place] [facility] [place], [place] Forestine Na, NP Audry Riles, PharmD   Please be sure to bring  in all your medications bottles to every appointment.

## 2022-10-08 ENCOUNTER — Other Ambulatory Visit (HOSPITAL_COMMUNITY): Payer: Self-pay | Admitting: Internal Medicine

## 2022-10-11 ENCOUNTER — Telehealth (HOSPITAL_COMMUNITY): Payer: Self-pay | Admitting: Pharmacy Technician

## 2022-10-11 NOTE — Telephone Encounter (Signed)
Advanced Heart Failure Patient Advocate Encounter  Patient called and had questions about his [place]. Looks like it is set to expire 12/05/22. The pharmacy told the patient that he needed a new card. Was able to see that the pharmacy billed the grant on 01/26. Sounds like they billed the grant for [place] and not Entresto. Advised patient to speak with pharmacy about rebilling and reimbursing him for the [facility]. There are plenty of funds left on his grant at this time. The patient is aware that I will not be able to renew the grant until the end of Feb.  Aerie Donica F Rayanne Padmanabhan, CPhT

## 2022-10-21 DIAGNOSIS — Z96641 Presence of right artificial hip joint: Secondary | ICD-10-CM | POA: Diagnosis not present

## 2022-10-21 DIAGNOSIS — M25551 Pain in right hip: Secondary | ICD-10-CM | POA: Diagnosis not present

## 2022-10-26 DIAGNOSIS — Z96641 Presence of right artificial hip joint: Secondary | ICD-10-CM | POA: Diagnosis not present

## 2022-10-26 DIAGNOSIS — M25551 Pain in right hip: Secondary | ICD-10-CM | POA: Diagnosis not present

## 2022-11-30 ENCOUNTER — Encounter: Payer: Self-pay | Admitting: Pharmacist

## 2022-12-10 ENCOUNTER — Telehealth (HOSPITAL_COMMUNITY): Payer: Self-pay | Admitting: Pharmacy Technician

## 2022-12-10 NOTE — Telephone Encounter (Signed)
Advanced Heart Failure Patient Advocate Encounter  The patient called and left vm for help with Entresto and [place]. Called and spoke with the patient. [facility]) renewed this grant and [place] as well. Patient states pharmacy does not have information and he has not seen the mychart message she sent him. I was able to email the patient the information for both grants.   The patient was approved for a [employer] grant that will help cover the cost of Entresto, Farxiga, Metoprolol and Spironolactone. Total amount awarded, $10,000. Eligibility, 12/06/22 - 12/05/23.  ID [id number]  BIN [id number]  PCN [id number]  Group [id number]  Charlann Boxer, CPhT

## 2023-01-07 ENCOUNTER — Other Ambulatory Visit (HOSPITAL_COMMUNITY): Payer: Self-pay | Admitting: Internal Medicine

## 2023-01-27 DIAGNOSIS — I2510 Atherosclerotic heart disease of native coronary artery without angina pectoris: Secondary | ICD-10-CM | POA: Diagnosis not present

## 2023-01-27 DIAGNOSIS — D692 Other nonthrombocytopenic purpura: Secondary | ICD-10-CM | POA: Diagnosis not present

## 2023-01-27 DIAGNOSIS — L989 Disorder of the skin and subcutaneous tissue, unspecified: Secondary | ICD-10-CM | POA: Diagnosis not present

## 2023-01-27 DIAGNOSIS — K4090 Unilateral inguinal hernia, without obstruction or gangrene, not specified as recurrent: Secondary | ICD-10-CM | POA: Diagnosis not present

## 2023-01-27 DIAGNOSIS — D638 Anemia in other chronic diseases classified elsewhere: Secondary | ICD-10-CM | POA: Diagnosis not present

## 2023-01-27 DIAGNOSIS — E663 Overweight: Secondary | ICD-10-CM | POA: Diagnosis not present

## 2023-01-27 DIAGNOSIS — M722 Plantar fascial fibromatosis: Secondary | ICD-10-CM | POA: Diagnosis not present

## 2023-01-27 DIAGNOSIS — M1990 Unspecified osteoarthritis, unspecified site: Secondary | ICD-10-CM | POA: Diagnosis not present

## 2023-01-27 DIAGNOSIS — E7800 Pure hypercholesterolemia, unspecified: Secondary | ICD-10-CM | POA: Diagnosis not present

## 2023-01-27 DIAGNOSIS — I119 Hypertensive heart disease without heart failure: Secondary | ICD-10-CM | POA: Diagnosis not present

## 2023-02-07 DIAGNOSIS — J0100 Acute maxillary sinusitis, unspecified: Secondary | ICD-10-CM | POA: Diagnosis not present

## 2023-02-07 DIAGNOSIS — H5710 Ocular pain, unspecified eye: Secondary | ICD-10-CM | POA: Diagnosis not present

## 2023-02-07 DIAGNOSIS — H1033 Unspecified acute conjunctivitis, bilateral: Secondary | ICD-10-CM | POA: Diagnosis not present

## 2023-02-07 DIAGNOSIS — H5713 Ocular pain, bilateral: Secondary | ICD-10-CM | POA: Diagnosis not present

## 2023-03-01 ENCOUNTER — Other Ambulatory Visit (HOSPITAL_COMMUNITY): Payer: Self-pay | Admitting: Internal Medicine

## 2023-03-01 ENCOUNTER — Other Ambulatory Visit (HOSPITAL_COMMUNITY): Payer: Self-pay

## 2023-05-27 DIAGNOSIS — C4491 Basal cell carcinoma of skin, unspecified: Secondary | ICD-10-CM | POA: Diagnosis not present

## 2023-06-21 DIAGNOSIS — Z961 Presence of intraocular lens: Secondary | ICD-10-CM | POA: Diagnosis not present

## 2023-06-21 DIAGNOSIS — H524 Presbyopia: Secondary | ICD-10-CM | POA: Diagnosis not present

## 2023-07-05 DIAGNOSIS — D485 Neoplasm of uncertain behavior of skin: Secondary | ICD-10-CM | POA: Diagnosis not present

## 2023-07-05 DIAGNOSIS — C44319 Basal cell carcinoma of skin of other parts of face: Secondary | ICD-10-CM | POA: Diagnosis not present

## 2023-07-12 ENCOUNTER — Ambulatory Visit (HOSPITAL_COMMUNITY)
Admission: RE | Admit: 2023-07-12 | Discharge: 2023-07-12 | Disposition: A | Discharge status: Home / Self Care | Payer: Medicare HMO | Source: Ambulatory Visit | Attending: Internal Medicine | Admitting: Internal Medicine

## 2023-07-12 ENCOUNTER — Ambulatory Visit (HOSPITAL_BASED_OUTPATIENT_CLINIC_OR_DEPARTMENT_OTHER)
Admission: RE | Admit: 2023-07-12 | Discharge: 2023-07-12 | Disposition: A | Discharge status: Home / Self Care | Payer: Medicare HMO | Source: Ambulatory Visit | Attending: Internal Medicine | Admitting: Internal Medicine

## 2023-07-12 ENCOUNTER — Encounter (HOSPITAL_COMMUNITY): Payer: Self-pay | Admitting: Internal Medicine

## 2023-07-12 VITALS — BP 158/64 | HR 54 | Wt 196.8 lb

## 2023-07-12 DIAGNOSIS — I10 Essential (primary) hypertension: Secondary | ICD-10-CM

## 2023-07-12 DIAGNOSIS — I2510 Atherosclerotic heart disease of native coronary artery without angina pectoris: Secondary | ICD-10-CM | POA: Diagnosis not present

## 2023-07-12 DIAGNOSIS — Z7982 Long term (current) use of aspirin: Secondary | ICD-10-CM | POA: Insufficient documentation

## 2023-07-12 DIAGNOSIS — I6522 Occlusion and stenosis of left carotid artery: Secondary | ICD-10-CM | POA: Insufficient documentation

## 2023-07-12 DIAGNOSIS — Z951 Presence of aortocoronary bypass graft: Secondary | ICD-10-CM | POA: Diagnosis not present

## 2023-07-12 DIAGNOSIS — E785 Hyperlipidemia, unspecified: Secondary | ICD-10-CM | POA: Diagnosis not present

## 2023-07-12 DIAGNOSIS — I110 Hypertensive heart disease with heart failure: Secondary | ICD-10-CM | POA: Insufficient documentation

## 2023-07-12 DIAGNOSIS — I5022 Chronic systolic (congestive) heart failure: Secondary | ICD-10-CM

## 2023-07-12 DIAGNOSIS — R001 Bradycardia, unspecified: Secondary | ICD-10-CM | POA: Insufficient documentation

## 2023-07-12 DIAGNOSIS — Z79899 Other long term (current) drug therapy: Secondary | ICD-10-CM | POA: Insufficient documentation

## 2023-07-12 DIAGNOSIS — Q245 Malformation of coronary vessels: Secondary | ICD-10-CM | POA: Diagnosis not present

## 2023-07-12 LAB — ECHOCARDIOGRAM COMPLETE
Area-P 1/2: 2.86 cm2
MV M vel: 3.66 m/s
MV Peak grad: 53.6 mm[Hg]
MV VTI: 5.3 cm2
S' Lateral: 3.7 cm

## 2023-07-12 NOTE — Progress Notes (Signed)
  Echocardiogram 2D Echocardiogram has been performed.  [name] 07/12/2023, 9:50 AM

## 2023-07-12 NOTE — Progress Notes (Signed)
Advanced Heart Failure Clinic Note   Date:  07/12/2023   ID:  [name], DOB 1941/02/13, MRN [id number]  Location: Home  Provider location: [facility] Type of Visit: Established patient  PCP:  Tisovec, Adelfa Koh, MD  Cardiologist:  None Primary HF: [name]  Chief Complaint: Heart Failure    History of Present Illness:  [name] is a 82 y.o. male with history of CAD, multiple angioplasties to the RCA dating back to 1983 followed by CABG in 2012, HTN, and hyperlipidemia.  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    He was hospitalized in 11/11 because of recurrent chest pain. He underwent drug-eluting stent to the proximal RCA July 24, 2010. Ejection fraction was 65%. He also had 99% stenosis in the distal PDA with left to right collaterals and nonobstructive disease in the left coronary tree.   Admitted in October 2012 with [place]. Found to have progressive CAD. Underwent Coronary artery bypass grafting x4 (left internal mammary artery to LAD, saphenous vein graft to circumflex marginal, saphenous vein graft to posterior descending branch of the distal right coronary, saphenous vein graft to the posterolateral branch of the distal right coronary artery).   Had recurrent CP in 8/21 underwent cath which showed stable anatomy no targets for PCI  1. 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 LAD 100% 3. RCA 100% 4. LCX 50% in mid LCX 5. LIMA to LAD widely patent 6. SVG to R PDA widely patent 7. SVG to OM occluded ostially 8. SVG to PL occluded ostially Has moderate disease in anomalous LCX but not crittcal and anatomy precludes PCI. 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 He was discharged 04/23/20. On 04/25/20 he had AMS was sent to [facility] ED. HS Trop 111-->1421 -> 17,390. It was unclear if this was demand ischemia. Had LHC on 04/28/20 with no targets for  revascularization and medical management. ECHO showed EF 40-45%.  Echo 7/22 EF 50-55%  Underwent LICA Carotid endarterectomy 3/22. Follows with [name] R carotid 40-59%  Echo today 07/12/23 EF 50-55% Personally reviewed  Here for f/u. Feels good. Active. [facility] to [facility] 3[facility]. Chasing grandchildren. No CP or SOB. No edema, orthopnea or PND. No problems with medicines   Past Medical History:  Diagnosis Date   Arthritis    hips   CAD (coronary artery disease)    s/p multiple PCIs to RCA dating back to 34. Last stent to proximal RCA in November of 2011. Also had 99% stenosis in the distal PDA with left to right collaterals and nonobstructive disease in the left coronary tree. ;  ISR of RCA 10/12 - CABG 10/9 with Dr. Zenaida Niece [name]: L-LAD, S-OM, S-PDA   Carotid bruit    carotid u/s 2/12: 40-59% bilaterally. (stable)   GERD (gastroesophageal reflux disease)    HTN (hypertension)    Hyperlipidemia    Past Surgical History:  Procedure Laterality Date   CARDIAC CATHETERIZATION     COLONOSCOPY     Coronary artery bypass grafting x4 (left internal mammary artery  06/23/2011   CORONARY STENT PLACEMENT  Nov 2011   prox RCA. Has had prior multiple procedures.   CORONARY/GRAFT ANGIOGRAPHY N/A 05/04/2019   Procedure: CORONARY/GRAFT ANGIOGRAPHY;  Surgeon: [name] Patty, MD;  Location: [facility] INVASIVE CV LAB;  Service: Cardiovascular;  Laterality: N/A;   ENDARTERECTOMY Left 11/21/2020  Procedure: LEFT CAROTID ENDARTERECTOMY;  Surgeon: [name] Libman, MD;  Location[facility] OR;  Service: Vascular;  Laterality: Left;   INGUINAL HERNIA REPAIR Right 06/10/2020   Procedure: RIGHT INGUINAL HERNIA;  Surgeon: [name] Bouillon, MD;  Location[facility];  Service: General;  Laterality: Right;  GENERAL AND TAP BLOCK   INSERTION OF MESH Right 06/10/2020   Procedure: INSERTION OF MESH;  Surgeon: [name] Bouillon, MD;  Location: [facility];  Service: General;  Laterality: Right;   JOINT REPLACEMENT     LEFT HEART CATH AND  CORS/GRAFTS ANGIOGRAPHY N/A 04/28/2020   Procedure: LEFT HEART CATH AND CORS/GRAFTS ANGIOGRAPHY;  Surgeon: [name] Gess, MD;  [facility];  Service: Cardiovascular;  Laterality: N/A;   left total hip replacement  2008   PATCH ANGIOPLASTY Left 11/21/2020   Procedure: PATCH ANGIOPLASTY;  Surgeon: [name] Libman, MD;  Location: [facility] OR;  Service: Vascular;  Laterality: Left;   TONSILLECTOMY     TOOTH EXTRACTION  06/2013   TOTAL HIP ARTHROPLASTY Right 04/22/2020   Procedure: TOTAL HIP ARTHROPLASTY;  Surgeon: [name] Lucy, MD;  Location: [facility];  Service: Orthopedics;  Laterality: Right;     Current Outpatient Medications  Medication Sig Dispense Refill   acetaminophen (TYLENOL) 325 MG tablet Take 650 mg by mouth every 6 (six) hours as needed.     amLODipine (NORVASC) 10 MG tablet TAKE 1 TABLET ONCE DAILY. 90 tablet 3   amoxicillin (AMOXIL) 500 MG capsule take FOUR capsules ONE hour prior TO appt. 12 capsule 3   aspirin 81 MG chewable tablet Chew 81 mg by mouth every evening.     doxazosin (CARDURA) 2 MG tablet Take 1 tablet (2 mg total) by mouth daily. 30 tablet 11   ENTRESTO 97-103 MG TAKE ONE TABLET BY MOUTH TWICE DAILY 180 tablet 3   Evolocumab (REPATHA SURECLICK) 140 MG/ML SOAJ Inject 140 mg into the skin every 14 (fourteen) days. 6 mL 3   FARXIGA 10 MG TABS tablet Take 1 tablet (10 mg total) by mouth daily before breakfast. PLEASE CALL OFFICE TO ARRANGE YOUR 6 MONTH APPOINTMENT 90 tablet 11   metoprolol succinate (TOPROL-XL) 25 MG 24 hr tablet TAKE 1 TABLET ONCE DAILY. 90 tablet 3   nitroGLYCERIN (NITROSTAT) 0.4 MG SL tablet 1 TAB UNDER TONGUE AS NEEDED FOR CHEST PAIN. MAY REPEAT EVERY 5 MIN FOR A TOTAL OF 3 DOSES. 25 tablet 0   pantoprazole (PROTONIX) 40 MG tablet TAKE ONE TABLET BY MOUTH ONCE DAILY. 90 tablet 3   rosuvastatin (CRESTOR) 40 MG tablet Take 1 tablet by mouth daily.     spironolactone (ALDACTONE) 25 MG tablet TAKE 1 TABLET ONCE DAILY. 90 tablet 3   No  current facility-administered medications for this encounter.    Allergies:   Morphine   Social History:  The patient  reports that he has never smoked. He has never used smokeless tobacco. He reports current alcohol use of about 6.0 standard drinks of alcohol per week. He reports that he does not use drugs.   Family History:  The patient's family history includes Heart disease in his father.   ROS:  Please see the history of present illness.   All other systems are personally reviewed and negative.   Vitals:   07/12/23 1000  BP: (!) 158/64  Pulse: (!) 54  SpO2: 94%    Wt Readings from Last 3 Encounters:  07/12/23 89.3 kg (196 lb 12.8 oz)  10/07/22 86.5 kg (190 lb 9.6 oz)  09/24/22 86.2 kg (190 lb)     Exam:   General:  Well appearing. No resp difficulty HEENT: normal Neck: supple. no JVD. Carotids 2+ bilat; no bruits. No lymphadenopathy or thryomegaly appreciated. Cor: PMI nondisplaced. Regular rate & rhythm. No rubs, gallops or murmurs. Lungs: clear Abdomen: soft, nontender, nondistended. No hepatosplenomegaly. No bruits or masses. Good bowel sounds. Extremities: no cyanosis, clubbing, rash, edema Neuro: alert & orientedx3, cranial nerves grossly intact. moves all 4 extremities w/o difficulty. Affect pleasant  ECG: Sinus bradycardia 51 No ST-T wave abnormalities. Personally reviewed   Recent Labs: No results found for requested labs within last 365 days.  Personally reviewed   Wt Readings from Last 3 Encounters:  07/12/23 89.3 kg (196 lb 12.8 oz)  10/07/22 86.5 kg (190 lb 9.6 oz)  09/24/22 86.2 kg (190 lb)    ASSESSMENT AND PLAN:  1. CAD S/p CABG 2012 - Cath 04/2020 with stable revascularization. -No s/s angina - Continue statin and ASA.  2. Systolic HF with recovered ejection fraction - EF 40-45% on echo 8/21.  - Echo 7/22 EF 55-60% (probably closer to 50-55% by my read) - Echo today 07/12/23 EF 50-55% Personally reviewed - NHYA I. Volume ok  - Cotninue  Entresto 97/103 mg bid. - Continue spiro 25 mg daily. - Continue Toprol XL 25 mg daily. Heart rate limits titration.  - Continue Farxiga 10 mg daily.  3. HTN - SBP 135-145 at home. Continue to follow. - If SBP persistently > 140 can increase doxazosin to 4mg  daily  4. Carotid stenosis - CT scan (2/22) shows a 90% left carotid stenosis.  - L CEA 11/2020  - Carotid u/s R  40-59% following with [name]  - asymptomatic  5. HL - Continue crestor & zetia - Followed by Dr. Alfonse Alpers, MD  07/12/2023 10:36 AM  Advanced Heart Failure Clinic [facility] [address] [facility] [place] [address] [phone] (office) [phone] (fax)

## 2023-07-20 DIAGNOSIS — D1801 Hemangioma of skin and subcutaneous tissue: Secondary | ICD-10-CM | POA: Diagnosis not present

## 2023-07-20 DIAGNOSIS — D485 Neoplasm of uncertain behavior of skin: Secondary | ICD-10-CM | POA: Diagnosis not present

## 2023-07-20 DIAGNOSIS — C44319 Basal cell carcinoma of skin of other parts of face: Secondary | ICD-10-CM | POA: Diagnosis not present

## 2023-07-20 DIAGNOSIS — L814 Other melanin hyperpigmentation: Secondary | ICD-10-CM | POA: Diagnosis not present

## 2023-07-20 DIAGNOSIS — D0359 Melanoma in situ of other part of trunk: Secondary | ICD-10-CM | POA: Diagnosis not present

## 2023-07-20 DIAGNOSIS — L570 Actinic keratosis: Secondary | ICD-10-CM | POA: Diagnosis not present

## 2023-07-20 DIAGNOSIS — L821 Other seborrheic keratosis: Secondary | ICD-10-CM | POA: Diagnosis not present

## 2023-07-27 DIAGNOSIS — I2510 Atherosclerotic heart disease of native coronary artery without angina pectoris: Secondary | ICD-10-CM | POA: Diagnosis not present

## 2023-07-27 DIAGNOSIS — Z79899 Other long term (current) drug therapy: Secondary | ICD-10-CM | POA: Diagnosis not present

## 2023-07-27 DIAGNOSIS — I10 Essential (primary) hypertension: Secondary | ICD-10-CM | POA: Diagnosis not present

## 2023-07-27 DIAGNOSIS — E7800 Pure hypercholesterolemia, unspecified: Secondary | ICD-10-CM | POA: Diagnosis not present

## 2023-07-27 DIAGNOSIS — N401 Enlarged prostate with lower urinary tract symptoms: Secondary | ICD-10-CM | POA: Diagnosis not present

## 2023-07-27 DIAGNOSIS — D638 Anemia in other chronic diseases classified elsewhere: Secondary | ICD-10-CM | POA: Diagnosis not present

## 2023-08-04 ENCOUNTER — Other Ambulatory Visit (HOSPITAL_COMMUNITY): Payer: Self-pay | Admitting: Internal Medicine

## 2023-08-04 DIAGNOSIS — Z1331 Encounter for screening for depression: Secondary | ICD-10-CM | POA: Diagnosis not present

## 2023-08-04 DIAGNOSIS — R82998 Other abnormal findings in urine: Secondary | ICD-10-CM | POA: Diagnosis not present

## 2023-08-04 DIAGNOSIS — Z1339 Encounter for screening examination for other mental health and behavioral disorders: Secondary | ICD-10-CM | POA: Diagnosis not present

## 2023-08-23 DIAGNOSIS — D0359 Melanoma in situ of other part of trunk: Secondary | ICD-10-CM | POA: Diagnosis not present

## 2023-09-16 ENCOUNTER — Other Ambulatory Visit: Payer: Self-pay | Admitting: *Deleted

## 2023-09-16 DIAGNOSIS — I6523 Occlusion and stenosis of bilateral carotid arteries: Secondary | ICD-10-CM

## 2023-09-19 ENCOUNTER — Other Ambulatory Visit: Payer: Self-pay | Admitting: Internal Medicine

## 2023-09-19 DIAGNOSIS — E7849 Other hyperlipidemia: Secondary | ICD-10-CM

## 2023-09-19 DIAGNOSIS — I2510 Atherosclerotic heart disease of native coronary artery without angina pectoris: Secondary | ICD-10-CM

## 2023-09-21 DIAGNOSIS — C44319 Basal cell carcinoma of skin of other parts of face: Secondary | ICD-10-CM | POA: Diagnosis not present

## 2023-09-22 ENCOUNTER — Other Ambulatory Visit (HOSPITAL_COMMUNITY): Payer: Self-pay | Admitting: Internal Medicine

## 2023-09-23 ENCOUNTER — Ambulatory Visit (HOSPITAL_COMMUNITY)
Admission: RE | Admit: 2023-09-23 | Discharge: 2023-09-23 | Disposition: A | Discharge status: Home / Self Care | Payer: Medicare HMO | Source: Ambulatory Visit | Attending: Surgery | Admitting: Surgery

## 2023-09-23 DIAGNOSIS — I6523 Occlusion and stenosis of bilateral carotid arteries: Secondary | ICD-10-CM | POA: Diagnosis not present

## 2023-09-26 ENCOUNTER — Encounter (HOSPITAL_COMMUNITY): Payer: Medicare HMO

## 2023-09-26 ENCOUNTER — Encounter: Payer: Self-pay | Admitting: Surgery

## 2023-09-26 ENCOUNTER — Ambulatory Visit: Payer: Medicare HMO | Admitting: Surgery

## 2023-09-26 VITALS — BP 129/53 | HR 63 | Temp 98.1°F | Resp 20 | Ht 67.0 in | Wt 195.0 lb

## 2023-09-26 DIAGNOSIS — I6523 Occlusion and stenosis of bilateral carotid arteries: Secondary | ICD-10-CM | POA: Diagnosis not present

## 2023-09-26 NOTE — Progress Notes (Signed)
 [facility] [place]  Patient name: [name] MRN: [id number] DOB: [birth year] Sex: male   REASON FOR VISIT:    Follow up  HISOTRY OF PRESENT ILLNESS:    [name] is a 83 y.o. male who is back today for follow-up of his carotid disease.  He is status post left carotid endarterectomy on 11/21/2020 for asymptomatic stenosis.  I have been following his right carotid disease which is progressed to greater than 80% by duplex and 75% by CTA.  He remains asymptomatic.  Specifically, he denies any numbness or weakness in either extremity.  He denies slurred speech.  He denies amaurosis fugax.  He also does not have claudication symptoms.   Patient with history of coronary artery disease, status post CABG as well as PCI.  He is medically managed for hypertension.  He is on a statin for hypercholesterolemia.  He is also treated for congestive heart failure.   PAST MEDICAL HISTORY:   Past Medical History:  Diagnosis Date   Arthritis    hips   CAD (coronary artery disease)    s/p multiple PCIs to RCA dating back to 47. Last stent to proximal RCA in November of 2011. Also had 99% stenosis in the distal PDA with left to right collaterals and nonobstructive disease in the left coronary tree. ;  ISR of RCA 10/12 - CABG 10/9 with Dr. Fleeta Trigt: L-LAD, S-OM, S-PDA   Carotid bruit    carotid u/s 2/12: 40-59% bilaterally. (stable)   GERD (gastroesophageal reflux disease)    HTN (hypertension)    Hyperlipidemia      FAMILY HISTORY:   Family History  Problem Relation Age of Onset   Heart disease Father    Colon cancer Neg Hx    Stomach cancer Neg Hx     SOCIAL HISTORY:   Social History   Tobacco Use   Smoking status: Never   Smokeless tobacco: Never  Substance Use Topics   Alcohol  use: Yes    Alcohol /week: 6.0 standard drinks of alcohol     Types: 6 Cans of beer per week    Comment: weekly     ALLERGIES:    Allergies  Allergen Reactions   Morphine Nausea Only     CURRENT MEDICATIONS:   Current Outpatient Medications  Medication Sig Dispense Refill   acetaminophen  (TYLENOL ) 325 MG tablet Take 650 mg by mouth every 6 (six) hours as needed.     amLODipine  (NORVASC ) 10 MG tablet TAKE 1 TABLET ONCE DAILY. 90 tablet 3   amoxicillin  (AMOXIL ) 500 MG capsule take FOUR capsules ONE hour prior TO appt. 12 capsule 3   aspirin  81 MG chewable tablet Chew 81 mg by mouth every evening.     doxazosin  (CARDURA ) 2 MG tablet Take 1 tablet (2 mg total) by mouth daily. 30 tablet 11   ENTRESTO  97-103 MG TAKE ONE TABLET BY MOUTH TWICE DAILY 180 tablet 3   FARXIGA  10 MG TABS tablet Take 1 tablet (10 mg total) by mouth daily before breakfast. PLEASE CALL OFFICE TO ARRANGE YOUR 6 MONTH APPOINTMENT 90 tablet 11   metoprolol  succinate (TOPROL -XL) 25 MG 24 hr tablet TAKE 1 TABLET ONCE DAILY. 90 tablet 3   nitroGLYCERIN  (NITROSTAT ) 0.4 MG SL tablet 1 TAB UNDER TONGUE AS NEEDED FOR CHEST PAIN. MAY REPEAT EVERY 5 MIN FOR A TOTAL OF 3 DOSES. 25 tablet 0   pantoprazole  (PROTONIX ) 40 MG tablet TAKE ONE TABLET BY MOUTH ONCE DAILY. 90  tablet 3   REPATHA  SURECLICK 140 MG/ML SOAJ Inject 140 mg into the skin every 14 (fourteen) days. 6 mL 3   rosuvastatin (CRESTOR) 40 MG tablet Take 1 tablet by mouth daily.     spironolactone  (ALDACTONE ) 25 MG tablet TAKE 1 TABLET ONCE DAILY. 90 tablet 3   No current facility-administered medications for this visit.    REVIEW OF SYSTEMS:   [X]  denotes positive finding, [ ]  denotes negative finding Cardiac  Comments:  Chest pain or chest pressure:    Shortness of breath upon exertion:    Short of breath when lying flat:    Irregular heart rhythm:        Vascular    Pain in calf, thigh, or hip brought on by ambulation:    Pain in feet at night that wakes you up from your sleep:     Blood clot in your veins:    Leg swelling:         Pulmonary    Oxygen at home:    Productive cough:      Wheezing:         Neurologic    Sudden weakness in arms or legs:     Sudden numbness in arms or legs:     Sudden onset of difficulty speaking or slurred speech:    Temporary loss of vision in one eye:     Problems with dizziness:         Gastrointestinal    Blood in stool:     Vomited blood:         Genitourinary    Burning when urinating:     Blood in urine:        Psychiatric    Major depression:         Hematologic    Bleeding problems:    Problems with blood clotting too easily:        Skin    Rashes or ulcers:        Constitutional    Fever or chills:      PHYSICAL EXAM:   Vitals:   09/26/23 0923 09/26/23 0925  BP: 134/67 (!) 129/53  Pulse: 63   Resp: 20   Temp: 98.1 F (36.7 C)   SpO2: 95%   Weight: 195 lb (88.5 kg)   Height: 5' 7 (1.702 m)     GENERAL: The patient is a well-nourished male, in no acute distress. The vital signs are documented above. CARDIAC: There is a regular rate and rhythm.  VASCULAR: Right carotid bruit PULMONARY: Non-labored respirations ABDOMEN: Soft and non-tender MUSCULOSKELETAL: There are no major deformities or cyanosis. NEUROLOGIC: No focal weakness or paresthesias are detected. SKIN: There are no ulcers or rashes noted. PSYCHIATRIC: The patient has a normal affect.  STUDIES:   I have reviewed the following: Right Carotid: Velocities in the right ICA are consistent with a 40-59%                 stenosis. Calcific plaque may obscure higher velocity.   Left Carotid: Velocities in the left ICA are consistent with a 1-39%  stenosis.   Vertebrals: Bilateral vertebral arteries demonstrate antegrade flow.  Subclavians: Normal flow hemodynamics were seen in bilateral subclavian               arteries.   MEDICAL ISSUES:   Carotid: His initial CT angiogram showed a 75% stenosis on the right.  However, after proceeding with left carotid endarterectomy his ultrasound velocities have been  consistently in the 40-59%  range.  I have reevaluated his CT scan and think that the stenosis was overestimated.  I feel that his degree of stenosis is in the 60% range.  Since he is asymptomatic, no intervention is recommended.  He will continue with annual surveillance.    [name] Serene CLORE, MD, FACS [facility] [phone] Pager [phone]

## 2023-10-04 ENCOUNTER — Other Ambulatory Visit: Payer: Self-pay

## 2023-10-04 DIAGNOSIS — I6523 Occlusion and stenosis of bilateral carotid arteries: Secondary | ICD-10-CM

## 2023-10-13 ENCOUNTER — Other Ambulatory Visit (HOSPITAL_COMMUNITY): Payer: Self-pay | Admitting: Internal Medicine

## 2023-11-14 ENCOUNTER — Telehealth (HOSPITAL_COMMUNITY): Payer: Self-pay

## 2023-11-14 NOTE — Telephone Encounter (Signed)
 Advanced Heart Failure Patient Advocate Encounter  The patient was renewed for a [employer] grant that will help cover the cost of repatha.  Total amount awarded, $10,000.  Effective: 11/09/2023 - 11/07/2024.  BIN [id number] PCN [id number] Group [id number] ID [id number]  Pharmacy provided with approval and processing information. Confirmed $0 copay. Patient informed via phone.  Burnell Blanks, CPhT Rx Patient Advocate Phone: [phone]

## 2023-11-15 ENCOUNTER — Telehealth: Payer: Self-pay | Admitting: Pharmacist

## 2023-11-15 ENCOUNTER — Telehealth: Payer: Self-pay | Admitting: Pharmacy Technician

## 2023-11-15 NOTE — Telephone Encounter (Signed)
 Patient called and LVM stating that he needs to have his [employer] grant renewed for his Repatha

## 2023-11-15 NOTE — Telephone Encounter (Signed)
 Patient has an active grant that was just used at [facility] on 11/14/23.  [employer] ID [id number] Status Approved Sub Status Active Start Date 11/09/2023 End Date 11/07/2024 Card No. [id number] Card Status Active BIN [id number] PCN [id number] PC Group [id number]

## 2023-11-15 NOTE — Telephone Encounter (Signed)
 This was already used at [facility] on 11/14/23 [employer] ID [id number] Status Approved Sub Status Active Start Date 11/09/2023 End Date 11/07/2024 Card No. [id number] Card Status Active BIN [id number] PCN PXXPDMI PC Group [id number]

## 2023-11-16 NOTE — Telephone Encounter (Signed)
 Called and LVM for pt that his grant looks like its active and pharmacy used it. Let me know if he is still having issues

## 2023-12-12 ENCOUNTER — Encounter: Payer: Self-pay | Admitting: Pharmacy Technician

## 2023-12-12 ENCOUNTER — Telehealth: Payer: Self-pay | Admitting: Pharmacist

## 2023-12-12 ENCOUNTER — Telehealth: Payer: Self-pay | Admitting: Pharmacy Technician

## 2023-12-12 NOTE — Telephone Encounter (Signed)
 Patient left VM that Entresto/Farxiga grant wasn't working I renewed the grant Card No. [id number] Card Status Active BIN [id number] PCN PXXPDMI PC Group [id number]

## 2023-12-12 NOTE — Telephone Encounter (Signed)
 Hi! I called the pharmacy and they ran the grant with no problem and I called the patient and left him a message and sent him a mychart. Thank you! Olene Floss, RPH-CPP to Rx Med Assistance Team      12/12/23 11:42 AM Can you do me a huge favor and call this into the pharmacy and let patient know? Olene Floss, RPH-CPP     12/12/23 11:42 AM Note Patient left VM that Entresto/Farxiga grant wasn't working I renewed the grant Card No. [id number] Card Status Active BIN [id number] PCN PXXPDMI PC Group [id number]

## 2024-01-09 ENCOUNTER — Other Ambulatory Visit (HOSPITAL_COMMUNITY): Payer: Self-pay | Admitting: Internal Medicine

## 2024-02-09 DIAGNOSIS — Z85828 Personal history of other malignant neoplasm of skin: Secondary | ICD-10-CM | POA: Diagnosis not present

## 2024-02-09 DIAGNOSIS — L821 Other seborrheic keratosis: Secondary | ICD-10-CM | POA: Diagnosis not present

## 2024-02-09 DIAGNOSIS — Z85820 Personal history of malignant melanoma of skin: Secondary | ICD-10-CM | POA: Diagnosis not present

## 2024-02-09 DIAGNOSIS — L570 Actinic keratosis: Secondary | ICD-10-CM | POA: Diagnosis not present

## 2024-02-09 DIAGNOSIS — L814 Other melanin hyperpigmentation: Secondary | ICD-10-CM | POA: Diagnosis not present

## 2024-02-09 DIAGNOSIS — D225 Melanocytic nevi of trunk: Secondary | ICD-10-CM | POA: Diagnosis not present

## 2024-02-09 DIAGNOSIS — Z08 Encounter for follow-up examination after completed treatment for malignant neoplasm: Secondary | ICD-10-CM | POA: Diagnosis not present

## 2024-02-10 DIAGNOSIS — E7800 Pure hypercholesterolemia, unspecified: Secondary | ICD-10-CM | POA: Diagnosis not present

## 2024-02-10 DIAGNOSIS — I2510 Atherosclerotic heart disease of native coronary artery without angina pectoris: Secondary | ICD-10-CM | POA: Diagnosis not present

## 2024-03-05 ENCOUNTER — Other Ambulatory Visit (HOSPITAL_COMMUNITY): Payer: Self-pay | Admitting: Internal Medicine

## 2024-04-18 ENCOUNTER — Telehealth (HOSPITAL_COMMUNITY): Payer: Self-pay

## 2024-04-18 DIAGNOSIS — I2510 Atherosclerotic heart disease of native coronary artery without angina pectoris: Secondary | ICD-10-CM

## 2024-04-18 DIAGNOSIS — I5022 Chronic systolic (congestive) heart failure: Secondary | ICD-10-CM

## 2024-04-18 NOTE — Telephone Encounter (Signed)
 Patient called in reporting that he was told he needed to find a general cardiologist at his last OV. He would like to know if Dr. Cherrie has a specific cardiologist in mind that he would like patient to see. Advised patient I would forward message to him and will be back in touch. Patient verbalized understanding.

## 2024-04-18 NOTE — Telephone Encounter (Signed)
 Dr Cherrie recommends Dr Lonni, ref placed

## 2024-06-21 DIAGNOSIS — H04123 Dry eye syndrome of bilateral lacrimal glands: Secondary | ICD-10-CM | POA: Diagnosis not present

## 2024-06-21 DIAGNOSIS — H524 Presbyopia: Secondary | ICD-10-CM | POA: Diagnosis not present

## 2024-06-21 DIAGNOSIS — Z961 Presence of intraocular lens: Secondary | ICD-10-CM | POA: Diagnosis not present

## 2024-06-25 ENCOUNTER — Encounter (HOSPITAL_COMMUNITY): Payer: Self-pay | Admitting: Internal Medicine

## 2024-06-25 ENCOUNTER — Ambulatory Visit (HOSPITAL_COMMUNITY)
Admission: RE | Admit: 2024-06-25 | Discharge: 2024-06-25 | Disposition: A | Discharge status: Home / Self Care | Source: Ambulatory Visit | Attending: Internal Medicine | Admitting: Internal Medicine

## 2024-06-25 VITALS — BP 140/68 | HR 69 | Ht 67.0 in | Wt 200.0 lb

## 2024-06-25 DIAGNOSIS — Z7982 Long term (current) use of aspirin: Secondary | ICD-10-CM | POA: Insufficient documentation

## 2024-06-25 DIAGNOSIS — M79673 Pain in unspecified foot: Secondary | ICD-10-CM | POA: Insufficient documentation

## 2024-06-25 DIAGNOSIS — Z79899 Other long term (current) drug therapy: Secondary | ICD-10-CM | POA: Insufficient documentation

## 2024-06-25 DIAGNOSIS — I2510 Atherosclerotic heart disease of native coronary artery without angina pectoris: Secondary | ICD-10-CM

## 2024-06-25 DIAGNOSIS — R262 Difficulty in walking, not elsewhere classified: Secondary | ICD-10-CM | POA: Diagnosis not present

## 2024-06-25 DIAGNOSIS — I6522 Occlusion and stenosis of left carotid artery: Secondary | ICD-10-CM | POA: Insufficient documentation

## 2024-06-25 DIAGNOSIS — I6523 Occlusion and stenosis of bilateral carotid arteries: Secondary | ICD-10-CM | POA: Diagnosis not present

## 2024-06-25 DIAGNOSIS — M549 Dorsalgia, unspecified: Secondary | ICD-10-CM | POA: Insufficient documentation

## 2024-06-25 DIAGNOSIS — I10 Essential (primary) hypertension: Secondary | ICD-10-CM

## 2024-06-25 DIAGNOSIS — I25810 Atherosclerosis of coronary artery bypass graft(s) without angina pectoris: Secondary | ICD-10-CM | POA: Diagnosis not present

## 2024-06-25 DIAGNOSIS — Z7984 Long term (current) use of oral hypoglycemic drugs: Secondary | ICD-10-CM | POA: Insufficient documentation

## 2024-06-25 DIAGNOSIS — Z96642 Presence of left artificial hip joint: Secondary | ICD-10-CM | POA: Diagnosis not present

## 2024-06-25 DIAGNOSIS — E785 Hyperlipidemia, unspecified: Secondary | ICD-10-CM | POA: Diagnosis not present

## 2024-06-25 DIAGNOSIS — Z96641 Presence of right artificial hip joint: Secondary | ICD-10-CM | POA: Insufficient documentation

## 2024-06-25 DIAGNOSIS — I110 Hypertensive heart disease with heart failure: Secondary | ICD-10-CM | POA: Diagnosis not present

## 2024-06-25 DIAGNOSIS — I5022 Chronic systolic (congestive) heart failure: Secondary | ICD-10-CM | POA: Diagnosis not present

## 2024-06-25 DIAGNOSIS — I5042 Chronic combined systolic (congestive) and diastolic (congestive) heart failure: Secondary | ICD-10-CM | POA: Diagnosis not present

## 2024-06-25 NOTE — Progress Notes (Signed)
 Advanced Heart Failure Clinic Note   Date:  06/25/2024   ID:  [name], DOB 11-17-1940, MRN [id number]  Location: Home  Provider location: [place] [facility] Type of Visit: Established patient  PCP:  Tisovec, Charlie ORN, MD  Cardiologist:  None Primary HF: Alieah Brinton  Chief Complaint: Heart Failure    History of Present Illness:  [name] is a 83 y.o. male with history of CAD, multiple angioplasties to the RCA dating back to 1983 followed by CABG in 2012, HTN, and hyperlipidemia.  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    He was hospitalized in 11/11 because of recurrent chest pain. He underwent drug-eluting stent to the proximal RCA July 24, 2010. Ejection fraction was 65%. He also had 99% stenosis in the distal PDA with left to right collaterals and nonobstructive disease in the left coronary tree.   Admitted in October 2012 with [facility] . Found to have progressive CAD. Underwent Coronary artery bypass grafting x4 (left internal mammary artery to LAD, saphenous vein graft to circumflex marginal, saphenous vein graft to posterior descending branch of the distal right coronary, saphenous vein graft to the posterolateral branch of the distal right coronary artery).   Had recurrent CP in 8/21 underwent cath which showed stable anatomy no targets for PCI  1. 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 LAD 100% 3. RCA 100% 4. LCX 50% in mid LCX 5. LIMA to LAD widely patent 6. SVG to R PDA widely patent 7. SVG to OM occluded ostially 8. SVG to PL occluded ostially Has moderate disease in anomalous LCX but not crittcal and anatomy precludes PCI. 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 He was discharged 04/23/20. On 04/25/20 he had AMS was sent to [facility] ED. HS Trop 111-->1421 -> 17,390. It was unclear if this was demand ischemia. Had LHC on 04/28/20 with no targets for  revascularization and medical management. ECHO showed EF 40-45%.  Echo 7/22 EF 50-55%  Underwent LICA Carotid endarterectomy 3/22. Follows with Dr. Serene R carotid 40-59%  Echo 07/12/23 EF 50-55% Personally reviewed  Here for yearly f/u. Denies CP or SOB. [facility]. Struggling with his back. Does stretching, weights and 15 mins on exercise bike. Has trouble walking due to back and his foot. Gets HR 80-90 range on the bike. No edema, orthopnea or PND. No problems with meds. SBP 130-140 at home    Past Medical History:  Diagnosis Date   Arthritis    hips   CAD (coronary artery disease)    s/p multiple PCIs to RCA dating back to 88. Last stent to proximal RCA in November of 2011. Also had 99% stenosis in the distal PDA with left to right collaterals and nonobstructive disease in the left coronary tree. ;  ISR of RCA 10/12 - CABG 10/9 with Dr. Fleeta Trigt: L-LAD, S-OM, S-PDA   Carotid bruit    carotid u/s 2/12: 40-59% bilaterally. (stable)   GERD (gastroesophageal reflux disease)    HTN (hypertension)    Hyperlipidemia    Past Surgical History:  Procedure Laterality Date   CARDIAC CATHETERIZATION     COLONOSCOPY     Coronary artery bypass grafting x4 (left internal mammary artery  06/23/2011   CORONARY STENT PLACEMENT  Nov 2011   prox RCA. Has had prior multiple procedures.   CORONARY/GRAFT ANGIOGRAPHY  N/A 05/04/2019   Procedure: CORONARY/GRAFT ANGIOGRAPHY;  Surgeon: Cherrie Toribio SAUNDERS, MD;  Location: [facility] INVASIVE CV LAB;  Service: Cardiovascular;  Laterality: N/A;   ENDARTERECTOMY Left 11/21/2020   Procedure: LEFT CAROTID ENDARTERECTOMY;  Surgeon: Serene Gaile ORN, MD;  Location: [facility] OR;  Service: Vascular;  Laterality: Left;   INGUINAL HERNIA REPAIR Right 06/10/2020   Procedure: RIGHT INGUINAL HERNIA;  Surgeon: Vanderbilt Ned, MD;  Location: [facility] OR;  Service: General;  Laterality: Right;  GENERAL AND TAP BLOCK   INSERTION OF MESH Right 06/10/2020   Procedure:  INSERTION OF MESH;  Surgeon: Vanderbilt Ned, MD;  Location: [facility] OR;  Service: General;  Laterality: Right;   JOINT REPLACEMENT     LEFT HEART CATH AND CORS/GRAFTS ANGIOGRAPHY N/A 04/28/2020   Procedure: LEFT HEART CATH AND CORS/GRAFTS ANGIOGRAPHY;  Surgeon: [name] Dorn PARAS, MD;  Location: [facility] INVASIVE CV LAB;  Service: Cardiovascular;  Laterality: N/A;   left total hip replacement  2008   PATCH ANGIOPLASTY Left 11/21/2020   Procedure: PATCH ANGIOPLASTY;  Surgeon: Serene Gaile ORN, MD;  Location: [facility] OR;  Service: Vascular;  Laterality: Left;   TONSILLECTOMY     TOOTH EXTRACTION  06/2013   TOTAL HIP ARTHROPLASTY Right 04/22/2020   Procedure: TOTAL HIP ARTHROPLASTY;  Surgeon: [name] Chew, MD;  Location: [facility] ORS;  Service: Orthopedics;  Laterality: Right;     Current Outpatient Medications  Medication Sig Dispense Refill   acetaminophen  (TYLENOL ) 325 MG tablet Take 650 mg by mouth every 6 (six) hours as needed.     amLODipine  (NORVASC ) 10 MG tablet TAKE ONE TABLET ONCE DAILY 90 tablet 3   amoxicillin  (AMOXIL ) 500 MG capsule take FOUR capsules ONE hour prior TO appt. 12 capsule 3   aspirin  81 MG chewable tablet [name] 81 mg by mouth every evening. (Patient taking differently: [name] 81 mg by mouth every evening.)     doxazosin  (CARDURA ) 2 MG tablet Take 1 tablet (2 mg total) by mouth daily. 30 tablet 11   ENTRESTO  97-103 MG TAKE ONE TABLET BY MOUTH TWICE DAILY 180 tablet 11   FARXIGA  10 MG TABS tablet Take 1 tablet (10 mg total) by mouth daily before breakfast. PLEASE CALL OFFICE TO ARRANGE YOUR 6 MONTH APPOINTMENT 90 tablet 11   metoprolol  succinate (TOPROL -XL) 25 MG 24 hr tablet TAKE 1 TABLET ONCE DAILY. 90 tablet 3   nitroGLYCERIN  (NITROSTAT ) 0.4 MG SL tablet 1 TAB UNDER TONGUE AS NEEDED FOR CHEST PAIN. MAY REPEAT EVERY 5 MIN FOR A TOTAL OF 3 DOSES. 25 tablet 0   pantoprazole  (PROTONIX ) 40 MG tablet TAKE ONE TABLET BY MOUTH ONCE DAILY. 90 tablet 3   REPATHA  SURECLICK 140 MG/ML SOAJ Inject 140 mg  into the skin every 14 (fourteen) days. 6 mL 3   rosuvastatin (CRESTOR) 40 MG tablet Take 1 tablet by mouth daily.     spironolactone  (ALDACTONE ) 25 MG tablet TAKE 1 TABLET ONCE DAILY. 90 tablet 3   No current facility-administered medications for this encounter.    Allergies:   Morphine   Social History:  The patient  reports that he has never smoked. He has never used smokeless tobacco. He reports current alcohol  use of about 6.0 standard drinks of alcohol  per week. He reports that he does not use drugs.   Family History:  The patient's family history includes Heart disease in his father.   ROS:  Please see the history of present illness.   All other systems are personally reviewed and negative.   Vitals:  06/25/24 0956  BP: (!) 140/68  Pulse: 69  SpO2: 96%     Wt Readings from Last 3 Encounters:  06/25/24 90.7 kg (200 lb)  09/26/23 88.5 kg (195 lb)  07/12/23 89.3 kg (196 lb 12.8 oz)     Exam:   General:  Well appearing. No resp difficulty HEENT: normal Neck: supple. no JVD. Carotids 2+ bilat; soft R bruit. No lymphadenopathy or thryomegaly appreciated. Cor: PMI nondisplaced. Regular rate & rhythm. No rubs, gallops or murmurs. Lungs: clear Abdomen: soft, nontender, nondistended. No hepatosplenomegaly. No bruits or masses. Good bowel sounds. Extremities: no cyanosis, clubbing, rash, edema Neuro: alert & orientedx3, cranial nerves grossly intact. moves all 4 extremities w/o difficulty. Affect pleasant    ECG: Sinus bradycardia 51 No ST-T wave abnormalities. Personally reviewed   Recent Labs: No results found for requested labs within last 365 days.  Personally reviewed   Wt Readings from Last 3 Encounters:  06/25/24 90.7 kg (200 lb)  09/26/23 88.5 kg (195 lb)  07/12/23 89.3 kg (196 lb 12.8 oz)    ASSESSMENT AND PLAN:  1. CAD S/p CABG 2012 - Cath 04/2020 with stable revascularization. - No s/s angina - Continue statin and ASA. Lipids managed by PCP  2.  Systolic HF with recovered ejection fraction - EF 40-45% on echo 8/21.  - Echo 7/22 EF 55-60% (probably closer to 50-55% by my read) - Echo 07/12/23 EF 50-55% Personally reviewed - NYHA I Volume ok - Cotninue Entresto  97/103 mg bid. - Continue spiro 25 mg daily. - Continue Toprol  XL 25 mg daily. Heart rate limits titration.  - Continue Farxiga  10 mg daily. - Repeat echo 1 year   3. HTN - SBP 130-140 at home.  - He is having increase prostate issues. Will increase doxazosin  to 4mg  to see if it helps   4. Carotid stenosis - CT scan (2/22) shows a 90% left carotid stenosis.  - L CEA 11/2020  - Carotid u/s R  40-59% following with Dr. Serene. Has f/u 09/1924 - remains asymptomatic  5. HL - Continue crestor & zetia  - Followed by Dr. Tisovec  - Last LDL 55  Toribio Fuel, MD  06/25/2024 10:24 AM  Advanced Heart Failure Clinic [facility] [address] [facility] [place] [address] [phone] (office) [phone] (fax)

## 2024-06-25 NOTE — Patient Instructions (Signed)
 Great to see you today!!!  You have been referred to general cardiology (Dr Lonni), her office will call you for an appointment in 1 year

## 2024-06-25 NOTE — Addendum Note (Signed)
 Encounter addended by: Buell Powell HERO, RN on: 06/25/2024 10:36 AM  Actions taken: Clinical Note Signed

## 2024-07-11 ENCOUNTER — Other Ambulatory Visit (HOSPITAL_COMMUNITY): Payer: Self-pay | Admitting: Cardiology

## 2024-07-11 MED ORDER — DOXAZOSIN MESYLATE 4 MG PO TABS: 4.0000 mg | ORAL_TABLET | Freq: Every day | ORAL | 3 refills | Status: AC

## 2024-07-13 NOTE — Progress Notes (Addendum)
 [name]                                          MRN: [id number]   09/14/2024   The [facility]BCI Quality Team Specialist reviewed this patient medical record for the purposes of chart review for care gap closure. The following were reviewed: chart review for care gap closure-controlling blood pressure.    VBCI Quality Team

## 2024-07-31 ENCOUNTER — Other Ambulatory Visit (HOSPITAL_COMMUNITY): Payer: Self-pay | Admitting: Internal Medicine

## 2024-08-01 DIAGNOSIS — E7849 Other hyperlipidemia: Secondary | ICD-10-CM | POA: Diagnosis not present

## 2024-08-01 DIAGNOSIS — D649 Anemia, unspecified: Secondary | ICD-10-CM | POA: Diagnosis not present

## 2024-08-02 ENCOUNTER — Other Ambulatory Visit (HOSPITAL_COMMUNITY): Payer: Self-pay | Admitting: Internal Medicine

## 2024-08-07 DIAGNOSIS — I5022 Chronic systolic (congestive) heart failure: Secondary | ICD-10-CM | POA: Diagnosis not present

## 2024-08-07 DIAGNOSIS — E7800 Pure hypercholesterolemia, unspecified: Secondary | ICD-10-CM | POA: Diagnosis not present

## 2024-08-07 DIAGNOSIS — M1990 Unspecified osteoarthritis, unspecified site: Secondary | ICD-10-CM | POA: Diagnosis not present

## 2024-08-07 DIAGNOSIS — E663 Overweight: Secondary | ICD-10-CM | POA: Diagnosis not present

## 2024-08-07 DIAGNOSIS — Z0000 Encounter for general adult medical examination without abnormal findings: Secondary | ICD-10-CM | POA: Diagnosis not present

## 2024-08-07 DIAGNOSIS — R82998 Other abnormal findings in urine: Secondary | ICD-10-CM | POA: Diagnosis not present

## 2024-08-07 DIAGNOSIS — Z1339 Encounter for screening examination for other mental health and behavioral disorders: Secondary | ICD-10-CM | POA: Diagnosis not present

## 2024-08-07 DIAGNOSIS — I779 Disorder of arteries and arterioles, unspecified: Secondary | ICD-10-CM | POA: Diagnosis not present

## 2024-08-07 DIAGNOSIS — I110 Hypertensive heart disease with heart failure: Secondary | ICD-10-CM | POA: Diagnosis not present

## 2024-08-07 DIAGNOSIS — K4090 Unilateral inguinal hernia, without obstruction or gangrene, not specified as recurrent: Secondary | ICD-10-CM | POA: Diagnosis not present

## 2024-08-07 DIAGNOSIS — H9193 Unspecified hearing loss, bilateral: Secondary | ICD-10-CM | POA: Diagnosis not present

## 2024-08-07 DIAGNOSIS — D638 Anemia in other chronic diseases classified elsewhere: Secondary | ICD-10-CM | POA: Diagnosis not present

## 2024-08-07 DIAGNOSIS — Z1331 Encounter for screening for depression: Secondary | ICD-10-CM | POA: Diagnosis not present

## 2024-08-15 DIAGNOSIS — Z85820 Personal history of malignant melanoma of skin: Secondary | ICD-10-CM | POA: Diagnosis not present

## 2024-08-15 DIAGNOSIS — L814 Other melanin hyperpigmentation: Secondary | ICD-10-CM | POA: Diagnosis not present

## 2024-08-15 DIAGNOSIS — L821 Other seborrheic keratosis: Secondary | ICD-10-CM | POA: Diagnosis not present

## 2024-08-15 DIAGNOSIS — Z85828 Personal history of other malignant neoplasm of skin: Secondary | ICD-10-CM | POA: Diagnosis not present

## 2024-08-15 DIAGNOSIS — D1801 Hemangioma of skin and subcutaneous tissue: Secondary | ICD-10-CM | POA: Diagnosis not present

## 2024-08-15 DIAGNOSIS — Z08 Encounter for follow-up examination after completed treatment for malignant neoplasm: Secondary | ICD-10-CM | POA: Diagnosis not present

## 2024-08-20 ENCOUNTER — Other Ambulatory Visit: Payer: Self-pay | Admitting: Internal Medicine

## 2024-08-20 DIAGNOSIS — I2510 Atherosclerotic heart disease of native coronary artery without angina pectoris: Secondary | ICD-10-CM

## 2024-08-20 DIAGNOSIS — E7849 Other hyperlipidemia: Secondary | ICD-10-CM

## 2024-08-21 ENCOUNTER — Other Ambulatory Visit: Payer: Self-pay | Admitting: Surgery

## 2024-08-21 DIAGNOSIS — I6523 Occlusion and stenosis of bilateral carotid arteries: Secondary | ICD-10-CM

## 2024-08-24 DIAGNOSIS — M25551 Pain in right hip: Secondary | ICD-10-CM | POA: Diagnosis not present

## 2024-08-24 DIAGNOSIS — M545 Low back pain, unspecified: Secondary | ICD-10-CM | POA: Diagnosis not present

## 2024-08-28 DIAGNOSIS — M25551 Pain in right hip: Secondary | ICD-10-CM | POA: Diagnosis not present

## 2024-08-28 DIAGNOSIS — M545 Low back pain, unspecified: Secondary | ICD-10-CM | POA: Diagnosis not present

## 2024-09-19 ENCOUNTER — Other Ambulatory Visit (HOSPITAL_COMMUNITY): Payer: Self-pay | Admitting: Internal Medicine

## 2024-09-20 ENCOUNTER — Other Ambulatory Visit (HOSPITAL_COMMUNITY): Payer: Self-pay | Admitting: Internal Medicine

## 2024-09-27 ENCOUNTER — Other Ambulatory Visit (HOSPITAL_COMMUNITY): Payer: Self-pay

## 2024-10-01 ENCOUNTER — Ambulatory Visit (HOSPITAL_COMMUNITY)
Admission: RE | Admit: 2024-10-01 | Discharge: 2024-10-01 | Disposition: A | Discharge status: Home / Self Care | Source: Ambulatory Visit | Attending: Surgery | Admitting: Surgery

## 2024-10-01 ENCOUNTER — Ambulatory Visit: Admitting: Physician Assistant

## 2024-10-01 VITALS — BP 176/74 | Ht 66.0 in | Wt 201.5 lb

## 2024-10-01 DIAGNOSIS — I6523 Occlusion and stenosis of bilateral carotid arteries: Secondary | ICD-10-CM | POA: Diagnosis not present

## 2024-10-01 NOTE — Progress Notes (Signed)
 " Office Note     CC:  follow up Requesting Provider:  Tisovec, Richard W, MD  HPI: [name] is a 84 y.o. (09-03-1941) male who presents for surveillance of carotid artery stenosis.  Surgical history significant for left carotid endarterectomy on 11/21/2020 for asymptomatic stenosis by Dr. Serene.  CTA demonstrated 75% stenosis of the right ICA however velocities have been in the 40 to 59% range.  This has been followed annually.  He denies any diagnosis of CVA or TIA since last office visit.  He also denies any strokelike symptoms including slurring speech, changes in vision, or one-sided weakness.  Past medical history also significant for coronary artery disease with history of CABG by Dr. Fleeta Ochoa.   Past Medical History:  Diagnosis Date   Arthritis    hips   CAD (coronary artery disease)    s/p multiple PCIs to RCA dating back to 58. Last stent to proximal RCA in November of 2011. Also had 99% stenosis in the distal PDA with left to right collaterals and nonobstructive disease in the left coronary tree. ;  ISR of RCA 10/12 - CABG 10/9 with Dr. Fleeta Trigt: L-LAD, S-OM, S-PDA   Carotid bruit    carotid u/s 2/12: 40-59% bilaterally. (stable)   GERD (gastroesophageal reflux disease)    HTN (hypertension)    Hyperlipidemia     Past Surgical History:  Procedure Laterality Date   CARDIAC CATHETERIZATION     COLONOSCOPY     Coronary artery bypass grafting x4 (left internal mammary artery  06/23/2011   CORONARY STENT PLACEMENT  Nov 2011   prox RCA. Has had prior multiple procedures.   CORONARY/GRAFT ANGIOGRAPHY N/A 05/04/2019   Procedure: CORONARY/GRAFT ANGIOGRAPHY;  Surgeon: Cherrie Toribio SAUNDERS, MD;  Location: [facility] INVASIVE CV LAB;  Service: Cardiovascular;  Laterality: N/A;   ENDARTERECTOMY Left 11/21/2020   Procedure: LEFT CAROTID ENDARTERECTOMY;  Surgeon: Serene Gaile ORN, MD;  Location: [facility] OR;  Service: Vascular;  Laterality: Left;   INGUINAL HERNIA REPAIR Right 06/10/2020    Procedure: RIGHT INGUINAL HERNIA;  Surgeon: Vanderbilt Ned, MD;  Location: [facility] OR;  Service: General;  Laterality: Right;  GENERAL AND TAP BLOCK   INSERTION OF MESH Right 06/10/2020   Procedure: INSERTION OF MESH;  Surgeon: Vanderbilt Ned, MD;  Location: [facility] OR;  Service: General;  Laterality: Right;   JOINT REPLACEMENT     LEFT HEART CATH AND CORS/GRAFTS ANGIOGRAPHY N/A 04/28/2020   Procedure: LEFT HEART CATH AND CORS/GRAFTS ANGIOGRAPHY;  Surgeon: Court Dorn PARAS, MD;  Location: [facility] INVASIVE CV LAB;  Service: Cardiovascular;  Laterality: N/A;   left total hip replacement  2008   PATCH ANGIOPLASTY Left 11/21/2020   Procedure: PATCH ANGIOPLASTY;  Surgeon: Serene Gaile ORN, MD;  Location: [facility] OR;  Service: Vascular;  Laterality: Left;   TONSILLECTOMY     TOOTH EXTRACTION  06/2013   TOTAL HIP ARTHROPLASTY Right 04/22/2020   Procedure: TOTAL HIP ARTHROPLASTY;  Surgeon: Josefina Chew, MD;  Location: [facility] ORS;  Service: Orthopedics;  Laterality: Right;    Social History   Socioeconomic History   Marital status: Married    Spouse name: Not on file   Number of children: Not on file   Years of education: Not on file   Highest education level: Not on file  Occupational History   Not on file  Tobacco Use   Smoking status: Never   Smokeless tobacco: Never  Vaping Use   Vaping status: Never Used  Substance and Sexual  Activity   Alcohol  use: Yes    Alcohol /week: 6.0 standard drinks of alcohol     Types: 6 Cans of beer per week    Comment: weekly   Drug use: No   Sexual activity: Not on file  Other Topics Concern   Not on file  Social History Narrative   Not on file   Social Drivers of Health   Tobacco Use: Low Risk (10/01/2024)   Patient History    Smoking Tobacco Use: Never    Smokeless Tobacco Use: Never    Passive Exposure: Not on file  Financial Resource Strain: Not on file  Food Insecurity: Not on file  Transportation Needs: Not on file  Physical Activity: Not on file  Stress:  Not on file  Social Connections: Not on file  Intimate Partner Violence: Not on file  Depression (EYV7-0): Not on file  Alcohol  Screen: Not on file  Housing: Not on file  Utilities: Not on file  Health Literacy: Not on file    Family History  Problem Relation Age of Onset   Heart disease Father    Colon cancer Neg Hx    Stomach cancer Neg Hx     Current Outpatient Medications  Medication Sig Dispense Refill   acetaminophen  (TYLENOL ) 325 MG tablet Take 650 mg by mouth every 6 (six) hours as needed.     amLODipine  (NORVASC ) 10 MG tablet TAKE ONE TABLET ONCE DAILY 90 tablet 3   amoxicillin  (AMOXIL ) 500 MG capsule take FOUR capsules ONE hour prior TO appt. 12 capsule 3   aspirin  81 MG chewable tablet Chew 81 mg by mouth every evening.     doxazosin  (CARDURA ) 4 MG tablet Take 1 tablet (4 mg total) by mouth daily. 90 tablet 3   ENTRESTO  97-103 MG TAKE ONE TABLET BY MOUTH TWICE DAILY 180 tablet 11   FARXIGA  10 MG TABS tablet Take 1 tablet (10 mg total) by mouth daily before breakfast. PLEASE CALL OFFICE TO ARRANGE YOUR 6 MONTH APPOINTMENT 90 tablet 11   metoprolol  succinate (TOPROL -XL) 25 MG 24 hr tablet TAKE ONE TABLET BY MOUTH ONCE DAILY 90 tablet 3   nitroGLYCERIN  (NITROSTAT ) 0.4 MG SL tablet DISSOLVE 1 TABLET UNDER THE TONGUE EVERY 5 MINUTES AS NEEDED FOR CHEST PAIN. DO NOT EXCEED A TOTAL OF 3 DOSES IN 15 MINUTES. 25 tablet 0   pantoprazole  (PROTONIX ) 40 MG tablet TAKE ONE TABLET BY MOUTH ONCE DAILY. 90 tablet 3   REPATHA  SURECLICK 140 MG/ML SOAJ Inject 140 mg into the skin every 14 (fourteen) days. 6 mL 3   rosuvastatin (CRESTOR) 40 MG tablet Take 1 tablet by mouth daily.     spironolactone  (ALDACTONE ) 25 MG tablet TAKE 1 TABLET ONCE DAILY. 90 tablet 3   No current facility-administered medications for this visit.    Allergies[1]   REVIEW OF SYSTEMS:  Negative unless noted in HPI [X]  denotes positive finding, [ ]  denotes negative finding Cardiac  Comments:  Chest pain or  chest pressure:    Shortness of breath upon exertion:    Short of breath when lying flat:    Irregular heart rhythm:        Vascular    Pain in calf, thigh, or hip brought on by ambulation:    Pain in feet at night that wakes you up from your sleep:     Blood clot in your veins:    Leg swelling:         Pulmonary    Oxygen at home:  Productive cough:     Wheezing:         Neurologic    Sudden weakness in arms or legs:     Sudden numbness in arms or legs:     Sudden onset of difficulty speaking or slurred speech:    Temporary loss of vision in one eye:     Problems with dizziness:         Gastrointestinal    Blood in stool:     Vomited blood:         Genitourinary    Burning when urinating:     Blood in urine:        Psychiatric    Major depression:         Hematologic    Bleeding problems:    Problems with blood clotting too easily:        Skin    Rashes or ulcers:        Constitutional    Fever or chills:      PHYSICAL EXAMINATION:  Vitals:   10/01/24 0948 10/01/24 0949  BP: (!) 165/71 (!) 176/74  Weight: 201 lb 8 oz (91.4 kg)   Height: 5' 6 (1.676 m)     General:  WDWN in NAD; vital signs documented above Gait: Not observed HENT: WNL, normocephalic Pulmonary: normal non-labored breathing Cardiac: regular HR Abdomen: soft, NT, no masses Skin: without rashes Vascular Exam/Pulses: Symmetrical radial pulses Extremities: without ischemic changes, without Gangrene , without cellulitis; without open wounds;  Musculoskeletal: no muscle wasting or atrophy  Neurologic: A&O X 3 Psychiatric:  The pt has Normal affect.   Non-Invasive Vascular Imaging:   Right ICA 40 to 59% stenosis Left ICA 1 to 39% stenosis    ASSESSMENT/PLAN:: 84 y.o. male here for follow up for surveillance of carotid artery stenosis  Subjectively, Mr. Stupka has been doing well since last office visit.  Carotid duplex is stable demonstrating 40 to 59% stenosis of the right ICA.   Left carotid endarterectomy site is widely patent without any recurrent stenosis.  He will continue his aspirin  and statin daily.  He is also on Repatha .  We will continue to follow his asymptomatic right ICA stenosis on an annual basis with ultrasound.  He will notify the office with any questions or concerns.   Donnice Sender, PA-C [facility] [phone]  Clinic MD:   Serene     [1]  Allergies Allergen Reactions   Morphine Nausea Only   "

## 2024-10-19 ENCOUNTER — Other Ambulatory Visit (HOSPITAL_COMMUNITY): Payer: Self-pay | Admitting: Internal Medicine
